# Patient Record
Sex: Male | Born: 1955 | Race: Black or African American | Hispanic: No | Marital: Married | Smoking: Former smoker
Health system: Southern US, Community
[De-identification: ages and names within clinical notes are randomized; demographics above are authoritative.]

## PROBLEM LIST (undated history)

## (undated) DIAGNOSIS — I10 Essential (primary) hypertension: Secondary | ICD-10-CM

## (undated) DIAGNOSIS — Z860100 Personal history of colon polyps, unspecified: Secondary | ICD-10-CM

## (undated) DIAGNOSIS — N529 Male erectile dysfunction, unspecified: Secondary | ICD-10-CM

## (undated) DIAGNOSIS — G4700 Insomnia, unspecified: Secondary | ICD-10-CM

## (undated) DIAGNOSIS — E785 Hyperlipidemia, unspecified: Secondary | ICD-10-CM

## (undated) HISTORY — DX: Hyperlipidemia, unspecified: E78.5

## (undated) HISTORY — DX: Male erectile dysfunction, unspecified: N52.9

## (undated) HISTORY — DX: Insomnia, unspecified: G47.00

## (undated) HISTORY — DX: Essential (primary) hypertension: I10

---

## 2010-10-26 ENCOUNTER — Emergency Department: Payer: Self-pay | Admitting: Emergency Medicine

## 2015-03-06 ENCOUNTER — Encounter: Payer: Self-pay | Admitting: Family Medicine

## 2015-03-06 ENCOUNTER — Ambulatory Visit (INDEPENDENT_AMBULATORY_CARE_PROVIDER_SITE_OTHER): Payer: 59 | Admitting: Family Medicine

## 2015-03-06 VITALS — BP 140/80 | HR 99 | Temp 98.3°F | Resp 18 | Ht 73.0 in | Wt 180.0 lb

## 2015-03-06 DIAGNOSIS — R05 Cough: Secondary | ICD-10-CM

## 2015-03-06 DIAGNOSIS — R058 Other specified cough: Secondary | ICD-10-CM | POA: Insufficient documentation

## 2015-03-06 MED ORDER — GUAIFENESIN-CODEINE 100-10 MG/5ML PO SYRP: 10.0000 mL | ORAL_SOLUTION | Freq: Every evening | ORAL | Status: DC | PRN

## 2015-03-06 MED ORDER — AZITHROMYCIN 250 MG PO TABS: ORAL_TABLET | ORAL | Status: DC

## 2015-03-06 NOTE — Progress Notes (Signed)
Name: Charles Valentine   MRN: [id number]    DOB: 02-04-1956   Date:03/06/2015       Progress Note  Subjective  Chief Complaint  Chief Complaint  Patient presents with  . Acute Visit    Upper Respiratory (Dr. Rutherford Nail Pt)    Cough This is a new problem. The current episode started in the past 7 days. The problem has been unchanged. The cough is productive of sputum. Associated symptoms include nasal congestion. Pertinent negatives include no chest pain, chills, ear congestion, fever or sore throat. Treatments tried: Robitussin COngestion and COugh, Alka-Seltzer Cold, and Mucinex, The treatment provided mild relief. There is no history of COPD.    History reviewed. No pertinent past medical history.  History reviewed. No pertinent past surgical history.  History reviewed. No pertinent family history.  Social History   Social History  . Marital Status: Married    Spouse Name: N/A  . Number of Children: N/A  . Years of Education: N/A   Occupational History  . Not on file.   Social History Main Topics  . Smoking status: Never Smoker   . Smokeless tobacco: Never Used  . Alcohol Use: 0.0 oz/week    0 Standard drinks or equivalent per week     Comment: occasional  . Drug Use: No  . Sexual Activity: No   Other Topics Concern  . Not on file   Social History Narrative  . No narrative on file     Current outpatient prescriptions:  .  CIALIS 20 MG tablet, , Disp: , Rfl: 1  No Known Allergies   Review of Systems  Constitutional: Negative for fever and chills.  HENT: Positive for congestion. Negative for sore throat.   Respiratory: Positive for cough and sputum production.   Cardiovascular: Negative for chest pain.    Objective  Filed Vitals:   03/06/15 1054  BP: 140/80  Pulse: 99  Temp: 98.3 F (36.8 C)  TempSrc: Oral  Resp: 18  Height: 6\' 1"  (1.854 m)  Weight: 180 lb (81.647 kg)  SpO2: 98%    Physical Exam  Constitutional: He is well-developed,  well-nourished, and in no distress.  HENT:  Head: Normocephalic and atraumatic.  Right Ear: Tympanic membrane and ear canal normal.  Left Ear: Tympanic membrane and ear canal normal.  Nose: Right sinus exhibits no maxillary sinus tenderness and no frontal sinus tenderness. Left sinus exhibits no maxillary sinus tenderness and no frontal sinus tenderness.  Mouth/Throat: Mucous membranes are normal. No posterior oropharyngeal erythema.  Cardiovascular: Normal rate, regular rhythm and normal heart sounds.   No murmur heard. Pulmonary/Chest: Effort normal and breath sounds normal. He has no wheezes.  Nursing note and vitals reviewed.   Assessment & Plan  1. Productive cough Likely resolving bronchitis. Given the duration of symptoms, we will start antibiotic therapy. Started on Cheratussin for relief from coughing at night. - azithromycin (ZITHROMAX Z-PAK) 250 MG tablet; 2 tabs po day 1, then 1 tab po q day x 4 days  Dispense: 6 each; Refill: 0 - guaiFENesin-codeine (CHERATUSSIN AC) 100-10 MG/5ML syrup; Take 10 mLs by mouth at bedtime as needed for cough or congestion.  Dispense: 50 mL; Refill: 0   Madalen Gavin Asad A[facility] 03/06/2015 11:09 AM

## 2015-03-13 ENCOUNTER — Encounter: Payer: Self-pay | Admitting: Family Medicine

## 2015-03-13 ENCOUNTER — Ambulatory Visit (INDEPENDENT_AMBULATORY_CARE_PROVIDER_SITE_OTHER): Payer: 59 | Admitting: Family Medicine

## 2015-03-13 VITALS — BP 122/70 | HR 68 | Temp 97.2°F | Resp 18 | Ht 73.0 in | Wt 178.1 lb

## 2015-03-13 DIAGNOSIS — Z0000 Encounter for general adult medical examination without abnormal findings: Secondary | ICD-10-CM

## 2015-03-13 DIAGNOSIS — Z1211 Encounter for screening for malignant neoplasm of colon: Secondary | ICD-10-CM | POA: Diagnosis not present

## 2015-03-13 MED ORDER — CIALIS 20 MG PO TABS: 20.0000 mg | ORAL_TABLET | Freq: Every day | ORAL | Status: DC | PRN

## 2015-03-13 MED ORDER — ZOLPIDEM TARTRATE 10 MG PO TABS: 10.0000 mg | ORAL_TABLET | Freq: Every evening | ORAL | Status: DC | PRN

## 2015-03-13 NOTE — Progress Notes (Signed)
Name: Charles Valentine   MRN: [id number]    DOB: 05-03-1955   Date:03/13/2015       Progress Note  Subjective  Chief Complaint  Chief Complaint  Patient presents with  . Annual Exam    HPI  59 year old presenting for H&P. Baseline problems are stable  History reviewed. No pertinent past medical history.  Social History  Substance Use Topics  . Smoking status: Never Smoker   . Smokeless tobacco: Never Used  . Alcohol Use: 0.0 oz/week    0 Standard drinks or equivalent per week     Comment: occasional     Current outpatient prescriptions:  .  CIALIS 20 MG tablet, , Disp: , Rfl: 1 .  azithromycin (ZITHROMAX Z-PAK) 250 MG tablet, 2 tabs po day 1, then 1 tab po q day x 4 days (Patient not taking: Reported on 03/13/2015), Disp: 6 each, Rfl: 0 .  guaiFENesin-codeine (CHERATUSSIN AC) 100-10 MG/5ML syrup, Take 10 mLs by mouth at bedtime as needed for cough or congestion. (Patient not taking: Reported on 03/13/2015), Disp: 50 mL, Rfl: 0  No Known Allergies  Review of Systems  Constitutional: Negative for fever, chills and weight loss.  HENT: Negative for congestion, hearing loss, sore throat and tinnitus.   Eyes: Negative for blurred vision, double vision and redness.  Respiratory: Negative for cough, hemoptysis and shortness of breath.   Cardiovascular: Negative for chest pain, palpitations, orthopnea, claudication and leg swelling.  Gastrointestinal: Negative for heartburn, nausea, vomiting, diarrhea, constipation and blood in stool.  Genitourinary: Negative for dysuria, urgency, frequency and hematuria.  Musculoskeletal: Negative for myalgias, back pain, joint pain, falls and neck pain.  Skin: Negative for itching.  Neurological: Negative for dizziness, tingling, tremors, focal weakness, seizures, loss of consciousness, weakness and headaches.  Endo/Heme/Allergies: Does not bruise/bleed easily.  Psychiatric/Behavioral: Negative for depression and substance abuse. The patient  is not nervous/anxious and does not have insomnia.      Objective  Filed Vitals:   03/13/15 0935  BP: 122/70  Pulse: 68  Temp: 97.2 F (36.2 C)  TempSrc: Oral  Resp: 18  Height: 6\' 1"  (1.854 m)  Weight: 178 lb 1.6 oz (80.786 kg)  SpO2: 97%     Physical Exam  Constitutional: He is oriented to person, place, and time and well-developed, well-nourished, and in no distress.  HENT:  Head: Normocephalic.  Eyes: EOM are normal. Pupils are equal, round, and reactive to light.  Neck: Normal range of motion. Neck supple. No thyromegaly present.  Cardiovascular: Normal rate, regular rhythm and normal heart sounds.   No murmur heard. Pulmonary/Chest: Effort normal and breath sounds normal. No respiratory distress. He has no wheezes.  Abdominal: Soft. Bowel sounds are normal.  Genitourinary: Rectum normal, prostate normal and penis normal. Guaiac negative stool. No discharge found.  Musculoskeletal: Normal range of motion. He exhibits no edema.  Lymphadenopathy:    He has no cervical adenopathy.  Neurological: He is alert and oriented to person, place, and time. No cranial nerve deficit. Gait normal. Coordination normal.  Skin: Skin is warm and dry. No rash noted.  Psychiatric: Affect and judgment normal.      Assessment & Plan    1. Annual physical exam  - CBC with Differential/Platelet; Future - Comprehensive metabolic panel - Lipid panel - PSA; Future - TSH - POC Hemoccult Bld/Stl (1-Cd Office Dx)  2. Colon cancer screening His colonoscopy  - Ambulatory referral to Colorectal Surgery

## 2015-03-14 LAB — COMPREHENSIVE METABOLIC PANEL
A/G RATIO: 1.2 (ref 1.1–2.5)
ALT: 25 IU/L (ref 0–44)
AST: 21 IU/L (ref 0–40)
Albumin: 4.6 g/dL (ref 3.5–5.5)
Alkaline Phosphatase: 81 IU/L (ref 39–117)
BILIRUBIN TOTAL: 0.3 mg/dL (ref 0.0–1.2)
BUN/Creatinine Ratio: 13 (ref 9–20)
BUN: 12 mg/dL (ref 6–24)
CALCIUM: 9.9 mg/dL (ref 8.7–10.2)
CHLORIDE: 99 mmol/L (ref 97–106)
CO2: 23 mmol/L (ref 18–29)
Creatinine, Ser: 0.9 mg/dL (ref 0.76–1.27)
GFR, EST AFRICAN AMERICAN: 109 mL/min/{1.73_m2} (ref >59)
GFR, EST NON AFRICAN AMERICAN: 94 mL/min/{1.73_m2} (ref >59)
GLOBULIN, TOTAL: 3.7 g/dL (ref 1.5–4.5)
Glucose: 89 mg/dL (ref 65–99)
POTASSIUM: 5.1 mmol/L (ref 3.5–5.2)
SODIUM: 138 mmol/L (ref 136–144)
TOTAL PROTEIN: 8.3 g/dL (ref 6.0–8.5)

## 2015-03-14 LAB — LIPID PANEL
CHOL/HDL RATIO: 4 ratio (ref 0.0–5.0)
Cholesterol, Total: 203 mg/dL — ABNORMAL HIGH (ref 100–199)
HDL: 51 mg/dL (ref >39)
LDL Calculated: 136 mg/dL — ABNORMAL HIGH (ref 0–99)
Triglycerides: 81 mg/dL (ref 0–149)
VLDL Cholesterol Cal: 16 mg/dL (ref 5–40)

## 2015-03-14 LAB — TSH: TSH: 1.17 u[IU]/mL (ref 0.450–4.500)

## 2015-03-20 ENCOUNTER — Telehealth: Payer: Self-pay | Admitting: Emergency Medicine

## 2015-03-20 MED ORDER — CIALIS 20 MG PO TABS: 20.0000 mg | ORAL_TABLET | Freq: Every day | ORAL | Status: DC | PRN

## 2015-03-20 NOTE — Telephone Encounter (Signed)
Patient notified

## 2016-03-18 ENCOUNTER — Encounter: Payer: 59 | Admitting: Family Medicine

## 2016-04-07 ENCOUNTER — Ambulatory Visit: Payer: 59 | Admitting: Family Medicine

## 2016-04-08 ENCOUNTER — Ambulatory Visit (INDEPENDENT_AMBULATORY_CARE_PROVIDER_SITE_OTHER): Payer: 59 | Admitting: Family Medicine

## 2016-04-08 ENCOUNTER — Encounter: Payer: Self-pay | Admitting: Family Medicine

## 2016-04-08 VITALS — BP 138/68 | HR 84 | Temp 98.8°F | Resp 16 | Ht 73.0 in | Wt 177.4 lb

## 2016-04-08 DIAGNOSIS — E785 Hyperlipidemia, unspecified: Secondary | ICD-10-CM

## 2016-04-08 DIAGNOSIS — N528 Other male erectile dysfunction: Secondary | ICD-10-CM | POA: Diagnosis not present

## 2016-04-08 DIAGNOSIS — E7800 Pure hypercholesterolemia, unspecified: Secondary | ICD-10-CM | POA: Insufficient documentation

## 2016-04-08 DIAGNOSIS — N529 Male erectile dysfunction, unspecified: Secondary | ICD-10-CM | POA: Insufficient documentation

## 2016-04-08 DIAGNOSIS — F5101 Primary insomnia: Secondary | ICD-10-CM

## 2016-04-08 MED ORDER — CIALIS 20 MG PO TABS: 20.0000 mg | ORAL_TABLET | Freq: Every day | ORAL | 11 refills | Status: DC | PRN

## 2016-04-08 MED ORDER — ASPIRIN EC 81 MG PO TBEC: 81.0000 mg | DELAYED_RELEASE_TABLET | Freq: Every day | ORAL | 0 refills | Status: DC

## 2016-04-08 MED ORDER — THERA VITAL M PO TABS: 1.0000 | ORAL_TABLET | Freq: Every day | ORAL | 0 refills | Status: DC

## 2016-04-08 MED ORDER — ZOLPIDEM TARTRATE 10 MG PO TABS: 10.0000 mg | ORAL_TABLET | Freq: Every evening | ORAL | 0 refills | Status: DC | PRN

## 2016-04-08 NOTE — Progress Notes (Signed)
Name: Charles Valentine   MRN: [id number]    DOB: 1955-05-10   Date:04/08/2016       Progress Note  Subjective  Chief Complaint  Chief Complaint  Patient presents with  . Medication Refill    needs cialis  . Hyperlipidemia    HPI  ED: he states he has been taking Cialis for years, not for every intercourse. He usually does better when exercising and eating healthy. It helps having a better erection and lasts longer during intercourse. No side effects of medication  Insomnia: He is only taking Ambien seldom to fall and stay asleep, wife snores, 30 pills lasts him one year. No side effects , including no amnesia.  Hyperlipidemia: he is only on diet, tries to eat healthy and exercise, no chest pain or decrease in exercise tolerance.   Patient Active Problem List   Diagnosis Date Noted  . Hyperlipidemia LDL goal <130 04/08/2016  . ED (erectile dysfunction)     History reviewed. No pertinent surgical history.  Family History  Problem Relation Age of Onset  . Hypertension Mother   . Diabetes Mother   . Heart disease Mother   . Obesity Sister     Social History   Social History  . Marital status: Married    Spouse name: Vanita Ingles  . Number of children: 2  . Years of education: N/A   Occupational History  . machine operator [employer]   Social History Main Topics  . Smoking status: Former Smoker    Packs/day: 0.50    Years: 10.00    Types: Cigarettes    Quit date: 04/20/1997  . Smokeless tobacco: Never Used  . Alcohol use 0.0 oz/week     Comment: occasional  . Drug use: No  . Sexual activity: Yes   Other Topics Concern  . Not on file   Social History Narrative   Married    He has two grown son's   Works as Radio producer      Current Outpatient Prescriptions:  .  CIALIS 20 MG tablet, Take 1 tablet (20 mg total) by mouth daily as needed for erectile dysfunction., Disp: 6 tablet, Rfl: 11 .  zolpidem (AMBIEN) 10 MG tablet, Take 1 tablet (10 mg total)  by mouth at bedtime as needed for sleep., Disp: 30 tablet, Rfl: 2  No Known Allergies   ROS  Constitutional: Negative for fever or weight change.  Respiratory: Negative for cough and shortness of breath.   Cardiovascular: Negative for chest pain or palpitations.  Gastrointestinal: Negative for abdominal pain, no bowel changes.  Musculoskeletal: Negative for gait problem or joint swelling.  Skin: Negative for rash.  Neurological: Negative for dizziness or headache.  No other specific complaints in a complete review of systems (except as listed in HPI above).  Objective  Vitals:   04/08/16 0944  BP: 138/68  Pulse: 84  Resp: 16  Temp: 98.8 F (37.1 C)  TempSrc: Oral  SpO2: 98%  Weight: 177 lb 7 oz (80.5 kg)  Height: 6\' 1"  (1.854 m)    Body mass index is 23.41 kg/m.  Physical Exam  Constitutional: Patient appears well-developed and well-nourished. No distress.  HEENT: head atraumatic, normocephalic, pupils equal and reactive to light, neck supple, throat within normal limits Cardiovascular: Normal rate, regular rhythm and normal heart sounds.  No murmur heard. No BLE edema. Pulmonary/Chest: Effort normal and breath sounds normal. No respiratory distress. Abdominal: Soft.  There is no tenderness. Psychiatric: Patient has a normal mood and  affect. behavior is normal. Judgment and thought content normal.  PHQ2/9: Depression screen [facility] 2/9 04/08/2016 03/13/2015 03/06/2015  Decreased Interest 0 0 0  Down, Depressed, Hopeless 0 0 0  PHQ - 2 Score 0 0 0     Fall Risk: Fall Risk  04/08/2016 03/13/2015 03/06/2015  Falls in the past year? No No No     Functional Status Survey: Is the patient deaf or have difficulty hearing?: No Does the patient have difficulty seeing, even when wearing glasses/contacts?: No Does the patient have difficulty concentrating, remembering, or making decisions?: No Does the patient have difficulty walking or climbing stairs?: No Does the  patient have difficulty dressing or bathing?: No Does the patient have difficulty doing errands alone such as visiting a doctor's office or shopping?: No   Assessment & Plan  1. Other male erectile dysfunction  - CIALIS 20 MG tablet; Take 1 tablet (20 mg total) by mouth daily as needed for erectile dysfunction.  Dispense: 6 tablet; Refill: 11 Discussed importance of telling EMS if he has chest pain and has taken Cialis  2. Hyperlipidemia LDL goal <130  Recheck on his CPE in Feb  3. Primary insomnia  - zolpidem (AMBIEN) 10 MG tablet; Take 1 tablet (10 mg total) by mouth at bedtime as needed for sleep.  Dispense: 30 tablet; Refill: 0

## 2016-06-08 ENCOUNTER — Ambulatory Visit (INDEPENDENT_AMBULATORY_CARE_PROVIDER_SITE_OTHER): Payer: 59 | Admitting: Family Medicine

## 2016-06-08 ENCOUNTER — Encounter: Payer: Self-pay | Admitting: Family Medicine

## 2016-06-08 VITALS — BP 134/78 | HR 79 | Temp 98.1°F | Resp 16 | Ht 73.0 in | Wt 172.1 lb

## 2016-06-08 DIAGNOSIS — Z125 Encounter for screening for malignant neoplasm of prostate: Secondary | ICD-10-CM | POA: Diagnosis not present

## 2016-06-08 DIAGNOSIS — Z1211 Encounter for screening for malignant neoplasm of colon: Secondary | ICD-10-CM | POA: Diagnosis not present

## 2016-06-08 DIAGNOSIS — R9431 Abnormal electrocardiogram [ECG] [EKG]: Secondary | ICD-10-CM | POA: Diagnosis not present

## 2016-06-08 DIAGNOSIS — E785 Hyperlipidemia, unspecified: Secondary | ICD-10-CM

## 2016-06-08 DIAGNOSIS — Z0000 Encounter for general adult medical examination without abnormal findings: Secondary | ICD-10-CM | POA: Diagnosis not present

## 2016-06-08 DIAGNOSIS — L309 Dermatitis, unspecified: Secondary | ICD-10-CM | POA: Insufficient documentation

## 2016-06-08 LAB — CBC WITH DIFFERENTIAL/PLATELET
BASOS ABS: 0 {cells}/uL (ref 0–200)
Basophils Relative: 0 %
Eosinophils Absolute: 69 cells/uL (ref 15–500)
Eosinophils Relative: 1 %
HEMATOCRIT: 44.4 % (ref 38.5–50.0)
HEMOGLOBIN: 14.7 g/dL (ref 13.2–17.1)
Lymphocytes Relative: 23 %
Lymphs Abs: 1587 cells/uL (ref 850–3900)
MCH: 29.3 pg (ref 27.0–33.0)
MCHC: 33.1 g/dL (ref 32.0–36.0)
MCV: 88.4 fL (ref 80.0–100.0)
MPV: 9.6 fL (ref 7.5–12.5)
Monocytes Absolute: 552 cells/uL (ref 200–950)
Monocytes Relative: 8 %
NEUTROS ABS: 4692 {cells}/uL (ref 1500–7800)
NEUTROS PCT: 68 %
Platelets: 263 10*3/uL (ref 140–400)
RBC: 5.02 MIL/uL (ref 4.20–5.80)
RDW: 13.6 % (ref 11.0–15.0)
WBC: 6.9 10*3/uL (ref 3.8–10.8)

## 2016-06-08 LAB — HEMOGLOBIN A1C
Hgb A1c MFr Bld: 5.5 % (ref <5.7)
MEAN PLASMA GLUCOSE: 111 mg/dL

## 2016-06-08 NOTE — Progress Notes (Signed)
Name: Charles Valentine   MRN: [id number]    DOB: 24-Aug-1955   Date:06/08/2016       Progress Note  Subjective  Chief Complaint  Chief Complaint  Patient presents with  . Annual Exam    HPI  Male Exam: he is eating healthy, he is very active at work and walks 10000 steps daily. He denies SOB, chest pain or change in exercise tolerance. He is married and sexually active. ED is controlled with Cialis. No side effects of medication  IPSS Questionnaire (AUA-7): Over the past month.   1)  How often have you had a sensation of not emptying your bladder completely after you finish urinating?  0 - Not at all  2)  How often have you had to urinate again less than two hours after you finished urinating? 0 - Not at all  3)  How often have you found you stopped and started again several times when you urinated?  0 - Not at all  4) How difficult have you found it to postpone urination?  1 - Less than 1 time in 5  5) How often have you had a weak urinary stream?  1 - Less than 1 time in 5  6) How often have you had to push or strain to begin urination?  0 - Not at all  7) How many times did you most typically get up to urinate from the time you went to bed until the time you got up in the morning?  1 - 1 time  Total score:  0-7 mildly symptomatic   8-19 moderately symptomatic   20-35 severely symptomatic    Patient Active Problem List   Diagnosis Date Noted  . Eczema 06/08/2016  . Hyperlipidemia LDL goal <130 04/08/2016  . ED (erectile dysfunction)     History reviewed. No pertinent surgical history.  Family History  Problem Relation Age of Onset  . Hypertension Mother   . Diabetes Mother   . Heart disease Mother   . Obesity Sister     Social History   Social History  . Marital status: Married    Spouse name: Vanita Ingles  . Number of children: 2  . Years of education: N/A   Occupational History  . machine operator [employer]   Social History Main Topics  . Smoking status:  Former Smoker    Packs/day: 0.50    Years: 10.00    Types: Cigarettes    Quit date: 04/20/1997  . Smokeless tobacco: Never Used  . Alcohol use 0.0 oz/week     Comment: occasional  . Drug use: No  . Sexual activity: Yes   Other Topics Concern  . Not on file   Social History Narrative   Married    He has two grown son's   Works as Radio producer      Current Outpatient Prescriptions:  .  aspirin EC 81 MG tablet, Take 1 tablet (81 mg total) by mouth daily., Disp: 30 tablet, Rfl: 0 .  CIALIS 20 MG tablet, Take 1 tablet (20 mg total) by mouth daily as needed for erectile dysfunction., Disp: 6 tablet, Rfl: 11 .  Multiple Vitamins-Minerals (MULTIVITAMIN) tablet, Take 1 tablet by mouth daily., Disp: 30 tablet, Rfl: 0  No Known Allergies   ROS  Constitutional: Negative for fever or weight change.  Respiratory: Negative for cough and shortness of breath.   Cardiovascular: Negative for chest pain or palpitations.  Gastrointestinal: Negative for abdominal pain, no bowel changes.  Musculoskeletal: Negative for gait problem or joint swelling.  Skin: Negative for rash.  Neurological: Negative for dizziness or headache.  No other specific complaints in a complete review of systems (except as listed in HPI above).  Objective  Vitals:   06/08/16 1056  BP: 134/78  Pulse: 79  Resp: 16  Temp: 98.1 F (36.7 C)  SpO2: 97%  Weight: 172 lb 1 oz (78 kg)  Height: 6\' 1"  (1.854 m)    Body mass index is 22.7 kg/m.  Physical Exam  Constitutional: Patient appears well-developed and well-nourished. No distress.  HENT: Head: Normocephalic and atraumatic. Ears: B TMs ok, no erythema or effusion; Nose: Nose normal. Mouth/Throat: Oropharynx is clear and moist. No oropharyngeal exudate.  Eyes: Conjunctivae and EOM are normal. Pupils are equal, round, and reactive to light. No scleral icterus.  Neck: Normal range of motion. Neck supple. No JVD present. No thyromegaly present.   Cardiovascular: Normal rate, regular rhythm and normal heart sounds.  No murmur heard. No BLE edema. Pulmonary/Chest: Effort normal and breath sounds normal. No respiratory distress. Abdominal: Soft. Bowel sounds are normal, no distension. There is no tenderness. no masses MALE GENITALIA: Normal descended testes bilaterally, no masses palpated, no hernias, no lesions, no discharge RECTAL: Prostate normal size and consistency, no rectal masses or hemorrhoids Musculoskeletal: Normal range of motion, no joint effusions. No gross deformities Neurological: he is alert and oriented to person, place, and time. No cranial nerve deficit. Coordination, balance, strength, speech and gait are normal.  Skin: Skin is warm and dry.he has a rash on ventral aspect of penis ( he states it got caught on his zipper ) also some erythema of scalp but he states she shaved this morning Psychiatric: Patient has a normal mood and affect. behavior is normal. Judgment and thought content normal.   PHQ2/9: Depression screen [facility] 2/9 06/08/2016 04/08/2016 03/13/2015 03/06/2015  Decreased Interest 0 0 0 0  Down, Depressed, Hopeless 0 0 0 0  PHQ - 2 Score 0 0 0 0     Fall Risk: Fall Risk  06/08/2016 04/08/2016 03/13/2015 03/06/2015  Falls in the past year? No No No No     Functional Status Survey: Is the patient deaf or have difficulty hearing?: No Does the patient have difficulty seeing, even when wearing glasses/contacts?: No Does the patient have difficulty concentrating, remembering, or making decisions?: No Does the patient have difficulty walking or climbing stairs?: No Does the patient have difficulty dressing or bathing?: No Does the patient have difficulty doing errands alone such as visiting a doctor's office or shopping?: No   Assessment & Plan  1. Encounter for routine history and physical exam for male  Discussed importance of 150 minutes of physical activity weekly, eat two servings of fish  weekly, eat one serving of tree nuts ( cashews, pistachios, pecans, almonds.Marland Kitchen) every other day, eat 6 servings of fruit/vegetables daily and drink plenty of water and avoid sweet beverages.  - Comprehensive metabolic panel - Vitamin 123456 - VITAMIN D 25 Hydroxy (Vit-D Deficiency, Fractures) - TSH - CBC with Differential/Platelet - Hemoglobin A1c - Insulin, fasting  2. Colon cancer screening  - Ambulatory referral to Gastroenterology  3. Hyperlipidemia LDL goal <130  - Lipid panel  4. Abnormal finding on EKG  - Ambulatory referral to Cardiology  5. Screening for prostate cancer  - PSA

## 2016-06-09 LAB — VITAMIN D 25 HYDROXY (VIT D DEFICIENCY, FRACTURES): Vit D, 25-Hydroxy: 32 ng/mL (ref 30–100)

## 2016-06-09 LAB — VITAMIN B12: Vitamin B-12: 330 pg/mL (ref 200–1100)

## 2016-06-09 LAB — COMPREHENSIVE METABOLIC PANEL
ALBUMIN: 4.5 g/dL (ref 3.6–5.1)
ALT: 21 U/L (ref 9–46)
AST: 20 U/L (ref 10–35)
Alkaline Phosphatase: 55 U/L (ref 40–115)
BUN: 13 mg/dL (ref 7–25)
CALCIUM: 9.8 mg/dL (ref 8.6–10.3)
CHLORIDE: 104 mmol/L (ref 98–110)
CO2: 25 mmol/L (ref 20–31)
Creat: 0.98 mg/dL (ref 0.70–1.33)
GLUCOSE: 86 mg/dL (ref 65–99)
Potassium: 5 mmol/L (ref 3.5–5.3)
SODIUM: 139 mmol/L (ref 135–146)
Total Bilirubin: 0.5 mg/dL (ref 0.2–1.2)
Total Protein: 8 g/dL (ref 6.1–8.1)

## 2016-06-09 LAB — PSA: PSA: 0.7 ng/mL (ref <=4.0)

## 2016-06-09 LAB — LIPID PANEL
CHOL/HDL RATIO: 3.9 ratio (ref <5.0)
Cholesterol: 213 mg/dL — ABNORMAL HIGH (ref <200)
HDL: 54 mg/dL (ref >40)
LDL CALC: 146 mg/dL — AB (ref <100)
Triglycerides: 65 mg/dL (ref <150)
VLDL: 13 mg/dL (ref <30)

## 2016-06-09 LAB — INSULIN, FASTING: INSULIN FASTING, SERUM: 3.1 u[IU]/mL (ref 2.0–19.6)

## 2016-06-09 LAB — TSH: TSH: 0.98 mIU/L (ref 0.40–4.50)

## 2016-06-15 ENCOUNTER — Other Ambulatory Visit: Payer: Self-pay

## 2016-06-17 ENCOUNTER — Telehealth: Payer: Self-pay

## 2016-06-17 ENCOUNTER — Other Ambulatory Visit: Payer: Self-pay

## 2016-06-17 DIAGNOSIS — Z860100 Personal history of colon polyps, unspecified: Secondary | ICD-10-CM

## 2016-06-17 NOTE — Telephone Encounter (Signed)
Gastroenterology Pre-Procedure Review  Request Date:  Requesting Physician: Dr.   PATIENT REVIEW QUESTIONS: The patient responded to the following health history questions as indicated:    1. Are you having any GI issues? no 2. Do you have a personal history of Polyps? yes (Hx of colon polyps) 3. Do you have a family history of Colon Cancer or Polyps? yes (Grandmother) 4. Diabetes Mellitus? no 5. Joint replacements in the past 12 months?no 6. Major health problems in the past 3 months?no 7. Any artificial heart valves, MVP, or defibrillator?no    MEDICATIONS & ALLERGIES:    Patient reports the following regarding taking any anticoagulation/antiplatelet therapy:   Plavix, Coumadin, Eliquis, Xarelto, Lovenox, Pradaxa, Brilinta, or Effient? no Aspirin? no  Patient confirms/reports the following medications:  Current Outpatient Prescriptions  Medication Sig Dispense Refill  . aspirin EC 81 MG tablet Take 1 tablet (81 mg total) by mouth daily. 30 tablet 0  . CIALIS 20 MG tablet Take 1 tablet (20 mg total) by mouth daily as needed for erectile dysfunction. 6 tablet 11  . Multiple Vitamins-Minerals (MULTIVITAMIN) tablet Take 1 tablet by mouth daily. 30 tablet 0   No current facility-administered medications for this visit.     Patient confirms/reports the following allergies:  No Known Allergies  No orders of the defined types were placed in this encounter.   AUTHORIZATION INFORMATION Primary Insurance: 1D#: Group #:  Secondary Insurance: 1D#: Group #:  SCHEDULE INFORMATION: Date: 07/03/16 Time: Location: [place]

## 2016-06-23 ENCOUNTER — Ambulatory Visit: Payer: Self-pay | Admitting: Internal Medicine

## 2016-07-02 ENCOUNTER — Encounter: Payer: Self-pay | Admitting: *Deleted

## 2016-07-03 ENCOUNTER — Encounter: Payer: Self-pay | Admitting: Certified Registered"

## 2016-07-03 ENCOUNTER — Ambulatory Visit
Admission: RE | Admit: 2016-07-03 | Discharge: 2016-07-03 | Disposition: A | Discharge status: Home / Self Care | Payer: 59 | Source: Ambulatory Visit | Attending: Gastroenterology | Admitting: Gastroenterology

## 2016-07-03 ENCOUNTER — Emergency Department: Payer: 59

## 2016-07-03 ENCOUNTER — Encounter
Admission: RE | Disposition: A | Discharge status: Still In Hospital | Payer: Self-pay | Source: Ambulatory Visit | Attending: Gastroenterology

## 2016-07-03 ENCOUNTER — Encounter: Payer: Self-pay | Admitting: Emergency Medicine

## 2016-07-03 ENCOUNTER — Emergency Department
Admission: EM | Admit: 2016-07-03 | Discharge: 2016-07-03 | Disposition: A | Discharge status: Home / Self Care | Payer: 59 | Source: Home / Self Care | Attending: Emergency Medicine | Admitting: Emergency Medicine

## 2016-07-03 DIAGNOSIS — F419 Anxiety disorder, unspecified: Secondary | ICD-10-CM

## 2016-07-03 DIAGNOSIS — E860 Dehydration: Secondary | ICD-10-CM

## 2016-07-03 DIAGNOSIS — Z87891 Personal history of nicotine dependence: Secondary | ICD-10-CM | POA: Insufficient documentation

## 2016-07-03 DIAGNOSIS — Z5309 Procedure and treatment not carried out because of other contraindication: Secondary | ICD-10-CM | POA: Insufficient documentation

## 2016-07-03 DIAGNOSIS — Z1211 Encounter for screening for malignant neoplasm of colon: Secondary | ICD-10-CM | POA: Insufficient documentation

## 2016-07-03 DIAGNOSIS — Z7982 Long term (current) use of aspirin: Secondary | ICD-10-CM

## 2016-07-03 DIAGNOSIS — Z86010 Personal history of colonic polyps: Secondary | ICD-10-CM

## 2016-07-03 DIAGNOSIS — Z79899 Other long term (current) drug therapy: Secondary | ICD-10-CM

## 2016-07-03 DIAGNOSIS — R000 Tachycardia, unspecified: Secondary | ICD-10-CM

## 2016-07-03 HISTORY — DX: Personal history of colon polyps, unspecified: Z86.0100

## 2016-07-03 HISTORY — DX: Personal history of colonic polyps: Z86.010

## 2016-07-03 LAB — BASIC METABOLIC PANEL
Anion gap: 14 (ref 5–15)
BUN: 14 mg/dL (ref 6–20)
CO2: 22 mmol/L (ref 22–32)
Calcium: 9.4 mg/dL (ref 8.9–10.3)
Chloride: 98 mmol/L — ABNORMAL LOW (ref 101–111)
Creatinine, Ser: 1.03 mg/dL (ref 0.61–1.24)
GFR calc Af Amer: >60 mL/min (ref >60)
GLUCOSE: 88 mg/dL (ref 65–99)
POTASSIUM: 3.8 mmol/L (ref 3.5–5.1)
SODIUM: 134 mmol/L — AB (ref 135–145)

## 2016-07-03 LAB — CBC
HEMATOCRIT: 47.8 % (ref 40.0–52.0)
Hemoglobin: 16.4 g/dL (ref 13.0–18.0)
MCH: 30.1 pg (ref 26.0–34.0)
MCHC: 34.3 g/dL (ref 32.0–36.0)
MCV: 87.9 fL (ref 80.0–100.0)
Platelets: 300 10*3/uL (ref 150–440)
RBC: 5.45 MIL/uL (ref 4.40–5.90)
RDW: 13.3 % (ref 11.5–14.5)
WBC: 14.9 10*3/uL — ABNORMAL HIGH (ref 3.8–10.6)

## 2016-07-03 LAB — TROPONIN I
Troponin I: <0.03 ng/mL (ref <0.03)
Troponin I: <0.03 ng/mL (ref <0.03)

## 2016-07-03 LAB — TSH: TSH: 1.593 u[IU]/mL (ref 0.350–4.500)

## 2016-07-03 SURGERY — COLONOSCOPY WITH PROPOFOL
Anesthesia: General

## 2016-07-03 MED ORDER — SODIUM CHLORIDE 0.9 % IV BOLUS (SEPSIS)
1000.0000 mL | Freq: Once | INTRAVENOUS | Status: AC
  Administered 2016-07-03: 1000 mL via INTRAVENOUS

## 2016-07-03 MED ORDER — SODIUM CHLORIDE 0.9 % IV SOLN: INTRAVENOUS | Status: DC

## 2016-07-03 NOTE — Discharge Instructions (Signed)
Return to the ER if you have palpitations, chest pain, shortness of breath, or any new symptoms concerning to you. Otherwise follow up with your doctor in 1-2 days.

## 2016-07-03 NOTE — Progress Notes (Signed)
In Pre op Endoscopy for Colonoscopy. Noted heart rate of 132 during Vital sign assessment. Pulse felt irregular. Dr. :Piiscitello notified and 3 lead rhythm strip obtained, which shows Sinus tach. Dr. Vicente Males notified and upon collaboration with patient decision made to cancel the colonoscopy and send patient to ED. He was scheduled to see a cardiologist but has not seen the cardiologist yet. Wife present and notified of plan. Patient taken to ED via wheelchair. ED charge nurse notified.

## 2016-07-03 NOTE — ED Notes (Signed)
PAtient reports when he got to colonoscopy appointment, staff informed him his HR was high in 120s. He reported to this RN this made him a little nervous and he believes it made his HR go up higher. Upon arrival to ER patients HR has remained in 90s. Pt denies SOB or Chest pain

## 2016-07-03 NOTE — ED Provider Notes (Signed)
[facility] Emergency Department Provider Note  ____________________________________________  Time seen: Approximately 12:45 PM  I have reviewed the triage vital signs and the nursing notes.   HISTORY  Chief Complaint Tachycardia   HPI Charles Valentine is a 61 y.o. male with a history of colon polyps, hyperlipidemia, and erectile dysfunction who presents for evaluation of tachycardia. Patient reports that he hasn't had anything to eat or drink since yesterday as he is being prepped for colonoscopy today. While waiting for his colonoscopy patient noted to be tachycardic. Patient reports that he was very anxious and have a history of anxiety. He denies palpitations, chest pain, shortness of breath, dizziness, abdominal pain, back pain, nausea, vomiting, diarrhea. When patient arrives to the emergency room and is also complaining of feeling very anxious for being sent down here. During my evaluation patient feels improved and is no longer tachycardic on telemetry. He denies prior history of tachycardia. He denies personal history of ischemic heart disease. He denies family history of blood clots, recent travel or immobilization, leg pain or swelling, hemoptysis, or exogenous hormones.  Past Medical History:  Diagnosis Date  . ED (erectile dysfunction)   . History of colon polyps   . Hyperlipidemia   . Insomnia     Patient Active Problem List   Diagnosis Date Noted  . Eczema 06/08/2016  . Hyperlipidemia LDL goal <130 04/08/2016  . ED (erectile dysfunction)     History reviewed. No pertinent surgical history.  Prior to Admission medications   Medication Sig Start Date End Date Taking? Authorizing Provider  aspirin EC 81 MG tablet Take 1 tablet (81 mg total) by mouth daily. 04/08/16  Yes Steele Sizer, MD  CIALIS 20 MG tablet Take 1 tablet (20 mg total) by mouth daily as needed for erectile dysfunction. 04/08/16  Yes Steele Sizer, MD  Multiple  Vitamins-Minerals (MULTIVITAMIN) tablet Take 1 tablet by mouth daily. 04/08/16  Yes Steele Sizer, MD    Allergies Patient has no known allergies.  Family History  Problem Relation Age of Onset  . Hypertension Mother   . Diabetes Mother   . Heart disease Mother   . Obesity Sister     Social History Social History  Substance Use Topics  . Smoking status: Former Smoker    Packs/day: 0.50    Years: 10.00    Types: Cigarettes    Quit date: 04/20/1997  . Smokeless tobacco: Never Used  . Alcohol use 0.0 oz/week     Comment: occasional    Review of Systems Constitutional: Negative for fever. Eyes: Negative for visual changes. ENT: Negative for sore throat. Neck: No neck pain  Cardiovascular: Negative for chest pain. + tachycardia Respiratory: Negative for shortness of breath. Gastrointestinal: Negative for abdominal pain, vomiting or diarrhea. Genitourinary: Negative for dysuria. Musculoskeletal: Negative for back pain. Skin: Negative for rash. Neurological: Negative for headaches, weakness or numbness. Psych: No SI or HI  ____________________________________________   PHYSICAL EXAM:  VITAL SIGNS: ED Triage Vitals  Enc Vitals Group     BP 07/03/16 1003 (!) 169/96     Pulse Rate 07/03/16 1003 (!) 115     Resp 07/03/16 1003 20     Temp 07/03/16 1003 97.6 F (36.4 C)     Temp Source 07/03/16 1003 Oral     SpO2 07/03/16 1003 98 %     Weight 07/03/16 1004 172 lb (78 kg)     Height 07/03/16 1004 6\' 1"  (1.854 m)     Head Circumference --  Peak Flow --      Pain Score 07/03/16 1011 0     Pain Loc --      Pain Edu? --      Excl. in [place]? --     Constitutional: Alert and oriented. Well appearing and in no apparent distress. HEENT:      Head: Normocephalic and atraumatic.         Eyes: Conjunctivae are normal. Sclera is non-icteric. EOMI. PERRL      Mouth/Throat: Mucous membranes are moist.       Neck: Supple with no signs of meningismus. Cardiovascular: Regular  rate and rhythm. No murmurs, gallops, or rubs. 2+ symmetrical distal pulses are present in all extremities. No JVD. Respiratory: Normal respiratory effort. Lungs are clear to auscultation bilaterally. No wheezes, crackles, or rhonchi.  Gastrointestinal: Soft, non tender, and non distended with positive bowel sounds. No rebound or guarding. Genitourinary: No CVA tenderness. Musculoskeletal: Nontender with normal range of motion in all extremities. No edema, cyanosis, or erythema of extremities. Neurologic: Normal speech and language. Face is symmetric. Moving all extremities. No gross focal neurologic deficits are appreciated. Skin: Skin is warm, dry and intact. No rash noted. Psychiatric: Mood and affect are normal. Speech and behavior are normal.  ____________________________________________   LABS (all labs ordered are listed, but only abnormal results are displayed)  Labs Reviewed  BASIC METABOLIC PANEL - Abnormal; Notable for the following:       Result Value   Sodium 134 (*)    Chloride 98 (*)    All other components within normal limits  CBC - Abnormal; Notable for the following:    WBC 14.9 (*)    All other components within normal limits  TROPONIN I  TSH  TROPONIN I   ____________________________________________  EKG  ED ECG REPORT I, Rudene Re, the attending physician, personally viewed and interpreted this ECG.  Sinus tachycardia, rate of 116, normal intervals, normal axis, no ST elevations or depressions. ____________________________________________  RADIOLOGY  CXR: Negative ____________________________________________   PROCEDURES  Procedure(s) performed: None Procedures Critical Care performed:  None ____________________________________________   INITIAL IMPRESSION / ASSESSMENT AND PLAN / ED COURSE  61 y.o. male with a history of colon polyps, hyperlipidemia, and erectile dysfunction who presents for evaluation of tachycardia in the setting of  12 hours of no eating or drinking, colonoscopy prep and anxiety. Since patient calmed down in the emergency department his heart rate went 80. Patient was completely asymptomatic while tachycardic with no palpitations, no dizziness, no chest pain or shortness of breath. His first troponin is negative. His chest x-ray is within normal limits. His blood work shows leukocytosis of 14,000 of unclear etiology. Patient has had no fever no chills, no cough, no dysuria, no diarrhea or vomiting. Suspect component of dehydration and anxiety. We'll get a second troponin and continue to monitor on telemetry.  Clinical Course as of Jul 04 1403  Fri Jul 03, 2016  1403 Blood work with no acute findings. Troponin times is negative. TSH is normal. Patient heart rate has been between 80 and 90 in the ED. We'll discharge home with close follow-up with primary care doctor.  [CV]    Clinical Course User Index [CV] Rudene Re, MD    Pertinent labs & imaging results that were available during my care of the patient were reviewed by me and considered in my medical decision making (see chart for details).    ____________________________________________   FINAL CLINICAL IMPRESSION(S) / ED DIAGNOSES  Final diagnoses:  Sinus tachycardia  Anxiety  Dehydration      NEW MEDICATIONS STARTED DURING THIS VISIT:  New Prescriptions   No medications on file     Note:  This document was prepared using Dragon voice recognition software and may include unintentional dictation errors.    Rudene Re, MD 07/03/16 [phone]

## 2016-07-03 NOTE — ED Triage Notes (Signed)
Pt sent down from ENDO today, pt was sch to have a colonoscopy today and staff reports heart rate was elevated at 120bpm.  Pt arrives to stat desk via wheelchair, denies any shortness of breath and NO chest pain.  Pt states he feels anxious now that they sent him down here to the ER.  NAD, skin warm and dry.

## 2016-07-16 ENCOUNTER — Ambulatory Visit (INDEPENDENT_AMBULATORY_CARE_PROVIDER_SITE_OTHER): Payer: 59 | Admitting: Internal Medicine

## 2016-07-16 ENCOUNTER — Encounter: Payer: Self-pay | Admitting: Internal Medicine

## 2016-07-16 VITALS — BP 180/100 | HR 106 | Ht 72.5 in | Wt 171.2 lb

## 2016-07-16 DIAGNOSIS — I10 Essential (primary) hypertension: Secondary | ICD-10-CM

## 2016-07-16 DIAGNOSIS — R000 Tachycardia, unspecified: Secondary | ICD-10-CM | POA: Diagnosis not present

## 2016-07-16 MED ORDER — AMLODIPINE BESYLATE 5 MG PO TABS: 5.0000 mg | ORAL_TABLET | Freq: Every day | ORAL | 3 refills | Status: DC

## 2016-07-16 NOTE — Patient Instructions (Signed)
Medication Instructions:  Your physician has recommended you make the following change in your medication:  1- START Amlodipine 5 mg (1 tablet) by mouth once a day.   Labwork: none  Testing/Procedures: Your physician has requested that you have an echocardiogram. Echocardiography is a painless test that uses sound waves to create images of your heart. It provides your doctor with information about the size and shape of your heart and how well your heart's chambers and valves are working. This procedure takes approximately one hour. There are no restrictions for this procedure.    Follow-Up: Your physician recommends that you schedule a follow-up appointment in: [place].   If you need a refill on your cardiac medications before your next appointment, please call your pharmacy.  Echocardiogram An echocardiogram, or echocardiography, uses sound waves (ultrasound) to produce an image of your heart. The echocardiogram is simple, painless, obtained within a short period of time, and offers valuable information to your health care provider. The images from an echocardiogram can provide information such as:  Evidence of coronary artery disease (CAD).  Heart size.  Heart muscle function.  Heart valve function.  Aneurysm detection.  Evidence of a past heart attack.  Fluid buildup around the heart.  Heart muscle thickening.  Assess heart valve function. Tell a health care provider about:  Any allergies you have.  All medicines you are taking, including vitamins, herbs, eye drops, creams, and over-the-counter medicines.  Any problems you or family members have had with anesthetic medicines.  Any blood disorders you have.  Any surgeries you have had.  Any medical conditions you have.  Whether you are pregnant or may be pregnant. What happens before the procedure? No special preparation is needed. Eat and drink normally. What happens during the procedure?  In  order to produce an image of your heart, gel will be applied to your chest and a wand-like tool (transducer) will be moved over your chest. The gel will help transmit the sound waves from the transducer. The sound waves will harmlessly bounce off your heart to allow the heart images to be captured in real-time motion. These images will then be recorded.  You may need an IV to receive a medicine that improves the quality of the pictures. What happens after the procedure? You may return to your normal schedule including diet, activities, and medicines, unless your health care provider tells you otherwise. This information is not intended to replace advice given to you by your health care provider. Make sure you discuss any questions you have with your health care provider. Document Released: 04/03/2000 Document Revised: 11/23/2015 Document Reviewed: 12/12/2012 Elsevier Interactive Patient Education  2017 [employer].

## 2016-07-16 NOTE — Progress Notes (Signed)
New Outpatient Visit Date: 07/16/2016  Referring Provider: Steele Sizer, MD [address] [place], [place] [address]  Chief Complaint: Fast heart beat  HPI:  [name] is seen today for evaluation of sinus tachycardia at the request of Dr. Ancil Boozer. He is a 61 y.o. year-old male with history of hyperlipidemia, elevated blood pressure, and erectile dysfunction. He was referred for colonoscopy, given history of colon polyps, and presented for this procedure to [facility] on 07/03/16. Upon arrival, he was noted to have an elevated heart rate, that ultimately led to cancellation of the colonoscopy and referral to the emergency department. Initial EKG showed sinus tachycardia with a rate of 116 bpm. Mr. [name] felt his heart beating more rapidly than usual, though he attributed this to being anxious about the colonoscopy. He denied palpitations, chest pain, shortness of breath, and lightheadedness. Workup in the ED was unremarkable. Patient was discharged home and subsequently referred to [place] for further evaluation by his PCP.  Mr. [name] denies a history of prior cardiac disease. 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 He has not had any symptoms or limitations with this including chest pain, shortness of breath, and lightheadedness. He typically consumes 20 ounces of caffeinated coffee per day. He does not use other stimulants. He monitors his heart rate at home intermittently using a Fick bit, and states that it is usually less than 100 bpm unless he is active or nervous. He reports some elevated blood pressure readings in the past, though he has never been diagnosed with hypertension.  --------------------------------------------------------------------------------------------------  Cardiovascular History & Procedures: Cardiovascular Problems:  Sinus tachycardia  Risk Factors:  Hyperlipidemia, elevated blood pressure, male gender, family history,  and age greater than 81  Cath/PCI:  None  CV Surgery:  None  EP Procedures and Devices:  None  Non-Invasive Evaluation(s):  None  Recent CV Pertinent Labs: Lab Results  Component Value Date   CHOL 213 (H) 06/08/2016   CHOL 203 (H) 03/13/2015   HDL 54 06/08/2016   HDL 51 03/13/2015   LDLCALC 146 (H) 06/08/2016   LDLCALC 136 (H) 03/13/2015   TRIG 65 06/08/2016   CHOLHDL 3.9 06/08/2016   K 3.8 07/03/2016   BUN 14 07/03/2016   BUN 12 03/13/2015   CREATININE 1.03 07/03/2016   CREATININE 0.98 06/08/2016    --------------------------------------------------------------------------------------------------  Past Medical History:  Diagnosis Date  . ED (erectile dysfunction)   . History of colon polyps   . Hyperlipidemia   . Insomnia     No past surgical history on file.  Outpatient Encounter Prescriptions as of 07/16/2016  Medication Sig  . aspirin EC 81 MG tablet Take 1 tablet (81 mg total) by mouth daily.  Marland Kitchen CIALIS 20 MG tablet Take 1 tablet (20 mg total) by mouth daily as needed for erectile dysfunction.  . Multiple Vitamins-Minerals (MULTIVITAMIN) tablet Take 1 tablet by mouth daily.   No facility-administered encounter medications on file as of 07/16/2016.     Allergies: Patient has no known allergies.  Social History   Social History  . Marital status: Married    Spouse name: Vanita Ingles  . Number of children: 2  . Years of education: N/A   Occupational History  . machine operator [employer]   Social History Main Topics  . Smoking status: Former Smoker    Packs/day: 0.50    Years: 10.00    Types: Cigarettes    Quit date: 1989  . Smokeless tobacco: Never  Used  . Alcohol use 4.8 oz/week    6 Cans of beer, 2 Shots of liquor per week  . Drug use: No  . Sexual activity: Yes   Other Topics Concern  . Not on file   Social History Narrative   Married    He has two grown son's   Works as Radio producer     Family History  Problem  Relation Age of Onset  . Hypertension Mother   . Diabetes Mother   . Heart disease Mother 41    CABG  . Heart attack Mother 58  . Obesity Sister   . Drug abuse Brother     Review of Systems: A 12-system review of systems was performed and was negative except as noted in the HPI.  --------------------------------------------------------------------------------------------------  Physical Exam: BP (!) 180/100 (BP Location: Right Arm, Patient Position: Sitting, Cuff Size: Normal)   Pulse (!) 106   Ht 6' 0.5" (1.842 m)   Wt 171 lb 4 oz (77.7 kg)   BMI 22.91 kg/m   Repeat heart rate: 78 bpm Repeat blood pressure: 180/90  General:  Well-developed, well-nourished man seated comfortably in the exam room. HEENT: No conjunctival pallor or scleral icterus.  Moist mucous membranes.  OP clear. Neck: Supple without lymphadenopathy, thyromegaly, JVD, or HJR.  No carotid bruit. Lungs: Normal work of breathing.  Clear to auscultation bilaterally without wheezes or crackles. Heart: Regular rate and rhythm without murmurs, rubs, or gallops.  Non-displaced PMI. Abd: Bowel sounds present.  Soft, NT/ND without hepatosplenomegaly Ext: No lower extremity edema.  Radial, PT, and DP pulses are 2+ bilaterally Skin: warm and dry without rash Neuro: CNIII-XII intact.  Strength and fine-touch sensation intact in upper and lower extremities bilaterally. Psych: Normal mood. Patient initially appeared somewhat anxious but seemed more relaxed later in the visit.  EKG:  Sinus tachycardia (heart rate 10 6 bpm). No other significant abnormalities. Heart rate has decreased from the previous tracing on 07/03/16. Otherwise, there has been no significant interval change (I have personally reviewed both tracings).  Lab Results  Component Value Date   WBC 14.9 (H) 07/03/2016   HGB 16.4 07/03/2016   HCT 47.8 07/03/2016   MCV 87.9 07/03/2016   PLT 300 07/03/2016    Lab Results  Component Value Date   NA 134 (L)  07/03/2016   K 3.8 07/03/2016   CL 98 (L) 07/03/2016   CO2 22 07/03/2016   BUN 14 07/03/2016   CREATININE 1.03 07/03/2016   GLUCOSE 88 07/03/2016   ALT 21 06/08/2016    Lab Results  Component Value Date   CHOL 213 (H) 06/08/2016   HDL 54 06/08/2016   LDLCALC 146 (H) 06/08/2016   TRIG 65 06/08/2016   CHOLHDL 3.9 06/08/2016   Lab Results  Component Value Date   TSH 1.593 07/03/2016    --------------------------------------------------------------------------------------------------  ASSESSMENT AND PLAN: Sinus tachycardia This does not represent a primary arrhythmia but instead reflects physiologic response to a physical or emotional stressor. Though initial vital signs and EKG today demonstrates mild sinus tachycardia, subsequent heart rate check is within the normal range. Workup in the ED earlier in the month was unremarkable, including normal potassium and TSH. I suspect that his tachycardia at the time of colonoscopy may have been due to anxiety related to the procedure as well as fluid shifts associated with bowel prep. The patient does not have signs or symptoms of decompensated heart failure. However, given his elevated blood pressure, he is at risk  for structural heart disease. We have therefore agreed to obtain a transthoracic echocardiogram. Unless he has significant abnormalities on echocardiogram or develops symptoms, such as chest pain or shortness of breath, I would defer further cardiac workup. I encouraged him to reduce/stop his caffeine consumption.  Hypertension Blood pressure is significantly elevated today. Review of prior vital signs in our system is notable for upper normal to elevated blood pressure as far back as 02/2015. We have discussed lifestyle modifications, including low-sodium diet. We have also agreed to initiate amlodipine 5 mg daily for blood pressure control.  Follow-up: Return to clinic in 1 month.  Nelva Bush, MD 07/18/2016 4:24 PM

## 2016-07-18 ENCOUNTER — Encounter: Payer: Self-pay | Admitting: Internal Medicine

## 2016-08-27 ENCOUNTER — Ambulatory Visit (INDEPENDENT_AMBULATORY_CARE_PROVIDER_SITE_OTHER): Payer: 59

## 2016-08-27 ENCOUNTER — Other Ambulatory Visit: Payer: Self-pay

## 2016-08-27 DIAGNOSIS — R000 Tachycardia, unspecified: Secondary | ICD-10-CM | POA: Diagnosis not present

## 2016-08-27 DIAGNOSIS — I10 Essential (primary) hypertension: Secondary | ICD-10-CM | POA: Diagnosis not present

## 2016-09-02 ENCOUNTER — Encounter: Payer: Self-pay | Admitting: Internal Medicine

## 2016-09-02 ENCOUNTER — Ambulatory Visit (INDEPENDENT_AMBULATORY_CARE_PROVIDER_SITE_OTHER): Payer: 59 | Admitting: Internal Medicine

## 2016-09-02 VITALS — BP 158/86 | HR 95 | Ht 77.0 in | Wt 171.2 lb

## 2016-09-02 DIAGNOSIS — R000 Tachycardia, unspecified: Secondary | ICD-10-CM

## 2016-09-02 DIAGNOSIS — I10 Essential (primary) hypertension: Secondary | ICD-10-CM | POA: Diagnosis not present

## 2016-09-02 MED ORDER — AMLODIPINE BESYLATE 10 MG PO TABS: 10.0000 mg | ORAL_TABLET | Freq: Every day | ORAL | 3 refills | Status: DC

## 2016-09-02 NOTE — Progress Notes (Signed)
Follow-up Outpatient Visit Date: 09/02/2016  Primary Care Provider: Steele Sizer, [address] [place] [address]  Chief Complaint: Follow-up tachycardia  HPI:  [name] is a 61 y.o. year-old male with history of hyperlipidemia, elevated blood pressure, and erectile dysfunction, who presents for follow-up of sinus tachycardia noted prior to screening colonoscopy. I met him on 07/16/16 after recent episode of sinus tachycardia when he presented for colonoscopy. The procedure was canceled and Mr. Hissong was referred to the ED for further evaluation. Workup was unremarkable. At the time of our first visit, he was feeling well without any symptoms. EKG showed sinus tachycardia with a rate of 106 bpm. We proceed with transthoracic echocardiogram, which did not show any significant abnormalities. Today, he feels well. He notes increased energy since we started amlodipine. He denies leg swelling as well as chest pain, shortness of breath, palpitations, lightheadedness, orthopnea, PND, and claudication. He is tolerating amlodipine.  --------------------------------------------------------------------------------------------------  Cardiovascular History & Procedures: Cardiovascular Problems:  Sinus tachycardia  Risk Factors:  Hyperlipidemia, elevated blood pressure, male gender, family history, and age greater than 1  Cath/PCI:  None  CV Surgery:  None  EP Procedures and Devices:  None  Non-Invasive Evaluation(s):  TTE (08/27/16): Normal LV size and function. LVEF 60-65% with normal wall motion and diastolic function. Normal RV size and function. Mild aortic valve thickening without stenosis..  Recent CV Pertinent Labs: Lab Results  Component Value Date   CHOL 213 (H) 06/08/2016   CHOL 203 (H) 03/13/2015   HDL 54 06/08/2016   HDL 51 03/13/2015   LDLCALC 146 (H) 06/08/2016   LDLCALC 136 (H) 03/13/2015   TRIG 65 06/08/2016   CHOLHDL 3.9  06/08/2016   K 3.8 07/03/2016   BUN 14 07/03/2016   BUN 12 03/13/2015   CREATININE 1.03 07/03/2016   CREATININE 0.98 06/08/2016    Past medical and surgical history were reviewed and updated in EPIC.  Outpatient Encounter Prescriptions as of 09/02/2016  Medication Sig  . amLODipine (NORVASC) 5 MG tablet Take 1 tablet (5 mg total) by mouth daily.  Marland Kitchen aspirin EC 81 MG tablet Take 1 tablet (81 mg total) by mouth daily.  Marland Kitchen CIALIS 20 MG tablet Take 1 tablet (20 mg total) by mouth daily as needed for erectile dysfunction.  . Multiple Vitamins-Minerals (MULTIVITAMIN) tablet Take 1 tablet by mouth daily.   No facility-administered encounter medications on file as of 09/02/2016.     Allergies: Patient has no known allergies.  Social History   Social History  . Marital status: Married    Spouse name: Charles Valentine  . Number of children: 2  . Years of education: N/A   Occupational History  . machine operator [employer]   Social History Main Topics  . Smoking status: Former Smoker    Packs/day: 0.50    Years: 10.00    Types: Cigarettes    Quit date: 1989  . Smokeless tobacco: Never Used  . Alcohol use 4.8 oz/week    6 Cans of beer, 2 Shots of liquor per week  . Drug use: No  . Sexual activity: Yes   Other Topics Concern  . Not on file   Social History Narrative   Married    He has two grown son's   Works as Radio producer     Family History  Problem Relation Age of Onset  . Hypertension Mother   . Diabetes Mother   . Heart disease Mother 67  CABG  . Heart attack Mother 82  . Obesity Sister   . Drug abuse Brother     Review of Systems: A 12-system review of systems was performed and was negative except as noted in the HPI.  --------------------------------------------------------------------------------------------------  Physical Exam: BP (!) 164/80 (BP Location: Left Arm, Patient Position: Sitting, Cuff Size: Normal)   Pulse 95   Ht _0  (1.956 m)    Wt 171 lb 4 oz (77.7 kg)   BMI 20.31 kg/m   Repeat BP 158/86  General:  Slender man, seated comfortably in the exam room. HEENT: No conjunctival pallor or scleral icterus.  Moist mucous membranes.  OP clear. Neck: Supple without lymphadenopathy, thyromegaly, JVD, or HJR. Lungs: Normal work of breathing.  Clear to auscultation bilaterally without wheezes or crackles. Heart: Regular rate and rhythm without murmurs, rubs, or gallops.  Non-displaced PMI. Abd: Bowel sounds present.  Soft, NT/ND without hepatosplenomegaly Ext: No lower extremity edema.  Radial, PT, and DP pulses are 2+ bilaterally. Skin: warm and dry without rash  EKG:  Normal sinus rhythm (heart rate 95 bpm) without significant abnormalities.  Lab Results  Component Value Date   WBC 14.9 (H) 07/03/2016   HGB 16.4 07/03/2016   HCT 47.8 07/03/2016   MCV 87.9 07/03/2016   PLT 300 07/03/2016    Lab Results  Component Value Date   NA 134 (L) 07/03/2016   K 3.8 07/03/2016   CL 98 (L) 07/03/2016   CO2 22 07/03/2016   BUN 14 07/03/2016   CREATININE 1.03 07/03/2016   GLUCOSE 88 07/03/2016   ALT 21 06/08/2016    Lab Results  Component Value Date   CHOL 213 (H) 06/08/2016   HDL 54 06/08/2016   LDLCALC 146 (H) 06/08/2016   TRIG 65 06/08/2016   CHOLHDL 3.9 06/08/2016   Lab Results  Component Value Date   TSH 1.593 07/03/2016   --------------------------------------------------------------------------------------------------  ASSESSMENT AND PLAN: Sinus tachycardia Heart rate is upper normal today but decreased from her prior visit. He is asymptomatic. Echocardiogram did not show any structural abnormalities. TSH in 06/2016 was also normal. We will not proceed with any further workup at this time.  Hypertension Blood pressure remains mildly elevated today, albeit improved from her prior visit. We have discussed further treatment options and have agreed to increase amlodipine to 10 mg daily. I have asked Mr.  Kantz to monitor his blood pressure at home and to let us know if it is consistently above 140/90. He should also let us know if he develops medication side effects, particularly leg swelling.  Follow-up: Return to clinic in 3 months.  Nelva Bush, MD 09/02/2016 11:50 AM

## 2016-09-02 NOTE — Patient Instructions (Signed)
Medication Instructions:  Your physician has recommended you make the following change in your medication:  1- INCREASE Amlodipine to 10 mg by mouth once a day.  Your physician has requested that you regularly monitor and record your blood pressure readings at home. Please use the same machine at the same time of day to check your readings and record them to bring to your follow-up visit. - If you BP is greater than 140/90, PLEASE CALL us FOR ADVICE.   Labwork: none  Testing/Procedures: none  Follow-Up: Your physician recommends that you schedule a follow-up appointment in: 3 MONTHS WITH DR END.   If you need a refill on your cardiac medications before your next appointment, please call your pharmacy.

## 2016-12-09 ENCOUNTER — Ambulatory Visit (INDEPENDENT_AMBULATORY_CARE_PROVIDER_SITE_OTHER): Payer: 59 | Admitting: Internal Medicine

## 2016-12-09 ENCOUNTER — Other Ambulatory Visit
Admission: RE | Admit: 2016-12-09 | Discharge: 2016-12-09 | Disposition: A | Discharge status: Home / Self Care | Payer: 59 | Source: Ambulatory Visit | Attending: Internal Medicine | Admitting: Internal Medicine

## 2016-12-09 ENCOUNTER — Encounter: Payer: Self-pay | Admitting: Internal Medicine

## 2016-12-09 VITALS — BP 156/80 | HR 86 | Ht 72.5 in | Wt 170.5 lb

## 2016-12-09 DIAGNOSIS — Z79899 Other long term (current) drug therapy: Secondary | ICD-10-CM

## 2016-12-09 DIAGNOSIS — I10 Essential (primary) hypertension: Secondary | ICD-10-CM

## 2016-12-09 LAB — BASIC METABOLIC PANEL
Anion gap: 9 (ref 5–15)
BUN: 13 mg/dL (ref 6–20)
CO2: 26 mmol/L (ref 22–32)
Calcium: 9.5 mg/dL (ref 8.9–10.3)
Chloride: 103 mmol/L (ref 101–111)
Creatinine, Ser: 0.87 mg/dL (ref 0.61–1.24)
GFR calc Af Amer: >60 mL/min (ref >60)
GLUCOSE: 104 mg/dL — AB (ref 65–99)
Potassium: 4.1 mmol/L (ref 3.5–5.1)
Sodium: 138 mmol/L (ref 135–145)

## 2016-12-09 MED ORDER — AMLODIPINE BESYLATE 10 MG PO TABS: 10.0000 mg | ORAL_TABLET | Freq: Every day | ORAL | 3 refills | Status: DC

## 2016-12-09 MED ORDER — HYDROCHLOROTHIAZIDE 25 MG PO TABS: 25.0000 mg | ORAL_TABLET | Freq: Every day | ORAL | 3 refills | Status: DC

## 2016-12-09 NOTE — Progress Notes (Signed)
Follow-up Outpatient Visit Date: 12/09/2016  Primary Care Provider: Steele Sizer, [address] [place] [address]  Chief Complaint: Elevated blood pressure  HPI:  Charles Valentine is a 61 y.o. year-old male with history of hyperlipidemia, hypertension, and erectile dysfunction, who presents for follow-up of elevated blood pressure. I last saw him in mid May, at which time his previously noted sinus tachycardia had resolved. Blood pressure was still elevated, prompting [place] to increase amlodipine to 10 mg daily. Charles Valentine initially noted some leg edema with this, though that has subsequently resolved. He is otherwise tolerating the medication well, though he notes that his blood pressure remains mildly elevated at home. He has not had any chest pain, shortness of breath, palpitations, lightheadedness, orthopnea, or PND. He is trying to watch his sodium intake, though he eats out on a regular basis. He has not had any alcohol in the last month.  --------------------------------------------------------------------------------------------------  Cardiovascular History & Procedures: Cardiovascular Problems:  Sinus tachycardia  Risk Factors:  Hyperlipidemia, elevated blood pressure, male gender, family history, and age greater than 54  Cath/PCI:  None  CV Surgery:  None  EP Procedures and Devices:  None  Non-Invasive Evaluation(s):  TTE (08/27/16): Normal LV size and function. LVEF 60-65% with normal wall motion and diastolic function. Normal RV size and function. Mild aortic valve thickening without stenosis..  Recent CV Pertinent Labs: Lab Results  Component Value Date   CHOL 213 (H) 06/08/2016   CHOL 203 (H) 03/13/2015   HDL 54 06/08/2016   HDL 51 03/13/2015   LDLCALC 146 (H) 06/08/2016   LDLCALC 136 (H) 03/13/2015   TRIG 65 06/08/2016   CHOLHDL 3.9 06/08/2016   K 3.8 07/03/2016   BUN 14 07/03/2016   BUN 12 03/13/2015   CREATININE 1.03  07/03/2016   CREATININE 0.98 06/08/2016    Past medical and surgical history were reviewed and updated in EPIC.  Current Meds  Medication Sig  . amLODipine (NORVASC) 10 MG tablet Take 1 tablet (10 mg total) by mouth daily.  Marland Kitchen aspirin EC 81 MG tablet Take 1 tablet (81 mg total) by mouth daily.  Marland Kitchen CIALIS 20 MG tablet Take 1 tablet (20 mg total) by mouth daily as needed for erectile dysfunction.  . Multiple Vitamins-Minerals (MULTIVITAMIN) tablet Take 1 tablet by mouth daily.    Allergies: Patient has no known allergies.  Social History   Social History  . Marital status: Married    Spouse name: Vanita Ingles  . Number of children: 2  . Years of education: N/A   Occupational History  . machine operator [employer]   Social History Main Topics  . Smoking status: Former Smoker    Packs/day: 0.50    Years: 10.00    Types: Cigarettes    Quit date: 1989  . Smokeless tobacco: Never Used  . Alcohol use 4.8 oz/week    6 Cans of beer, 2 Shots of liquor per week  . Drug use: No  . Sexual activity: Yes   Other Topics Concern  . Not on file   Social History Narrative   Married    He has two grown son's   Works as Radio producer     Family History  Problem Relation Age of Onset  . Hypertension Mother   . Diabetes Mother   . Heart disease Mother 64       CABG  . Heart attack Mother 1  . Obesity Sister   . Drug abuse Brother  Review of Systems: A 12-system review of systems was performed and was negative except as noted in the HPI.  --------------------------------------------------------------------------------------------------  Physical Exam: BP (!) 156/80 (BP Location: Left Arm, Patient Position: Sitting)   Pulse 86   Ht 6' 0.5" (1.842 m)   Wt 170 lb 8 oz (77.3 kg)   BMI 22.81 kg/m    General:  Well-developed, well-nourished man, seated comfortably in the exam room. HEENT: No conjunctival pallor or scleral icterus.  Moist mucous membranes.  OP  clear. Neck: Supple without lymphadenopathy, thyromegaly, JVD, or HJR. Lungs: Normal work of breathing.  Clear to auscultation bilaterally without wheezes or crackles. Heart: Regular rate and rhythm without murmurs, rubs, or gallops.  Non-displaced PMI. Abd: Bowel sounds present.  Soft, NT/ND without hepatosplenomegaly Ext: No lower extremity edema.  Radial, PT, and DP pulses are 2+ bilaterally. Skin: Warm and dry without rash.  EKG: Normal sinus rhythm without significant abnormalities. Heart rate 86 bpm.  Lab Results  Component Value Date   WBC 14.9 (H) 07/03/2016   HGB 16.4 07/03/2016   HCT 47.8 07/03/2016   MCV 87.9 07/03/2016   PLT 300 07/03/2016    Lab Results  Component Value Date   NA 134 (L) 07/03/2016   K 3.8 07/03/2016   CL 98 (L) 07/03/2016   CO2 22 07/03/2016   BUN 14 07/03/2016   CREATININE 1.03 07/03/2016   GLUCOSE 88 07/03/2016   ALT 21 06/08/2016    Lab Results  Component Value Date   CHOL 213 (H) 06/08/2016   HDL 54 06/08/2016   LDLCALC 146 (H) 06/08/2016   TRIG 65 06/08/2016   CHOLHDL 3.9 06/08/2016   Lab Results  Component Value Date   TSH 1.593 07/03/2016   --------------------------------------------------------------------------------------------------  ASSESSMENT AND PLAN: Hypertension Blood pressure remains elevated today, though improved from our initial visit in March. Charles Valentine is tolerating amlodipine 10 mg daily well. We have agreed to initiate hydrochlorothiazide 25 mg daily. I encouraged him to limit his sodium as much as possible. We will check a basic metabolic panel today and again in about a month. We will also recheck his blood pressure at that time. If his blood pressure remains difficult to control despite multiple agents, we will consider workup for secondary hypertension.  Follow-up: Labs and blood pressure check in one month; return to see me in 3 months.  Nelva Bush, MD 12/09/2016 10:00 AM

## 2016-12-09 NOTE — Patient Instructions (Addendum)
Medication Instructions:  Your physician has recommended you make the following change in your medication:  1- START Hydrochlorothiazide 25 mg (1 tablet) by mouth once a day.   Labwork: 1- Your physician recommends that you return for lab work in: TODAY (BMP). - Please go to the [facility]. You will check in at the front desk to the right as you walk into the atrium. Valet Parking is offered if needed.   2- Your physician recommends that you return for lab work in: 1 MONTH (BMP) [place] AS BP CHECK.   Testing/Procedures: NONE  Follow-Up: Your physician recommends that you schedule a follow-up appointment in: 1 MONTH FOR BP CHECK AND LAB WORK.  Your physician recommends that you schedule a follow-up appointment in: 3 MONTHS WITH DR END.   If you need a refill on your cardiac medications before your next appointment, please call your pharmacy.

## 2017-01-11 ENCOUNTER — Other Ambulatory Visit: Payer: 59

## 2017-01-11 ENCOUNTER — Ambulatory Visit (INDEPENDENT_AMBULATORY_CARE_PROVIDER_SITE_OTHER): Payer: 59

## 2017-01-11 VITALS — BP 144/80 | HR 80 | Resp 16

## 2017-01-11 DIAGNOSIS — I10 Essential (primary) hypertension: Secondary | ICD-10-CM

## 2017-01-11 NOTE — Patient Instructions (Addendum)
1.) Reason for visit: BP check  2.) Name of MD requesting visit: Dr. Saunders Revel  3.) H&P: Pt has hx of HTN and sinus tachycardia  4.) ROS related to problem: Pt in office for a scheduled BP check and BMET since prescribed HCTZ 25mg  on August 22. Pt reports he never started taking this medication as his LE swelling resolved on its own and BP is better controlled.  He did not have BMET today. BP 140/80 in the office. Legs do not show any edema today.  States he ate "bad" this weekend while watching football games, consuming chicken wings and other salty foods.   5.) Assessment and plan per MD: He will limit sodium intake and continue to monitor BP and HR and await further instructions regarding pressure from Dr. Saunders Revel. He has an 11/27 f/u visit scheduled.

## 2017-01-13 NOTE — Progress Notes (Signed)
Continue current medications, lifestyle modifications, and home BP monitoring. If BP is consistent above 140/90, Mr. Doolan should contact us for further instructions. Thanks.  Nelva Bush, MD [facility] Pager: [phone]

## 2017-01-14 ENCOUNTER — Telehealth: Payer: Self-pay

## 2017-01-14 NOTE — Telephone Encounter (Addendum)
-----   Message from Nelva Bush, MD sent at 01/13/2017  7:58 AM EDT ----- Continue current medications, lifestyle modifications, and home BP monitoring. If BP is consistent above 140/90, [name] should contact us for further instructions. Thanks.  Nelva Bush, MD [facility] Pager: [phone]  Reviewed recommendations w/pt who verbalized understanding and is agreeable.

## 2017-03-16 ENCOUNTER — Ambulatory Visit: Payer: 59 | Admitting: Internal Medicine

## 2017-03-16 ENCOUNTER — Encounter: Payer: Self-pay | Admitting: Internal Medicine

## 2017-03-16 VITALS — BP 162/80 | HR 81 | Ht 72.0 in | Wt 178.8 lb

## 2017-03-16 DIAGNOSIS — I10 Essential (primary) hypertension: Secondary | ICD-10-CM

## 2017-03-16 DIAGNOSIS — R000 Tachycardia, unspecified: Secondary | ICD-10-CM

## 2017-03-16 MED ORDER — AMLODIPINE BESYLATE 10 MG PO TABS: 10.0000 mg | ORAL_TABLET | Freq: Every day | ORAL | 3 refills | Status: DC

## 2017-03-16 NOTE — Patient Instructions (Addendum)
Medication Instructions:  Your physician recommends that you continue on your current medications as directed. Please refer to the Current Medication list given to you today.   Labwork: none  Testing/Procedures: none  Follow-Up: Your physician wants you to follow-up in: 6 MONTHS WITH DR END. You will receive a reminder letter in the mail two months in advance. If you don't receive a letter, please call our office to schedule the follow-up appointment.    Any Other Special Instructions Will Be Listed Below (If Applicable).   DASH Eating Plan DASH stands for "Dietary Approaches to Stop Hypertension." The DASH eating plan is a healthy eating plan that has been shown to reduce high blood pressure (hypertension). It may also reduce your risk for type 2 diabetes, heart disease, and stroke. The DASH eating plan may also help with weight loss. What are tips for following this plan? General guidelines  Avoid eating more than 2,300 mg (milligrams) of salt (sodium) a day. If you have hypertension, you may need to reduce your sodium intake to 1,500 mg a day.  Limit alcohol intake to no more than 1 drink a day for nonpregnant women and 2 drinks a day for men. One drink equals 12 oz of beer, 5 oz of wine, or 1 oz of hard liquor.  Work with your health care provider to maintain a healthy body weight or to lose weight. Ask what an ideal weight is for you.  Get at least 30 minutes of exercise that causes your heart to beat faster (aerobic exercise) most days of the week. Activities may include walking, swimming, or biking.  Work with your health care provider or diet and nutrition specialist (dietitian) to adjust your eating plan to your individual calorie needs. Reading food labels  Check food labels for the amount of sodium per serving. Choose foods with less than 5 percent of the Daily Value of sodium. Generally, foods with less than 300 mg of sodium per serving fit into this eating plan.  To  find whole grains, look for the word "whole" as the first word in the ingredient list. Shopping  Buy products labeled as "low-sodium" or "no salt added."  Buy fresh foods. Avoid canned foods and premade or frozen meals. Cooking  Avoid adding salt when cooking. Use salt-free seasonings or herbs instead of table salt or sea salt. Check with your health care provider or pharmacist before using salt substitutes.  Do not fry foods. Cook foods using healthy methods such as baking, boiling, grilling, and broiling instead.  Cook with heart-healthy oils, such as olive, canola, soybean, or sunflower oil. Meal planning   Eat a balanced diet that includes: ? 5 or more servings of fruits and vegetables each day. At each meal, try to fill half of your plate with fruits and vegetables. ? Up to 6-8 servings of whole grains each day. ? Less than 6 oz of lean meat, poultry, or fish each day. A 3-oz serving of meat is about the same size as a deck of cards. One egg equals 1 oz. ? 2 servings of low-fat dairy each day. ? A serving of nuts, seeds, or beans 5 times each week. ? Heart-healthy fats. Healthy fats called Omega-3 fatty acids are found in foods such as flaxseeds and coldwater fish, like sardines, salmon, and mackerel.  Limit how much you eat of the following: ? Canned or prepackaged foods. ? Food that is high in trans fat, such as fried foods. ? Food that is high in  saturated fat, such as fatty meat. ? Sweets, desserts, sugary drinks, and other foods with added sugar. ? Full-fat dairy products.  Do not salt foods before eating.  Try to eat at least 2 vegetarian meals each week.  Eat more home-cooked food and less restaurant, buffet, and fast food.  When eating at a restaurant, ask that your food be prepared with less salt or no salt, if possible. What foods are recommended? The items listed may not be a complete list. Talk with your dietitian about what dietary choices are best for  you. Grains Whole-grain or whole-wheat bread. Whole-grain or whole-wheat pasta. Brown rice. [place]. Bulgur. Whole-grain and low-sodium cereals. Pita bread. Low-fat, low-sodium crackers. Whole-wheat flour tortillas. Vegetables Fresh or frozen vegetables (raw, steamed, roasted, or grilled). Low-sodium or reduced-sodium tomato and vegetable juice. Low-sodium or reduced-sodium tomato sauce and tomato paste. Low-sodium or reduced-sodium canned vegetables. Fruits All fresh, dried, or frozen fruit. Canned fruit in natural juice (without added sugar). Meat and other protein foods Skinless chicken or [place]. Ground chicken or [place]. Pork with fat trimmed off. Fish and seafood. Egg whites. Dried beans, peas, or lentils. Unsalted nuts, nut butters, and seeds. Unsalted canned beans. Lean cuts of beef with fat trimmed off. Low-sodium, lean deli meat. Dairy Low-fat (1%) or fat-free (skim) milk. Fat-free, low-fat, or reduced-fat cheeses. Nonfat, low-sodium ricotta or cottage cheese. Low-fat or nonfat yogurt. Low-fat, low-sodium cheese. Fats and oils Soft margarine without trans fats. Vegetable oil. Low-fat, reduced-fat, or light mayonnaise and salad dressings (reduced-sodium). Canola, safflower, olive, soybean, and sunflower oils. Avocado. Seasoning and other foods Herbs. Spices. Seasoning mixes without salt. Unsalted popcorn and pretzels. Fat-free sweets. What foods are not recommended? The items listed may not be a complete list. Talk with your dietitian about what dietary choices are best for you. Grains Baked goods made with fat, such as croissants, muffins, or some breads. Dry pasta or rice meal packs. Vegetables Creamed or fried vegetables. Vegetables in a cheese sauce. Regular canned vegetables (not low-sodium or reduced-sodium). Regular canned tomato sauce and paste (not low-sodium or reduced-sodium). Regular tomato and vegetable juice (not low-sodium or reduced-sodium). Angie Fava.  Olives. Fruits Canned fruit in a light or heavy syrup. Fried fruit. Fruit in cream or butter sauce. Meat and other protein foods Fatty cuts of meat. Ribs. Fried meat. Berniece Salines. Sausage. Bologna and other processed lunch meats. Salami. Fatback. Hotdogs. Bratwurst. Salted nuts and seeds. Canned beans with added salt. Canned or smoked fish. Whole eggs or egg yolks. Chicken or [place] with skin. Dairy Whole or 2% milk, cream, and half-and-half. Whole or full-fat cream cheese. Whole-fat or sweetened yogurt. Full-fat cheese. Nondairy creamers. Whipped toppings. Processed cheese and cheese spreads. Fats and oils Butter. Stick margarine. Lard. Shortening. Ghee. Bacon fat. Tropical oils, such as coconut, palm kernel, or palm oil. Seasoning and other foods Salted popcorn and pretzels. Onion salt, garlic salt, seasoned salt, table salt, and sea salt. Worcestershire sauce. Tartar sauce. Barbecue sauce. Teriyaki sauce. Soy sauce, including reduced-sodium. Steak sauce. Canned and packaged gravies. Fish sauce. Oyster sauce. Cocktail sauce. Horseradish that you find on the shelf. Ketchup. Mustard. Meat flavorings and tenderizers. Bouillon cubes. Hot sauce and Tabasco sauce. Premade or packaged marinades. Premade or packaged taco seasonings. Relishes. Regular salad dressings. Where to find more information:  National Heart, Lung, and [place]: https://wilson-eaton.com/  American Heart Association: www.heart.org Summary  The DASH eating plan is a healthy eating plan that has been shown to reduce high blood pressure (hypertension). It may also  reduce your risk for type 2 diabetes, heart disease, and stroke.  With the DASH eating plan, you should limit salt (sodium) intake to 2,300 mg a day. If you have hypertension, you may need to reduce your sodium intake to 1,500 mg a day.  When on the DASH eating plan, aim to eat more fresh fruits and vegetables, whole grains, lean proteins, low-fat dairy, and heart-healthy  fats.  Work with your health care provider or diet and nutrition specialist (dietitian) to adjust your eating plan to your individual calorie needs. This information is not intended to replace advice given to you by your health care provider. Make sure you discuss any questions you have with your health care provider. Document Released: 03/26/2011 Document Revised: 03/30/2016 Document Reviewed: 03/30/2016 Elsevier Interactive Patient Education  2017 [employer].    If you need a refill on your cardiac medications before your next appointment, please call your pharmacy.

## 2017-03-16 NOTE — Progress Notes (Signed)
Follow-up Outpatient Visit Date: 03/16/2017  Primary Care Provider: Steele Sizer, [address] [place] [address]  Chief Complaint: Follow-up hypertension  HPI:  [name] is a 61 y.o. year-old male with history of hypertension, hyperlipidemia, and erectile dysfunction, who presents for follow-up of hypertension. I last saw [name] in late August, which time he still had elevated blood pressure. We discussed adding HCTZ to his amlodipine. He will return for blood pressure follow-up in September, which time it was improved but still not quite at goal. Today, [name] reports that he feels well. Home blood pressure has still been elevated. He attributes some of this to eating more salt over Thanksgiving as well as stress related to losing his job next month. On further questioning, turns out that he never started HCTZ, as he just did not feel like taking the medicine. He has been asymptomatic without chest pain, shortness of breath, palpitations, lightheadedness, and edema. He does not exercise regularly but is fairly active at work..  --------------------------------------------------------------------------------------------------  Cardiovascular History & Procedures: Cardiovascular Problems:  Sinus tachycardia  Risk Factors:  Hyperlipidemia, elevated blood pressure, male gender, family history, and age greater than 50  Cath/PCI:  None  CV Surgery:  None  EP Procedures and Devices:  None  Non-Invasive Evaluation(s):  TTE (08/27/16): Normal LV size and function. LVEF 60-65% with normal wall motion and diastolic function. Normal RV size and function. Mild aortic valve thickening without stenosis.  Recent CV Pertinent Labs: Lab Results  Component Value Date   CHOL 213 (H) 06/08/2016   CHOL 203 (H) 03/13/2015   HDL 54 06/08/2016   HDL 51 03/13/2015   LDLCALC 146 (H) 06/08/2016   LDLCALC 136 (H) 03/13/2015   TRIG 65 06/08/2016    CHOLHDL 3.9 06/08/2016   K 4.1 12/09/2016   BUN 13 12/09/2016   BUN 12 03/13/2015   CREATININE 0.87 12/09/2016   CREATININE 0.98 06/08/2016    Past medical and surgical history were reviewed and updated in EPIC.  Current Meds  Medication Sig  . aspirin EC 81 MG tablet Take 1 tablet (81 mg total) by mouth daily.  Marland Kitchen CIALIS 20 MG tablet Take 1 tablet (20 mg total) by mouth daily as needed for erectile dysfunction.  . Multiple Vitamins-Minerals (MULTIVITAMIN) tablet Take 1 tablet by mouth daily.    Allergies: Patient has no known allergies.  Social History   Socioeconomic History  . Marital status: Married    Spouse name: Charles Valentine  . Number of children: 2  . Years of education: Not on file  . Highest education level: Not on file  Social Needs  . Financial resource strain: Not on file  . Food insecurity - worry: Not on file  . Food insecurity - inability: Not on file  . Transportation needs - medical: Not on file  . Transportation needs - non-medical: Not on file  Occupational History  . Occupation: Glass blower/designer    Employer: [employer]  Tobacco Use  . Smoking status: Former Smoker    Packs/day: 0.50    Years: 10.00    Pack years: 5.00    Types: Cigarettes    Last attempt to quit: 1989    Years since quitting: 29.9  . Smokeless tobacco: Never Used  Substance and Sexual Activity  . Alcohol use: Yes    Alcohol/week: 4.8 oz    Types: 6 Cans of beer, 2 Shots of liquor per week    Comment: No beer in last  month  . Drug use: No  . Sexual activity: Yes  Other Topics Concern  . Not on file  Social History Narrative   Married    He has two grown son's   Works as Radio producer     Family History  Problem Relation Age of Onset  . Hypertension Mother   . Diabetes Mother   . Heart disease Mother 68       CABG  . Heart attack Mother 41  . Obesity Sister   . Drug abuse Brother     Review of Systems: A 12-system review of systems was performed and was  negative except as noted in the HPI.  --------------------------------------------------------------------------------------------------  Physical Exam: BP (!) 162/80 (BP Location: Left Arm, Patient Position: Sitting, Cuff Size: Normal)   Pulse 81   Ht 6' (1.829 m)   Wt 178 lb 12 oz (81.1 kg)   BMI 24.24 kg/m   General:  Well-developed, well-nourished man, seated comfortably in the exam room. HEENT: No conjunctival pallor or scleral icterus. Moist mucous membranes.  OP clear. Neck: Supple without lymphadenopathy, thyromegaly, JVD, or HJR. Lungs: Normal work of breathing. Clear to auscultation bilaterally without wheezes or crackles. Heart: Regular rate and rhythm without murmurs, rubs, or gallops. Non-displaced PMI. Abd: Bowel sounds present. Soft, NT/ND without hepatosplenomegaly Ext: No lower extremity edema. Radial, PT, and DP pulses are 2+ bilaterally. Skin: Warm and dry without rash.  EKG:  Normal sinus rhythm with left atrial enlargement. Otherwise, no significant abnormalities.  Lab Results  Component Value Date   WBC 14.9 (H) 07/03/2016   HGB 16.4 07/03/2016   HCT 47.8 07/03/2016   MCV 87.9 07/03/2016   PLT 300 07/03/2016    Lab Results  Component Value Date   NA 138 12/09/2016   K 4.1 12/09/2016   CL 103 12/09/2016   CO2 26 12/09/2016   BUN 13 12/09/2016   CREATININE 0.87 12/09/2016   GLUCOSE 104 (H) 12/09/2016   ALT 21 06/08/2016    Lab Results  Component Value Date   CHOL 213 (H) 06/08/2016   HDL 54 06/08/2016   LDLCALC 146 (H) 06/08/2016   TRIG 65 06/08/2016   CHOLHDL 3.9 06/08/2016    --------------------------------------------------------------------------------------------------  ASSESSMENT AND PLAN: Essential hypertension Blood pressure suboptimally controlled on amlodipine 10 mg daily. We again discussed adding HCTZ or an alternative agent. Mr. Hardage is convinced that he will be able to get his blood pressure down with lifestyle  modifications. I have advised him that he should avoid eating salt as much as possible. He has been provided with information about the DASH diet. I still suspect that he will need additional medications to achieve a target blood pressure of less than 140/90 (ideally closer to 130/80). He will continue to monitor his blood pressure at home.  Sinus tachycardia No palpitations noted. EKG today demonstrates sinus rhythm with a heart rate in the 80s. No further workup.  Follow-up: Return to clinic in 6 months.   Nelva Bush, MD 03/16/2017 1:27 PM

## 2017-06-11 ENCOUNTER — Ambulatory Visit (INDEPENDENT_AMBULATORY_CARE_PROVIDER_SITE_OTHER): Payer: Managed Care, Other (non HMO) | Admitting: Family Medicine

## 2017-06-11 ENCOUNTER — Encounter: Payer: Self-pay | Admitting: Family Medicine

## 2017-06-11 VITALS — BP 140/70 | HR 81 | Temp 98.3°F | Resp 14 | Ht 72.5 in | Wt 171.4 lb

## 2017-06-11 DIAGNOSIS — Z23 Encounter for immunization: Secondary | ICD-10-CM

## 2017-06-11 DIAGNOSIS — Z0001 Encounter for general adult medical examination with abnormal findings: Secondary | ICD-10-CM | POA: Diagnosis not present

## 2017-06-11 DIAGNOSIS — Z1211 Encounter for screening for malignant neoplasm of colon: Secondary | ICD-10-CM

## 2017-06-11 DIAGNOSIS — R000 Tachycardia, unspecified: Secondary | ICD-10-CM | POA: Diagnosis not present

## 2017-06-11 DIAGNOSIS — Z131 Encounter for screening for diabetes mellitus: Secondary | ICD-10-CM | POA: Diagnosis not present

## 2017-06-11 DIAGNOSIS — F5101 Primary insomnia: Secondary | ICD-10-CM | POA: Diagnosis not present

## 2017-06-11 DIAGNOSIS — E785 Hyperlipidemia, unspecified: Secondary | ICD-10-CM

## 2017-06-11 DIAGNOSIS — N528 Other male erectile dysfunction: Secondary | ICD-10-CM

## 2017-06-11 DIAGNOSIS — I10 Essential (primary) hypertension: Secondary | ICD-10-CM

## 2017-06-11 DIAGNOSIS — Z125 Encounter for screening for malignant neoplasm of prostate: Secondary | ICD-10-CM

## 2017-06-11 DIAGNOSIS — Z0000 Encounter for general adult medical examination without abnormal findings: Secondary | ICD-10-CM

## 2017-06-11 LAB — POCT UA - MICROALBUMIN: MICROALBUMIN (UR) POC: NEGATIVE mg/L

## 2017-06-11 MED ORDER — NEBIVOLOL HCL 10 MG PO TABS: 10.0000 mg | ORAL_TABLET | Freq: Every day | ORAL | 1 refills | Status: DC

## 2017-06-11 MED ORDER — CIALIS 20 MG PO TABS: 20.0000 mg | ORAL_TABLET | Freq: Every day | ORAL | 11 refills | Status: DC | PRN

## 2017-06-11 MED ORDER — AMLODIPINE BESYLATE-VALSARTAN 10-160 MG PO TABS: 1.0000 | ORAL_TABLET | Freq: Every day | ORAL | 1 refills | Status: DC

## 2017-06-11 NOTE — Progress Notes (Addendum)
Name: Zaedyn Covin   MRN: [id number]    DOB: [birth year]   Date:06/11/2017       Progress Note  Subjective  Chief Complaint  Chief Complaint  Patient presents with  . Annual Exam    HPI  Patient presents for annual CPE and follow up  USPSTF grade A and B recommendations:  Diet: eats fast food three times a week, packs lunch for work  HTN: diagnosed last year, on Norvasc 10 mg and bp today is okay, but has been elevated at cardiologist office, he said white coat hypertension, he never filled HCTZ, but willing to try ARB - discussed possible side effects, including cough and angioedema. No chest pain or palpitatoin  Hyperlipidemia: we will recheck labs, we may need to start statin therapy  ED: he is taking Cialis occasionally, able to have an erection without medication but not as hard as it used to. No side effects of medication   Depression:  Depression screen [facility] 2/9 06/11/2017 06/08/2016 04/08/2016 03/13/2015 03/06/2015  Decreased Interest 0 0 0 0 0  Down, Depressed, Hopeless 0 0 0 0 0  PHQ - 2 Score 0 0 0 0 0    Hypertension:  BP Readings from Last 3 Encounters:  06/11/17 140/70  03/16/17 (!) 162/80  01/11/17 (!) 144/80    Obesity: Wt Readings from Last 3 Encounters:  06/11/17 171 lb 6.4 oz (77.7 kg)  03/16/17 178 lb 12 oz (81.1 kg)  12/09/16 170 lb 8 oz (77.3 kg)   BMI Readings from Last 3 Encounters:  06/11/17 22.93 kg/m  03/16/17 24.24 kg/m  12/09/16 22.81 kg/m     Lipids:  Lab Results  Component Value Date   CHOL 213 (H) 06/08/2016   CHOL 203 (H) 03/13/2015   Lab Results  Component Value Date   HDL 54 06/08/2016   HDL 51 03/13/2015   Lab Results  Component Value Date   LDLCALC 146 (H) 06/08/2016   LDLCALC 136 (H) 03/13/2015   Lab Results  Component Value Date   TRIG 65 06/08/2016   TRIG 81 03/13/2015   Lab Results  Component Value Date   CHOLHDL 3.9 06/08/2016   CHOLHDL 4.0 03/13/2015   No results found for: LDLDIRECT Glucose:   Glucose, Bld  Date Value Ref Range Status  12/09/2016 104 (H) 65 - 99 mg/dL Final  07/03/2016 88 65 - 99 mg/dL Final  06/08/2016 86 65 - 99 mg/dL Final    Alcohol: max of 6 pack on weekends Tobacco use: quit many years ago   Married STD testing and prevention (chl/gon/syphilis):  Not interested   Skin cancer: no changes  Colorectal cancer: cologuard, unable to get colonoscopy last year because of elevated bp and tachycardia Prostate cancer: not a candidate  Lab Results  Component Value Date   PSA 0.7 06/08/2016    IPSS Questionnaire (AUA-7): Over the past month.   1)  How often have you had a sensation of not emptying your bladder completely after you finish urinating?  1 - Less than 1 time in 5  2)  How often have you had to urinate again less than two hours after you finished urinating? 1 - Less than 1 time in 5  3)  How often have you found you stopped and started again several times when you urinated?  0 - Not at all  4) How difficult have you found it to postpone urination?  0 - Not at all  5) How often have you had a  weak urinary stream?  1 - Less than 1 time in 5  6) How often have you had to push or strain to begin urination?  0 - Not at all  7) How many times did you most typically get up to urinate from the time you went to bed until the time you got up in the morning?  2 - 2 times  Total score:  0-7 mildly symptomatic   8-19 moderately symptomatic   20-35 severely symptomatic   Lung cancer:  Low Dose CT Chest recommended if Age 60-80 years, 30 pack-year currently smoking OR have quit w/in 15years. Patient does not qualify.    Aspirin: yes ECG:  2018  Advanced Care Planning: A voluntary discussion about advance care planning including the explanation and discussion of advance directives.  Discussed health care proxy and Living will, and the patient was able to identify a health care proxy as wife  Patient does have a living will at present time. If patient does  have living will, I have requested they bring this to the clinic to be scanned in to their chart.  Patient Active Problem List   Diagnosis Date Noted  . Sinus tachycardia 09/02/2016  . Essential hypertension 09/02/2016  . Eczema 06/08/2016  . Hyperlipidemia LDL goal <130 04/08/2016  . ED (erectile dysfunction)     History reviewed. No pertinent surgical history.  Family History  Problem Relation Age of Onset  . Hypertension Mother   . Diabetes Mother   . Heart disease Mother 95       CABG  . Heart attack Mother 36  . Obesity Sister   . Drug abuse Brother     Social History   Socioeconomic History  . Marital status: Married    Spouse name: Vanita Ingles  . Number of children: 2  . Years of education: Not on file  . Highest education level: 12th grade  Social Needs  . Financial resource strain: Not hard at all  . Food insecurity - worry: Never true  . Food insecurity - inability: Never true  . Transportation needs - medical: No  . Transportation needs - non-medical: No  Occupational History  . Occupation: Glass blower/designer    Comment: [employer]  Tobacco Use  . Smoking status: Former Smoker    Packs/day: 0.50    Years: 10.00    Pack years: 5.00    Types: Cigarettes    Last attempt to quit: 1989    Years since quitting: 30.1  . Smokeless tobacco: Never Used  Substance and Sexual Activity  . Alcohol use: Yes    Alcohol/week: 4.8 oz    Types: 6 Cans of beer, 2 Shots of liquor per week  . Drug use: No  . Sexual activity: Yes    Partners: Female  Other Topics Concern  . 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  aspirin EC 81 MG tablet, Take 1 tablet (81 mg total) by mouth daily., Disp: 30 tablet, Rfl: 0 .  CIALIS 20 MG tablet, Take 1  tablet (20 mg total) by mouth daily as needed for erectile dysfunction., Disp: 6 tablet, Rfl: 11 .  amLODipine-valsartan (EXFORGE) 10-160 MG tablet, Take 1 tablet by  mouth daily., Disp: 90 tablet, Rfl: 1 .  Multiple Vitamins-Minerals (MULTIVITAMIN) tablet, Take 1 tablet by mouth daily. (Patient not taking: Reported on 06/11/2017), Disp: 30 tablet, Rfl: 0  No Known Allergies   ROS   Constitutional: Negative for fever or weight change.  Respiratory: Negative for cough and shortness of breath.   Cardiovascular: Negative for chest pain or palpitations.  Gastrointestinal: Negative for abdominal pain, no bowel changes.  Musculoskeletal: Negative for gait problem or joint swelling.  Skin: Negative for rash.  Neurological: Negative for dizziness or headache.  No other specific complaints in a complete review of systems (except as listed in HPI above).   Objective  Vitals:   06/11/17 0846  BP: 140/70  Pulse: 81  Resp: 14  Temp: 98.3 F (36.8 C)  TempSrc: Oral  SpO2: 98%  Weight: 171 lb 6.4 oz (77.7 kg)  Height: 6' 0.5" (1.842 m)    Body mass index is 22.93 kg/m.  Physical Exam  Constitutional: Patient appears well-developed and well-nourished. No distress.  HENT: Head: Normocephalic and atraumatic. Ears: B TMs ok, no erythema or effusion; Nose: Nose normal. Mouth/Throat: Oropharynx is clear and moist. No oropharyngeal exudate.  Eyes: Conjunctivae and EOM are normal. Pupils are equal, round, and reactive to light. No scleral icterus.  Neck: Normal range of motion. Neck supple. No JVD present. No thyromegaly present.  Cardiovascular: Normal rate, regular rhythm and normal heart sounds.  No murmur heard. No BLE edema. Pulmonary/Chest: Effort normal and breath sounds normal. No respiratory distress. Abdominal: Soft. Bowel sounds are normal, no distension. There is no tenderness. no masses MALE GENITALIA: Normal descended testes bilaterally, no masses palpated, no hernias, no lesions,  no discharge RECTAL: Prostate normal size and consistency, no rectal masses or hemorrhoids Musculoskeletal: Normal range of motion, no joint effusions. No gross deformities Neurological: he is alert and oriented to person, place, and time. No cranial nerve deficit. Coordination, balance, strength, speech and gait are normal.  Skin: Skin is warm and dry. No rash noted. No erythema.  Psychiatric: Patient has a normal mood and affect. behavior is normal. Judgment and thought content normal.   PHQ2/9: Depression screen [facility] 2/9 06/11/2017 06/08/2016 04/08/2016 03/13/2015 03/06/2015  Decreased Interest 0 0 0 0 0  Down, Depressed, Hopeless 0 0 0 0 0  PHQ - 2 Score 0 0 0 0 0     Fall Risk: Fall Risk  06/11/2017 06/08/2016 04/08/2016 03/13/2015 03/06/2015  Falls in the past year? No No No No No    Functional Status Survey: Is the patient deaf or have difficulty hearing?: No Does the patient have difficulty seeing, even when wearing glasses/contacts?: No Does the patient have difficulty concentrating, remembering, or making decisions?: No Does the patient have difficulty walking or climbing stairs?: No Does the patient have difficulty dressing or bathing?: No Does the patient have difficulty doing errands alone such as visiting a doctor's office or shopping?: No    Assessment & Plan  1. Encounter for routine history and physical exam for male  Discussed importance of 150 minutes of physical activity weekly, eat two servings of fish weekly, eat one serving of tree nuts ( cashews, pistachios, pecans, almonds.Marland Kitchen) every other day, eat 6 servings of fruit/vegetables daily and drink plenty of water and avoid sweet beverages.   2. Hyperlipidemia LDL goal <130  - Lipid panel  3. Other male erectile dysfunction  - CIALIS 20 MG tablet; Take 1 tablet (20 mg total) by mouth daily as needed for erectile  dysfunction.  Dispense: 6 tablet; Refill: 11  4. Screening for prostate cancer  Discussed USPTF    5. Primary insomnia  Mind is busy at night,   6. Sinus tachycardia  During colonoscopy last year, seen by Dr. Saunders Revel  7. Hypertension, uncontrolled  - CBC with Differential/Platelet - Comprehensive metabolic panel - TSH - Urine Microalbumin w/creat. ratio - amLODipine-valsartan (EXFORGE) 10-160 MG tablet; Take 1 tablet by mouth daily.  Dispense: 90 tablet; Refill: 1 ( not available at pharmacy) he also has anxiety, therefore we will change to Bystolic   Bystolic 10 mg daily for bp #90 and one refill   8. Diabetes mellitus screening  - Hemoglobin A1c  9. Needs flu shot  - Flu Vaccine QUAD 36+ mos IM   10. Colon cancer screening  - Cologuard

## 2017-06-11 NOTE — Addendum Note (Signed)
Addended by: Chilton Greathouse on: 06/11/2017 04:55 PM   Modules accepted: Orders

## 2017-06-11 NOTE — Addendum Note (Signed)
Addended by: Steele Sizer F on: 06/11/2017 04:09 PM   Modules accepted: Orders

## 2017-06-12 LAB — COMPREHENSIVE METABOLIC PANEL
AG Ratio: 1.3 (calc) (ref 1.0–2.5)
ALBUMIN MSPROF: 4.5 g/dL (ref 3.6–5.1)
ALKALINE PHOSPHATASE (APISO): 69 U/L (ref 40–115)
ALT: 20 U/L (ref 9–46)
AST: 21 U/L (ref 10–35)
BUN: 12 mg/dL (ref 7–25)
CO2: 24 mmol/L (ref 20–32)
Calcium: 9.6 mg/dL (ref 8.6–10.3)
Chloride: 103 mmol/L (ref 98–110)
Creat: 0.95 mg/dL (ref 0.70–1.33)
Globulin: 3.4 g/dL (calc) (ref 1.9–3.7)
Glucose, Bld: 94 mg/dL (ref 65–99)
POTASSIUM: 4.5 mmol/L (ref 3.5–5.3)
Sodium: 137 mmol/L (ref 135–146)
Total Bilirubin: 0.5 mg/dL (ref 0.2–1.2)
Total Protein: 7.9 g/dL (ref 6.1–8.1)

## 2017-06-12 LAB — CBC WITH DIFFERENTIAL/PLATELET
BASOS ABS: 28 {cells}/uL (ref 0–200)
Basophils Relative: 0.4 %
EOS ABS: 78 {cells}/uL (ref 15–500)
Eosinophils Relative: 1.1 %
HEMATOCRIT: 41 % (ref 38.5–50.0)
Hemoglobin: 14.1 g/dL (ref 13.2–17.1)
LYMPHS ABS: 1129 {cells}/uL (ref 850–3900)
MCH: 29.8 pg (ref 27.0–33.0)
MCHC: 34.4 g/dL (ref 32.0–36.0)
MCV: 86.7 fL (ref 80.0–100.0)
MPV: 10.1 fL (ref 7.5–12.5)
Monocytes Relative: 8.3 %
NEUTROS PCT: 74.3 %
Neutro Abs: 5275 cells/uL (ref 1500–7800)
Platelets: 279 10*3/uL (ref 140–400)
RBC: 4.73 10*6/uL (ref 4.20–5.80)
RDW: 12.8 % (ref 11.0–15.0)
Total Lymphocyte: 15.9 %
WBC: 7.1 10*3/uL (ref 3.8–10.8)
WBCMIX: 589 {cells}/uL (ref 200–950)

## 2017-06-12 LAB — TSH: TSH: 1.45 mIU/L (ref 0.40–4.50)

## 2017-06-12 LAB — LIPID PANEL
Cholesterol: 195 mg/dL (ref <200)
HDL: 57 mg/dL (ref >40)
LDL CHOLESTEROL (CALC): 121 mg/dL — AB
Non-HDL Cholesterol (Calc): 138 mg/dL (calc) — ABNORMAL HIGH (ref <130)
Total CHOL/HDL Ratio: 3.4 (calc) (ref <5.0)
Triglycerides: 71 mg/dL (ref <150)

## 2017-06-12 LAB — HEMOGLOBIN A1C
EAG (MMOL/L): 6.3 (calc)
Hgb A1c MFr Bld: 5.6 % of total Hgb (ref <5.7)
MEAN PLASMA GLUCOSE: 114 (calc)

## 2017-06-17 ENCOUNTER — Other Ambulatory Visit: Payer: Self-pay | Admitting: Family Medicine

## 2017-06-17 MED ORDER — ATORVASTATIN CALCIUM 40 MG PO TABS: 40.0000 mg | ORAL_TABLET | Freq: Every day | ORAL | 1 refills | Status: DC

## 2017-06-17 NOTE — Progress Notes (Signed)
atorvast

## 2017-06-18 ENCOUNTER — Telehealth: Payer: Self-pay | Admitting: Family Medicine

## 2017-06-18 NOTE — Telephone Encounter (Signed)
Spoke with patient this morning regarding labs and his Cholesterol being elevated and Dr. Ancil Boozer started him on new medication.

## 2017-06-18 NOTE — Telephone Encounter (Signed)
Copied from [place] [phone]. Topic: Inquiry >> Jun 18, 2017  9:51 AM Arletha Grippe wrote: Reason for CRM: please call pt [phone] He has not heard about labs, but got a call from [facility], and doesn't know what its about.

## 2017-12-10 ENCOUNTER — Encounter: Payer: Self-pay | Admitting: Family Medicine

## 2017-12-10 ENCOUNTER — Ambulatory Visit: Payer: Managed Care, Other (non HMO) | Admitting: Family Medicine

## 2017-12-10 VITALS — BP 128/88 | HR 65 | Temp 97.7°F | Resp 14 | Ht 73.0 in | Wt 168.4 lb

## 2017-12-10 DIAGNOSIS — F5101 Primary insomnia: Secondary | ICD-10-CM

## 2017-12-10 DIAGNOSIS — R000 Tachycardia, unspecified: Secondary | ICD-10-CM

## 2017-12-10 DIAGNOSIS — Z23 Encounter for immunization: Secondary | ICD-10-CM

## 2017-12-10 DIAGNOSIS — I10 Essential (primary) hypertension: Secondary | ICD-10-CM

## 2017-12-10 DIAGNOSIS — E785 Hyperlipidemia, unspecified: Secondary | ICD-10-CM | POA: Diagnosis not present

## 2017-12-10 DIAGNOSIS — Z1211 Encounter for screening for malignant neoplasm of colon: Secondary | ICD-10-CM

## 2017-12-10 MED ORDER — TRAZODONE HCL 50 MG PO TABS: 25.0000 mg | ORAL_TABLET | Freq: Every evening | ORAL | 0 refills | Status: DC | PRN

## 2017-12-10 MED ORDER — ZOSTER VAC RECOMB ADJUVANTED 50 MCG/0.5ML IM SUSR: 0.5000 mL | Freq: Once | INTRAMUSCULAR | 1 refills | Status: DC

## 2017-12-10 MED ORDER — NEBIVOLOL HCL 10 MG PO TABS: 10.0000 mg | ORAL_TABLET | Freq: Every day | ORAL | 1 refills | Status: DC

## 2017-12-10 MED ORDER — ATORVASTATIN CALCIUM 40 MG PO TABS: 40.0000 mg | ORAL_TABLET | Freq: Every day | ORAL | 1 refills | Status: DC

## 2017-12-10 NOTE — Progress Notes (Signed)
Name: Charles Valentine   MRN: [id number]    DOB: [birth year]   Date:12/10/2017       Progress Note  Subjective  Chief Complaint  Chief Complaint  Patient presents with  . Follow-up    6 mth f/u  . Hypertension  . Hyperlipidemia  . Sleeping Problem    patient has sleep shift disorder    HPI  HTN: diagnosed in 2018, he was  Norvasc 10 mg also tried Exforge but switching to Bystolic because of tachycardia. Seen by cardiologist . He does not like diuretics. Tolerating medication well, but DBP slightly elevated today, we will recheck it before he leaves our office.   Hyperlipidemia: ASCVD over 14 % , he is on aspirin 81 mg, taking atorvastatin since Feb 2019, he states had labs done for [facility] ( insurance) yesterday. No side effects of medication. Wants to hold off on rechecking labs here during his CPE in Feb.   ED: he is taking Cialis occasionally, able to have an erection without medication but not as hard as it used to. Unchanged   Insomnia: he worked at the same company for 40 years but it shut down last year and is doing another job as a Engineer, technical sales and gets frustrated, mind wonders at night, and has difficulty sleeping, also currently working 2nd shift and used to work 1st. He states feels tired because of lack of sleep, but still able to get busy and find joy with grandchildren and his wife. Loves family time. We will try Trazodone  Colon cancer screening: explained again the importance of screening, we will try hemoccult cards until he can take time off and have colonoscopy, he already has a cologuard at home but never collected specimen    Patient Active Problem List   Diagnosis Date Noted  . Sinus tachycardia 09/02/2016  . Essential hypertension 09/02/2016  . Eczema 06/08/2016  . Hyperlipidemia LDL goal <130 04/08/2016  . ED (erectile dysfunction)     History reviewed. No pertinent surgical history.  Family History  Problem Relation Age of Onset  . Hypertension  Mother   . Diabetes Mother   . Heart disease Mother 64       CABG  . Heart attack Mother 55  . Obesity Sister   . Drug abuse Brother     Social History   Socioeconomic History  . Marital status: Married    Spouse name: [name]  . Number of children: 2  . Years of education: Not on file  . Highest education level: 12th grade  Occupational History  . Occupation: Glass blower/designer    Comment: Banker  Social Needs  . Financial resource strain: Not hard at all  . Food insecurity:    Worry: Never true    Inability: Never true  . Transportation needs:    Medical: No    Non-medical: No  Tobacco Use  . Smoking status: Former Smoker    Packs/day: 0.50    Years: 10.00    Pack years: 5.00    Types: Cigarettes    Last attempt to quit: 1989    Years since quitting: 30.6  . Smokeless tobacco: Never Used  Substance and Sexual Activity  . Alcohol use: Yes    Alcohol/week: 8.0 standard drinks    Types: 6 Cans of beer, 2 Shots of liquor per week  . Drug use: No  . Sexual activity: Yes    Partners: Female    Birth control/protection: None  Lifestyle  .  Physical activity:    Days per week: 3 days    Minutes per session: 30 min  . Stress: To some extent  Relationships  . Social connections:    Talks on phone: Never    Gets together: More than three times a week    Attends religious service: 1 to 4 times per year    Active member of club or organization: No    Attends meetings of clubs or organizations: Never    Relationship status: Married  . Intimate partner violence:    Fear of current or ex partner: No    Emotionally abused: No    Physically abused: No    Forced sexual activity: No  Other Topics Concern  . 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  aspirin EC 81 MG tablet, Take 1 tablet (81 mg total) by mouth daily., Disp: 30 tablet, Rfl: 0 .  atorvastatin (LIPITOR) 40 MG tablet, Take 1 tablet (40 mg total) by mouth daily., Disp: 90 tablet, Rfl: 1 .  CIALIS 20 MG tablet, Take 1 tablet (20 mg total) by mouth daily as needed for erectile dysfunction., Disp: 6 tablet, Rfl: 11 .  nebivolol (BYSTOLIC) 10 MG tablet, Take 1 tablet (10 mg total) by mouth daily. In place of Exforge, Disp: 90 tablet, Rfl: 1 .  Multiple Vitamins-Minerals (MULTIVITAMIN) tablet, Take 1 tablet by mouth daily. (Patient not taking: Reported on 12/10/2017), Disp: 30 tablet, Rfl: 0 .  traZODone (DESYREL) 50 MG tablet, Take 0.5-1 tablets (25-50 mg total) by mouth at bedtime as needed for sleep., Disp: 30 tablet, Rfl: 0 .  Zoster Vaccine Adjuvanted (SHINGRIX) injection, Inject 0.5 mLs into the muscle once for 1 dose., Disp: 0.5 mL, Rfl: 1  No Known Allergies   ROS  Constitutional: Negative for fever or weight change.  Respiratory: Negative for cough and shortness of breath.   Cardiovascular: Negative for chest pain or palpitations.  Gastrointestinal: Negative for abdominal pain, no bowel changes.  Musculoskeletal: Negative for gait problem or joint swelling.  Skin: Negative for rash.  Neurological: Negative for dizziness or headache.  No other specific complaints in a complete review of systems (except as listed in HPI above).  Objective  Vitals:   12/10/17 0803  Pulse: 65  Resp: 14  Temp: 97.7 F (36.5 C)  TempSrc: Oral  SpO2: 99%  Weight: 168 lb 6.4 oz (76.4 kg)  Height: 6\' 1"  (1.854 m)    Body mass index is 22.22 kg/m.  Physical Exam  Constitutional: Patient appears well-developed and well-nourished.  No distress.  HEENT: head atraumatic, normocephalic, pupils equal and reactive to light, neck supple, throat within normal limits Cardiovascular: Normal rate, regular rhythm and normal heart sounds.  No murmur heard. No BLE  edema. Pulmonary/Chest: Effort normal and breath sounds normal. No respiratory distress. Abdominal: Soft.  There is no tenderness. Psychiatric: Patient has a normal mood and affect. behavior is normal. Judgment and thought content normal.  PHQ2/9: Depression screen [facility] 2/9 12/10/2017 06/11/2017 06/08/2016 04/08/2016 03/13/2015  Decreased Interest 0 0 0 0 0  Down, Depressed, Hopeless 0 0 0 0 0  PHQ - 2 Score 0 0 0 0 0  Altered sleeping 3 - - - -  Tired, decreased energy 1 - - - -  Change in appetite 0 - - - -  Feeling bad or failure about yourself  0 - - - -  Trouble concentrating 0 - - - -  Moving slowly or fidgety/restless 0 - - - -  Suicidal thoughts 0 - - - -  PHQ-9 Score 4 - - - -  Difficult doing work/chores Somewhat difficult - - - -   Secondary to insomnia   Fall Risk: Fall Risk  12/10/2017 06/11/2017 06/08/2016 04/08/2016 03/13/2015  Falls in the past year? No No No No No     [facility] Visit from 12/10/2017 in [facility]  AUDIT-C Score  2      Functional Status Survey: Is the patient deaf or have difficulty hearing?: No Does the patient have difficulty seeing, even when wearing glasses/contacts?: No Does the patient have difficulty concentrating, remembering, or making decisions?: No Does the patient have difficulty walking or climbing stairs?: No Does the patient have difficulty dressing or bathing?: No Does the patient have difficulty doing errands alone such as visiting a doctor's office or shopping?: No   Assessment & Plan  1. Hypertension, benign  - nebivolol (BYSTOLIC) 10 MG tablet; Take 1 tablet (10 mg total) by mouth daily. In place of Exforge  Dispense: 90 tablet; Refill: 1  2. Sinus tachycardia  - nebivolol (BYSTOLIC) 10 MG tablet; Take 1 tablet (10 mg total) by mouth daily. In place of Exforge  Dispense: 90 tablet; Refill: 1  3. Primary insomnia  - traZODone (DESYREL) 50 MG tablet; Take 0.5-1 tablets (25-50 mg total) by mouth at  bedtime as needed for sleep.  Dispense: 30 tablet; Refill: 0  4. Hyperlipidemia LDL goal <130  - atorvastatin (LIPITOR) 40 MG tablet; Take 1 tablet (40 mg total) by mouth daily.  Dispense: 90 tablet; Refill: 1  5. Needs flu shot  - Flu Vaccine QUAD 36+ mos IM  6. Need for shingles vaccine  - shingrix in our office   7. Colon cancer screening  She has cologuard at home, we will try hemoccult cards

## 2018-06-06 ENCOUNTER — Other Ambulatory Visit: Payer: Self-pay | Admitting: Family Medicine

## 2018-06-06 DIAGNOSIS — I10 Essential (primary) hypertension: Secondary | ICD-10-CM

## 2018-06-06 DIAGNOSIS — R000 Tachycardia, unspecified: Secondary | ICD-10-CM

## 2018-06-06 NOTE — Telephone Encounter (Signed)
Copied from [place] [id number]. Topic: Quick Communication - Rx Refill/Question >> Jun 06, 2018  4:29 PM Bea Graff, NT wrote: Medication: nebivolol (BYSTOLIC) 10 MG tablet   Requesting 3 pills to last until his appt on 06/13/2018   Has the patient contacted their pharmacy? Yes.   (Agent: If no, request that the patient contact the pharmacy for the refill.) (Agent: If yes, when and what did the pharmacy advise?)  Preferred Pharmacy (with phone number or street name): [place], [place] - [address] [facility] [phone] (Phone) [phone] (Fax)    Agent: Please be advised that RX refills may take up to 3 business days. We ask that you follow-up with your pharmacy.

## 2018-06-07 MED ORDER — NEBIVOLOL HCL 10 MG PO TABS: 10.0000 mg | ORAL_TABLET | Freq: Every day | ORAL | 0 refills | Status: DC

## 2018-06-13 ENCOUNTER — Encounter: Payer: Self-pay | Admitting: Family Medicine

## 2018-06-13 ENCOUNTER — Ambulatory Visit (INDEPENDENT_AMBULATORY_CARE_PROVIDER_SITE_OTHER): Payer: Managed Care, Other (non HMO) | Admitting: Family Medicine

## 2018-06-13 VITALS — BP 118/70 | HR 65 | Temp 97.9°F | Resp 16 | Ht 73.0 in | Wt 180.4 lb

## 2018-06-13 DIAGNOSIS — Z1211 Encounter for screening for malignant neoplasm of colon: Secondary | ICD-10-CM

## 2018-06-13 DIAGNOSIS — F5101 Primary insomnia: Secondary | ICD-10-CM | POA: Diagnosis not present

## 2018-06-13 DIAGNOSIS — I10 Essential (primary) hypertension: Secondary | ICD-10-CM

## 2018-06-13 DIAGNOSIS — R000 Tachycardia, unspecified: Secondary | ICD-10-CM

## 2018-06-13 DIAGNOSIS — E785 Hyperlipidemia, unspecified: Secondary | ICD-10-CM

## 2018-06-13 DIAGNOSIS — N528 Other male erectile dysfunction: Secondary | ICD-10-CM

## 2018-06-13 DIAGNOSIS — Z131 Encounter for screening for diabetes mellitus: Secondary | ICD-10-CM

## 2018-06-13 DIAGNOSIS — Z0000 Encounter for general adult medical examination without abnormal findings: Secondary | ICD-10-CM

## 2018-06-13 MED ORDER — TRAZODONE HCL 50 MG PO TABS: 25.0000 mg | ORAL_TABLET | Freq: Every evening | ORAL | 0 refills | Status: DC | PRN

## 2018-06-13 MED ORDER — ATORVASTATIN CALCIUM 40 MG PO TABS: 40.0000 mg | ORAL_TABLET | Freq: Every day | ORAL | 1 refills | Status: DC

## 2018-06-13 MED ORDER — NEBIVOLOL HCL 10 MG PO TABS: 10.0000 mg | ORAL_TABLET | Freq: Every day | ORAL | 1 refills | Status: DC

## 2018-06-13 MED ORDER — TADALAFIL 20 MG PO TABS: 20.0000 mg | ORAL_TABLET | Freq: Every day | ORAL | 1 refills | Status: DC | PRN

## 2018-06-13 NOTE — Progress Notes (Signed)
Name: Charles Valentine   MRN: [id number]    DOB: 09-Nov-1955   Date:06/13/2018       Progress Note  Subjective  Chief Complaint  Chief Complaint  Patient presents with  . Annual Exam    HPI  Patient presents for annual CPE and follow up  HTN: diagnosed in 2018, he was  Norvasc 10 mg also tried Exforge but switching to Bystolic because of tachycardia. Seen by cardiologist, Dr. Saunders Revel . He does not like diuretics. Tolerating medication well, BP is at goal, no side effects of medication   Hyperlipidemia: ASCVD over 14 % , he is on aspirin 81 mg, taking atorvastatin since Feb 2019, we will recheck labs today. No myalgia or chest pain   ED: he is taking Cialis occasionally, able to have an erection without medication but not as hard as it used to. He needs a refill   Weight gain: he states he retired from full time job Fall of 2019 and has not been as active since. He will try to decrease portion size   Insomnia: he is doing better since he retired, not taking medication at this time  Colon cancer screening: previously he said did not have time to have colonoscopy, we gave him cologuard but he did not do it, now he states has other expenses. Explained again importance of screening.   USPSTF grade A and B recommendations:  Diet: decreasing coffee intake, avoiding sodas, drinking more water  Exercise: he walks up the stairs for 10-15 minutes   Depression:  Depression screen [facility] 2/9 06/13/2018 12/10/2017 06/11/2017 06/08/2016 04/08/2016  Decreased Interest 0 0 0 0 0  Down, Depressed, Hopeless 0 0 0 0 0  PHQ - 2 Score 0 0 0 0 0  Altered sleeping 0 3 - - -  Tired, decreased energy 0 1 - - -  Change in appetite 0 0 - - -  Feeling bad or failure about yourself  0 0 - - -  Trouble concentrating 0 0 - - -  Moving slowly or fidgety/restless 0 0 - - -  Suicidal thoughts 0 0 - - -  PHQ-9 Score 0 4 - - -  Difficult doing work/chores Not difficult at all Somewhat difficult - - -     Hypertension:  BP Readings from Last 3 Encounters:  06/13/18 118/70  12/10/17 128/88  06/11/17 140/70    Obesity: Wt Readings from Last 3 Encounters:  06/13/18 180 lb 6.4 oz (81.8 kg)  12/10/17 168 lb 6.4 oz (76.4 kg)  06/11/17 171 lb 6.4 oz (77.7 kg)   BMI Readings from Last 3 Encounters:  06/13/18 23.80 kg/m  12/10/17 22.22 kg/m  06/11/17 22.93 kg/m     Lipids:  Lab Results  Component Value Date   CHOL 195 06/11/2017   CHOL 213 (H) 06/08/2016   CHOL 203 (H) 03/13/2015   Lab Results  Component Value Date   HDL 57 06/11/2017   HDL 54 06/08/2016   HDL 51 03/13/2015   Lab Results  Component Value Date   LDLCALC 121 (H) 06/11/2017   LDLCALC 146 (H) 06/08/2016   LDLCALC 136 (H) 03/13/2015   Lab Results  Component Value Date   TRIG 71 06/11/2017   TRIG 65 06/08/2016   TRIG 81 03/13/2015   Lab Results  Component Value Date   CHOLHDL 3.4 06/11/2017   CHOLHDL 3.9 06/08/2016   CHOLHDL 4.0 03/13/2015   No results found for: LDLDIRECT Glucose:  Glucose, Bld  Date Value Ref  Range Status  06/11/2017 94 65 - 99 mg/dL Final    Comment:    .            Fasting reference interval .   12/09/2016 104 (H) 65 - 99 mg/dL Final  07/03/2016 88 65 - 99 mg/dL Final      Office Visit from 06/13/2018 in [facility]  AUDIT-C Score  0       Married STD testing and prevention (HIV/chl/gon/syphilis): N/A Hep C: up to date   Skin cancer: discussed atypical lesions  Colorectal cancer: he is due for test,, but not ready, discussed risk of colon cancer death  Prostate cancer: discussed USPTF   Lab Results  Component Value Date   PSA 0.7 06/08/2016    IPSS Questionnaire (AUA-7): Over the past month.   1)  How often have you had a sensation of not emptying your bladder completely after you finish urinating?  1 - Less than 1 time in 5  2)  How often have you had to urinate again less than two hours after you finished urinating? 0 - Not at all   3)  How often have you found you stopped and started again several times when you urinated?  0 - Not at all  4) How difficult have you found it to postpone urination?  0 - Not at all  5) How often have you had a weak urinary stream?  0 - Not at all  6) How often have you had to push or strain to begin urination?  0 - Not at all  7) How many times did you most typically get up to urinate from the time you went to bed until the time you got up in the morning?  1 - 1 time  Total score:  0-7 mildly symptomatic   8-19 moderately symptomatic   20-35 severely symptomatic    Lung cancer:  Low Dose CT Chest recommended if Age 93-80 years, 30 pack-year currently smoking OR have quit w/in 15years. Patient does not qualify.   AAA:  The USPSTF recommends one-time screening with ultrasonography in men ages 109 to 88 years who have ever smoked, start at age 35  ECG:  2018   Advanced Care Planning: A voluntary discussion about advance care planning including the explanation and discussion of advance directives.  Discussed health care proxy and Living will, and the patient was able to identify a health care proxy as wife.  Patient does not have a living will at present time.  Patient Active Problem List   Diagnosis Date Noted  . Sinus tachycardia 09/02/2016  . Essential hypertension 09/02/2016  . Eczema 06/08/2016  . Hyperlipidemia LDL goal <130 04/08/2016  . ED (erectile dysfunction)     No past surgical history on file.  Family History  Problem Relation Age of Onset  . Hypertension Mother   . Diabetes Mother   . Heart disease Mother 69       CABG  . Heart attack Mother 42  . Obesity Sister   . Drug abuse Brother     Social History   Socioeconomic History  . Marital status: Married    Spouse name: [name]  . Number of children: 2  . Years of education: Not on file  . Highest education level: 12th grade  Occupational History  . Occupation: Glass blower/designer    Comment: Engineer, manufacturing systems  Social Needs  . Financial resource strain: Not hard at all  .  Food insecurity:    Worry: Never true    Inability: Never true  . Transportation needs:    Medical: No    Non-medical: No  Tobacco Use  . Smoking status: Former Smoker    Packs/day: 0.50    Years: 10.00    Pack years: 5.00    Types: Cigarettes    Last attempt to quit: 1989    Years since quitting: 31.1  . Smokeless tobacco: Never Used  Substance and Sexual Activity  . Alcohol use: Yes    Alcohol/week: 8.0 standard drinks    Types: 6 Cans of beer, 2 Shots of liquor per week  . Drug use: No  . Sexual activity: Yes    Partners: Female    Birth control/protection: None  Lifestyle  . Physical activity:    Days per week: 3 days    Minutes per session: 30 min  . Stress: To some extent  Relationships  . Social connections:    Talks on phone: Never    Gets together: More than three times a week    Attends religious service: 1 to 4 times per year    Active member of club or organization: No    Attends meetings of clubs or organizations: Never    Relationship status: Married  . Intimate partner violence:    Fear of current or ex partner: No    Emotionally abused: No    Physically abused: No    Forced sexual activity: No  Other Topics Concern  . 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  aspirin EC 81 MG tablet, Take 1 tablet (81 mg total) by mouth daily., Disp: 30 tablet, Rfl: 0 .  atorvastatin (LIPITOR) 40 MG tablet, Take 1 tablet (40 mg total) by mouth daily., Disp: 90 tablet, Rfl: 1 .  nebivolol (BYSTOLIC) 10 MG tablet, Take 1 tablet (10 mg total) by mouth daily. In place of Exforge, Disp: 90 tablet, Rfl: 1 .  traZODone (DESYREL) 50 MG tablet, Take 0.5-1  tablets (25-50 mg total) by mouth at bedtime as needed for sleep., Disp: 30 tablet, Rfl: 0 .  Multiple Vitamins-Minerals (MULTIVITAMIN) tablet, Take 1 tablet by mouth daily. (Patient not taking: Reported on 12/10/2017), Disp: 30 tablet, Rfl: 0 .  tadalafil (ADCIRCA/CIALIS) 20 MG tablet, Take 1 tablet (20 mg total) by mouth daily as needed for erectile dysfunction., Disp: 30 tablet, Rfl: 1  No Known Allergies   ROS  Constitutional: Negative for fever , positive for weight change.  Respiratory: Negative for cough and shortness of breath.   Cardiovascular: Negative for chest pain or palpitations.  Gastrointestinal: Negative for abdominal pain, no bowel changes.  Musculoskeletal: Negative for gait problem or joint swelling.  Skin: Negative for rash.  Neurological: Negative for dizziness or headache.  No other specific complaints in a complete review of systems (except as listed in HPI above).   Objective  Vitals:   06/13/18 0936  BP: 118/70  Pulse: 65  Resp: 16  Temp: 97.9 F (36.6 C)  TempSrc: Oral  SpO2: 98%  Weight: 180 lb 6.4 oz (81.8 kg)  Height: 6\' 1"  (1.854 m)    Body mass index is 23.8 kg/m.  Physical Exam  Constitutional: Patient appears well-developed and well-nourished. No distress.  HENT: Head: Normocephalic and atraumatic. Ears: B TMs ok, no erythema or effusion; Nose: Nose normal. Mouth/Throat: Oropharynx is clear and moist. No oropharyngeal exudate.  Eyes: Conjunctivae and EOM are normal. Pupils are equal, round, and reactive to light. No scleral icterus.  Neck: Normal range of motion. Neck supple. No JVD present. No thyromegaly present.  Cardiovascular: Normal rate, regular rhythm and normal heart sounds.  No murmur heard. No BLE edema. Pulmonary/Chest: Effort normal and breath sounds normal. No respiratory distress. Abdominal: Soft. Bowel sounds are normal, no distension. There is no tenderness. no masses MALE GENITALIA: Normal descended testes  bilaterally RECTAL: Prostate normal size and consistency, no rectal masses or hemorrhoids  Musculoskeletal: Normal range of motion, no joint effusions. No gross deformities Neurological: he is alert and oriented to person, place, and time. No cranial nerve deficit. Coordination, balance, strength, speech and gait are normal.  Skin: Skin is warm and dry. No rash noted. No erythema.  Psychiatric: Patient has a normal mood and affect. behavior is normal. Judgment and thought content normal.   PHQ2/9: Depression screen [facility] 2/9 06/13/2018 12/10/2017 06/11/2017 06/08/2016 04/08/2016  Decreased Interest 0 0 0 0 0  Down, Depressed, Hopeless 0 0 0 0 0  PHQ - 2 Score 0 0 0 0 0  Altered sleeping 0 3 - - -  Tired, decreased energy 0 1 - - -  Change in appetite 0 0 - - -  Feeling bad or failure about yourself  0 0 - - -  Trouble concentrating 0 0 - - -  Moving slowly or fidgety/restless 0 0 - - -  Suicidal thoughts 0 0 - - -  PHQ-9 Score 0 4 - - -  Difficult doing work/chores Not difficult at all Somewhat difficult - - -     Fall Risk: Fall Risk  06/13/2018 12/10/2017 06/11/2017 06/08/2016 04/08/2016  Falls in the past year? 0 No No No No  Number falls in past yr: 0 - - - -  Injury with Fall? 0 - - - -  Follow up Falls evaluation completed - - - -     Assessment & Plan  1. Hyperlipidemia LDL goal <130  - atorvastatin (LIPITOR) 40 MG tablet; Take 1 tablet (40 mg total) by mouth daily.  Dispense: 90 tablet; Refill: 1 - Lipid panel  2. Sinus tachycardia  - nebivolol (BYSTOLIC) 10 MG tablet; Take 1 tablet (10 mg total) by mouth daily. In place of Exforge  Dispense: 90 tablet; Refill: 1  3. Hypertension, benign  - nebivolol (BYSTOLIC) 10 MG tablet; Take 1 tablet (10 mg total) by mouth daily. In place of Exforge  Dispense: 90 tablet; Refill: 1 - COMPLETE METABOLIC PANEL WITH GFR - CBC with Differential/Platelet  4. Primary insomnia  - traZODone (DESYREL) 50 MG tablet; Take 0.5-1 tablets  (25-50 mg total) by mouth at bedtime as needed for sleep.  Dispense: 30 tablet; Refill: 0  5. Diabetes mellitus screening  - Hemoglobin A1c  6. Other male erectile dysfunction  Continue Cialis   7. Encounter for routine history and physical exam for male   8. Colon cancer screening  He states he is not ready to have it done, he will call us back when ready to be scheduled    -Prostate cancer screening and PSA options (with potential risks and benefits of testing vs not testing) were discussed along with recent recs/guidelines. -USPSTF grade A and B recommendations reviewed with patient;  age-appropriate recommendations, preventive care, screening tests, etc discussed and encouraged; healthy living encouraged; see AVS for patient education given to patient -Discussed importance of 150 minutes of physical activity weekly, eat two servings of fish weekly, eat one serving of tree nuts ( cashews, pistachios, pecans, almonds.Marland Kitchen) every other day, eat 6 servings of fruit/vegetables daily and drink plenty of water and avoid sweet beverages.

## 2018-06-13 NOTE — Patient Instructions (Signed)
Preventive Care 40-64 Years, Male Preventive care refers to lifestyle choices and visits with your health care provider that can promote health and wellness. What does preventive care include?   A yearly physical exam. This is also called an annual well check.  Dental exams once or twice a year.  Routine eye exams. Ask your health care provider how often you should have your eyes checked.  Personal lifestyle choices, including: ? Daily care of your teeth and gums. ? Regular physical activity. ? Eating a healthy diet. ? Avoiding tobacco and drug use. ? Limiting alcohol use. ? Practicing safe sex. ? Taking low-dose aspirin every day starting at age 50. What happens during an annual well check? The services and screenings done by your health care provider during your annual well check will depend on your age, overall health, lifestyle risk factors, and family history of disease. Counseling Your health care provider may ask you questions about your:  Alcohol use.  Tobacco use.  Drug use.  Emotional well-being.  Home and relationship well-being.  Sexual activity.  Eating habits.  Work and work environment. Screening You may have the following tests or measurements:  Height, weight, and BMI.  Blood pressure.  Lipid and cholesterol levels. These may be checked every 5 years, or more frequently if you are over 50 years old.  Skin check.  Lung cancer screening. You may have this screening every year starting at age 55 if you have a 30-pack-year history of smoking and currently smoke or have quit within the past 15 years.  Colorectal cancer screening. All adults should have this screening starting at age 50 and continuing until age 75. Your health care provider may recommend screening at age 45. You will have tests every 1-10 years, depending on your results and the type of screening test. People at increased risk should start screening at an earlier age. Screening tests may  include: ? Guaiac-based fecal occult blood testing. ? Fecal immunochemical test (FIT). ? Stool DNA test. ? Virtual colonoscopy. ? Sigmoidoscopy. During this test, a flexible tube with a tiny camera (sigmoidoscope) is used to examine your rectum and lower colon. The sigmoidoscope is inserted through your anus into your rectum and lower colon. ? Colonoscopy. During this test, a long, thin, flexible tube with a tiny camera (colonoscope) is used to examine your entire colon and rectum.  Prostate cancer screening. Recommendations will vary depending on your family history and other risks.  Hepatitis C blood test.  Hepatitis B blood test.  Sexually transmitted disease (STD) testing.  Diabetes screening. This is done by checking your blood sugar (glucose) after you have not eaten for a while (fasting). You may have this done every 1-3 years. Discuss your test results, treatment options, and if necessary, the need for more tests with your health care provider. Vaccines Your health care provider may recommend certain vaccines, such as:  Influenza vaccine. This is recommended every year.  Tetanus, diphtheria, and acellular pertussis (Tdap, Td) vaccine. You may need a Td booster every 10 years.  Varicella vaccine. You may need this if you have not been vaccinated.  Zoster vaccine. You may need this after age 60.  Measles, mumps, and rubella (MMR) vaccine. You may need at least one dose of MMR if you were born in 1957 or later. You may also need a second dose.  Pneumococcal 13-valent conjugate (PCV13) vaccine. You may need this if you have certain conditions and have not been vaccinated.  Pneumococcal polysaccharide (PPSV23) vaccine.   You may need one or two doses if you smoke cigarettes or if you have certain conditions.  Meningococcal vaccine. You may need this if you have certain conditions.  Hepatitis A vaccine. You may need this if you have certain conditions or if you travel or work in  places where you may be exposed to hepatitis A.  Hepatitis B vaccine. You may need this if you have certain conditions or if you travel or work in places where you may be exposed to hepatitis B.  Haemophilus influenzae type b (Hib) vaccine. You may need this if you have certain risk factors. Talk to your health care provider about which screenings and vaccines you need and how often you need them. This information is not intended to replace advice given to you by your health care provider. Make sure you discuss any questions you have with your health care provider. Document Released: 05/03/2015 Document Revised: 05/27/2017 Document Reviewed: 02/05/2015 Elsevier Interactive Patient Education  2019 Elsevier Inc.  

## 2018-06-14 LAB — COMPLETE METABOLIC PANEL WITH GFR
AG Ratio: 1.3 (calc) (ref 1.0–2.5)
ALBUMIN MSPROF: 4.3 g/dL (ref 3.6–5.1)
ALT: 23 U/L (ref 9–46)
AST: 21 U/L (ref 10–35)
Alkaline phosphatase (APISO): 62 U/L (ref 35–144)
BUN: 14 mg/dL (ref 7–25)
CALCIUM: 9.7 mg/dL (ref 8.6–10.3)
CO2: 25 mmol/L (ref 20–32)
CREATININE: 0.95 mg/dL (ref 0.70–1.25)
Chloride: 103 mmol/L (ref 98–110)
GFR, EST NON AFRICAN AMERICAN: 87 mL/min/{1.73_m2} (ref >=60)
GFR, Est African American: 100 mL/min/{1.73_m2} (ref >=60)
GLOBULIN: 3.4 g/dL (ref 1.9–3.7)
Glucose, Bld: 88 mg/dL (ref 65–99)
Potassium: 4.6 mmol/L (ref 3.5–5.3)
Sodium: 137 mmol/L (ref 135–146)
Total Bilirubin: 0.5 mg/dL (ref 0.2–1.2)
Total Protein: 7.7 g/dL (ref 6.1–8.1)

## 2018-06-14 LAB — CBC WITH DIFFERENTIAL/PLATELET
ABSOLUTE MONOCYTES: 609 {cells}/uL (ref 200–950)
BASOS PCT: 0.2 %
Basophils Absolute: 17 cells/uL (ref 0–200)
EOS ABS: 70 {cells}/uL (ref 15–500)
Eosinophils Relative: 0.8 %
HCT: 44.2 % (ref 38.5–50.0)
Hemoglobin: 15.1 g/dL (ref 13.2–17.1)
Lymphs Abs: 1253 cells/uL (ref 850–3900)
MCH: 30.1 pg (ref 27.0–33.0)
MCHC: 34.2 g/dL (ref 32.0–36.0)
MCV: 88.2 fL (ref 80.0–100.0)
MPV: 10.4 fL (ref 7.5–12.5)
Monocytes Relative: 7 %
NEUTROS PCT: 77.6 %
Neutro Abs: 6751 cells/uL (ref 1500–7800)
PLATELETS: 263 10*3/uL (ref 140–400)
RBC: 5.01 10*6/uL (ref 4.20–5.80)
RDW: 12.9 % (ref 11.0–15.0)
Total Lymphocyte: 14.4 %
WBC: 8.7 10*3/uL (ref 3.8–10.8)

## 2018-06-14 LAB — HEMOGLOBIN A1C
EAG (MMOL/L): 6.2 (calc)
HEMOGLOBIN A1C: 5.5 %{Hb} (ref <5.7)
Mean Plasma Glucose: 111 (calc)

## 2018-06-14 LAB — LIPID PANEL
Cholesterol: 223 mg/dL — ABNORMAL HIGH (ref <200)
HDL: 58 mg/dL (ref >=40)
LDL CHOLESTEROL (CALC): 147 mg/dL — AB
NON-HDL CHOLESTEROL (CALC): 165 mg/dL — AB (ref <130)
Total CHOL/HDL Ratio: 3.8 (calc) (ref <5.0)
Triglycerides: 75 mg/dL (ref <150)

## 2018-10-14 LAB — HEMOGLOBIN A1C: Hemoglobin A1C: 5.7

## 2018-10-14 LAB — LIPID PANEL
Cholesterol: 151 (ref 0–200)
HDL: 55 (ref 35–70)
LDL Cholesterol: 84
Triglycerides: 60 (ref 40–160)

## 2018-10-14 LAB — BASIC METABOLIC PANEL
Creatinine: 1.2 (ref 0.6–1.3)
Glucose: 109

## 2018-10-19 ENCOUNTER — Encounter: Payer: Self-pay | Admitting: Family Medicine

## 2018-10-26 ENCOUNTER — Encounter: Payer: Self-pay | Admitting: Family Medicine

## 2018-10-26 ENCOUNTER — Other Ambulatory Visit: Payer: Self-pay

## 2018-10-26 ENCOUNTER — Ambulatory Visit: Payer: Managed Care, Other (non HMO) | Admitting: Family Medicine

## 2018-10-26 ENCOUNTER — Other Ambulatory Visit: Payer: Self-pay | Admitting: Family Medicine

## 2018-10-26 VITALS — BP 180/100 | HR 63 | Temp 96.8°F | Resp 16 | Ht 73.0 in | Wt 182.9 lb

## 2018-10-26 DIAGNOSIS — R000 Tachycardia, unspecified: Secondary | ICD-10-CM

## 2018-10-26 DIAGNOSIS — F5101 Primary insomnia: Secondary | ICD-10-CM | POA: Diagnosis not present

## 2018-10-26 DIAGNOSIS — I10 Essential (primary) hypertension: Secondary | ICD-10-CM

## 2018-10-26 DIAGNOSIS — Z1211 Encounter for screening for malignant neoplasm of colon: Secondary | ICD-10-CM

## 2018-10-26 MED ORDER — NEBIVOLOL HCL 20 MG PO TABS: 20.0000 mg | ORAL_TABLET | Freq: Every day | ORAL | 0 refills | Status: DC

## 2018-10-26 MED ORDER — HYDROCHLOROTHIAZIDE 12.5 MG PO TABS: 12.5000 mg | ORAL_TABLET | Freq: Every day | ORAL | 0 refills | Status: DC

## 2018-10-26 MED ORDER — TRAZODONE HCL 50 MG PO TABS: 25.0000 mg | ORAL_TABLET | Freq: Every evening | ORAL | 0 refills | Status: DC | PRN

## 2018-10-26 NOTE — Progress Notes (Signed)
Name: Charles Valentine   MRN: [id number]    DOB: 1956-01-17   Date:10/26/2018       Progress Note  Subjective  Chief Complaint  Chief Complaint  Patient presents with  . Hypertension    Denies any symptoms-states BP runs around 140/80 at home  . Follow-up    Discuss blood work    HPI  HTN: he is here today because he went to his wife's work screen and bp was high at 171/96. He states his bp at home has also been elevated in the 160's lately. He does not smoke, drinks beers on weekends only, he only drinks 6-8 oz daily of coffee daily , exercises, previous urine micro negative, reviewed recent labs, we will add TSH and urine micro today, discussed DASH diet, we will adjust medication and close follow up. He states he is only sleeping about 4 hours per night, his mind is busy but has not been taking Trazodone as prescribed, explained lack of sleep can raise bp. He denies headaches, palpitation, chest pain, double vision, weakness   Patient Active Problem List   Diagnosis Date Noted  . Sinus tachycardia 09/02/2016  . Essential hypertension 09/02/2016  . Eczema 06/08/2016  . Hyperlipidemia LDL goal <130 04/08/2016  . ED (erectile dysfunction)     History reviewed. No pertinent surgical history.  Family History  Problem Relation Age of Onset  . Hypertension Mother   . Diabetes Mother   . Heart disease Mother 87       CABG  . Heart attack Mother 84  . Obesity Sister   . Drug abuse Brother     Social History   Socioeconomic History  . Marital status: Married    Spouse name: [name]  . Number of children: 2  . Years of education: Not on file  . Highest education level: 12th grade  Occupational History  . Occupation: Glass blower/designer    Comment: Banker  Social Needs  . Financial resource strain: Not hard at all  . Food insecurity    Worry: Never true    Inability: Never true  . Transportation needs    Medical: No    Non-medical: No  Tobacco Use  .  Smoking status: Former Smoker    Packs/day: 0.50    Years: 10.00    Pack years: 5.00    Types: Cigarettes    Quit date: 1989    Years since quitting: 31.5  . Smokeless tobacco: Never Used  Substance and Sexual Activity  . Alcohol use: Yes    Alcohol/week: 8.0 standard drinks    Types: 6 Cans of beer, 2 Shots of liquor per week  . Drug use: No  . Sexual activity: Yes    Partners: Female    Birth control/protection: None  Lifestyle  . Physical activity    Days per week: 3 days    Minutes per session: 30 min  . Stress: Very much  Relationships  . Social Herbalist on phone: Never    Gets together: More than three times a week    Attends religious service: 1 to 4 times per year    Active member of club or organization: No    Attends meetings of clubs or organizations: Never    Relationship status: Married  . Intimate partner violence    Fear of current or ex partner: No    Emotionally abused: No    Physically abused: No    Forced  sexual activity: No  Other Topics Concern  . 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  aspirin EC 81 MG tablet, Take 1 tablet (81 mg total) by mouth daily., Disp: 30 tablet, Rfl: 0 .  atorvastatin (LIPITOR) 40 MG tablet, Take 1 tablet (40 mg total) by mouth daily., Disp: 90 tablet, Rfl: 1 .  Multiple Vitamins-Minerals (MULTIVITAMIN) tablet, Take 1 tablet by mouth daily., Disp: 30 tablet, Rfl: 0 .  Nebivolol HCl 20 MG TABS, Take 1 tablet (20 mg total) by mouth daily. In place of Exforge, Disp: 90 tablet, Rfl: 0 .  tadalafil (ADCIRCA/CIALIS) 20 MG tablet, Take 1 tablet (20 mg total) by mouth daily as needed for erectile dysfunction., Disp: 30 tablet, Rfl: 1 .  traZODone (DESYREL) 50 MG tablet, Take  0.5-1 tablets (25-50 mg total) by mouth at bedtime as needed for sleep., Disp: 90 tablet, Rfl: 0 .  hydrochlorothiazide (HYDRODIURIL) 12.5 MG tablet, Take 1 tablet (12.5 mg total) by mouth daily., Disp: 30 tablet, Rfl: 0  No Known Allergies  I personally reviewed active problem list, medication list, allergies, family history, social history with the patient/caregiver today.   ROS  Ten systems reviewed and is negative except as mentioned in HPI   Objective  Vitals:   10/26/18 1057 10/26/18 1113  BP: (!) 164/86 (!) 180/100  Pulse: 63   Resp: 16   Temp: (!) 96.8 F (36 C)   TempSrc: Temporal   SpO2: 99%   Weight: 182 lb 14.4 oz (83 kg)   Height: 6\' 1"  (1.854 m)     Body mass index is 24.13 kg/m.  Physical Exam  Constitutional: Patient appears well-developed and well-nourished.  No distress.  HEENT: head atraumatic, normocephalic, pupils equal and reactive to light,neck supple Cardiovascular: Normal rate, regular rhythm and normal heart sounds.  No murmur heard. No BLE edema. Pulmonary/Chest: Effort normal and breath sounds normal. No respiratory distress. Abdominal: Soft.  There is no tenderness. Psychiatric: Patient has a normal mood and affect. behavior is normal. Judgment and thought content normal.  Recent Results (from the past 2160 hour(s))  Basic metabolic panel     Status: None   Collection Time: 10/14/18 12:00 AM  Result Value Ref Range   Glucose 109    Creatinine 1.2 0.6 - 1.3  Lipid panel     Status: None   Collection Time: 10/14/18 12:00 AM  Result Value Ref Range   Triglycerides 60 40 - 160   Cholesterol 151 0 - 200   HDL 55 35 - 70   LDL Cholesterol 84   Hemoglobin A1c     Status: None   Collection Time: 10/14/18 12:00 AM  Result Value Ref Range   Hemoglobin A1C 5.7      PHQ2/9: Depression screen [facility] 2/9 10/26/2018 06/13/2018 12/10/2017 06/11/2017 06/08/2016  Decreased Interest 0 0 0 0 0  Down, Depressed, Hopeless 0 0 0 0 0  PHQ - 2 Score 0 0 0 0 0   Altered sleeping 2 0 3 - -  Tired, decreased energy 0 0 1 - -  Change in appetite 0 0 0 - -  Feeling bad or failure about yourself  0 0 0 - -  Trouble concentrating 0 0 0 - -  Moving slowly or fidgety/restless 0 0 0 - -  Suicidal thoughts 0 0 0 - -  PHQ-9 Score 2 0 4 - -  Difficult doing work/chores Not difficult at all Not difficult at all Somewhat difficult - -    phq 9 is negative   Fall Risk: Fall Risk  10/26/2018 06/13/2018 12/10/2017 06/11/2017 06/08/2016  Falls in the past year? 0 0 No No No  Number falls in past yr: 0 0 - - -  Injury with Fall? 0 0 - - -  Follow up - Falls evaluation completed - - -      Functional Status Survey: Is the patient deaf or have difficulty hearing?: No Does the patient have difficulty seeing, even when wearing glasses/contacts?: No Does the patient have difficulty concentrating, remembering, or making decisions?: No Does the patient have difficulty walking or climbing stairs?: No Does the patient have difficulty dressing or bathing?: No Does the patient have difficulty doing errands alone such as visiting a doctor's office or shopping?: No    Assessment & Plan   1. Sinus tachycardia  - Nebivolol HCl 20 MG TABS; Take 1 tablet (20 mg total) by mouth daily. In place of Exforge  Dispense: 90 tablet; Refill: 0  2. Hypertension, benign  - TSH - Microalbumin / creatinine urine ratio - Nebivolol HCl 20 MG TABS; Take 1 tablet (20 mg total) by mouth daily. In place of Exforge  Dispense: 90 tablet; Refill: 0 - hydrochlorothiazide (HYDRODIURIL) 12.5 MG tablet; Take 1 tablet (12.5 mg total) by mouth daily.  Dispense: 30 tablet; Refill: 0  3. Primary insomnia  - traZODone (DESYREL) 50 MG tablet; Take 0.5-1 tablets (25-50 mg total) by mouth at bedtime as needed for sleep.  Dispense: 90 tablet; Refill: 0  4. Colon cancer screening  - Cologuard

## 2018-10-26 NOTE — Patient Instructions (Addendum)
Take two of the Bystolic 10 mg and one of the HCTZ 12.5 mg ( rx at [facility] )   DASH Eating Plan DASH stands for "Dietary Approaches to Stop Hypertension." The DASH eating plan is a healthy eating plan that has been shown to reduce high blood pressure (hypertension). It may also reduce your risk for type 2 diabetes, heart disease, and stroke. The DASH eating plan may also help with weight loss. What are tips for following this plan?  General guidelines  Avoid eating more than 2,300 mg (milligrams) of salt (sodium) a day. If you have hypertension, you may need to reduce your sodium intake to 1,500 mg a day.  Limit alcohol intake to no more than 1 drink a day for nonpregnant women and 2 drinks a day for men. One drink equals 12 oz of beer, 5 oz of wine, or 1 oz of hard liquor.  Work with your health care provider to maintain a healthy body weight or to lose weight. Ask what an ideal weight is for you.  Get at least 30 minutes of exercise that causes your heart to beat faster (aerobic exercise) most days of the week. Activities may include walking, swimming, or biking.  Work with your health care provider or diet and nutrition specialist (dietitian) to adjust your eating plan to your individual calorie needs. Reading food labels   Check food labels for the amount of sodium per serving. Choose foods with less than 5 percent of the Daily Value of sodium. Generally, foods with less than 300 mg of sodium per serving fit into this eating plan.  To find whole grains, look for the word "whole" as the first word in the ingredient list. Shopping  Buy products labeled as "low-sodium" or "no salt added."  Buy fresh foods. Avoid canned foods and premade or frozen meals. Cooking  Avoid adding salt when cooking. Use salt-free seasonings or herbs instead of table salt or sea salt. Check with your health care provider or pharmacist before using salt substitutes.  Do not fry foods. Cook foods using  healthy methods such as baking, boiling, grilling, and broiling instead.  Cook with heart-healthy oils, such as olive, canola, soybean, or sunflower oil. Meal planning  Eat a balanced diet that includes: ? 5 or more servings of fruits and vegetables each day. At each meal, try to fill half of your plate with fruits and vegetables. ? Up to 6-8 servings of whole grains each day. ? Less than 6 oz of lean meat, poultry, or fish each day. A 3-oz serving of meat is about the same size as a deck of cards. One egg equals 1 oz. ? 2 servings of low-fat dairy each day. ? A serving of nuts, seeds, or beans 5 times each week. ? Heart-healthy fats. Healthy fats called Omega-3 fatty acids are found in foods such as flaxseeds and coldwater fish, like sardines, salmon, and mackerel.  Limit how much you eat of the following: ? Canned or prepackaged foods. ? Food that is high in trans fat, such as fried foods. ? Food that is high in saturated fat, such as fatty meat. ? Sweets, desserts, sugary drinks, and other foods with added sugar. ? Full-fat dairy products.  Do not salt foods before eating.  Try to eat at least 2 vegetarian meals each week.  Eat more home-cooked food and less restaurant, buffet, and fast food.  When eating at a restaurant, ask that your food be prepared with less salt or no  salt, if possible. What foods are recommended? The items listed may not be a complete list. Talk with your dietitian about what dietary choices are best for you. Grains Whole-grain or whole-wheat bread. Whole-grain or whole-wheat pasta. Brown rice. [place]. Bulgur. Whole-grain and low-sodium cereals. Pita bread. Low-fat, low-sodium crackers. Whole-wheat flour tortillas. Vegetables Fresh or frozen vegetables (raw, steamed, roasted, or grilled). Low-sodium or reduced-sodium tomato and vegetable juice. Low-sodium or reduced-sodium tomato sauce and tomato paste. Low-sodium or reduced-sodium canned  vegetables. Fruits All fresh, dried, or frozen fruit. Canned fruit in natural juice (without added sugar). Meat and other protein foods Skinless chicken or [place]. Ground chicken or [place]. Pork with fat trimmed off. Fish and seafood. Egg whites. Dried beans, peas, or lentils. Unsalted nuts, nut butters, and seeds. Unsalted canned beans. Lean cuts of beef with fat trimmed off. Low-sodium, lean deli meat. Dairy Low-fat (1%) or fat-free (skim) milk. Fat-free, low-fat, or reduced-fat cheeses. Nonfat, low-sodium ricotta or cottage cheese. Low-fat or nonfat yogurt. Low-fat, low-sodium cheese. Fats and oils Soft margarine without trans fats. Vegetable oil. Low-fat, reduced-fat, or light mayonnaise and salad dressings (reduced-sodium). Canola, safflower, olive, soybean, and sunflower oils. Avocado. Seasoning and other foods Herbs. Spices. Seasoning mixes without salt. Unsalted popcorn and pretzels. Fat-free sweets. What foods are not recommended? The items listed may not be a complete list. Talk with your dietitian about what dietary choices are best for you. Grains Baked goods made with fat, such as croissants, muffins, or some breads. Dry pasta or rice meal packs. Vegetables Creamed or fried vegetables. Vegetables in a cheese sauce. Regular canned vegetables (not low-sodium or reduced-sodium). Regular canned tomato sauce and paste (not low-sodium or reduced-sodium). Regular tomato and vegetable juice (not low-sodium or reduced-sodium). Angie Fava. Olives. Fruits Canned fruit in a light or heavy syrup. Fried fruit. Fruit in cream or butter sauce. Meat and other protein foods Fatty cuts of meat. Ribs. Fried meat. Berniece Salines. Sausage. Bologna and other processed lunch meats. Salami. Fatback. Hotdogs. Bratwurst. Salted nuts and seeds. Canned beans with added salt. Canned or smoked fish. Whole eggs or egg yolks. Chicken or [place] with skin. Dairy Whole or 2% milk, cream, and half-and-half. Whole or full-fat  cream cheese. Whole-fat or sweetened yogurt. Full-fat cheese. Nondairy creamers. Whipped toppings. Processed cheese and cheese spreads. Fats and oils Butter. Stick margarine. Lard. Shortening. Ghee. Bacon fat. Tropical oils, such as coconut, palm kernel, or palm oil. Seasoning and other foods Salted popcorn and pretzels. Onion salt, garlic salt, seasoned salt, table salt, and sea salt. Worcestershire sauce. Tartar sauce. Barbecue sauce. Teriyaki sauce. Soy sauce, including reduced-sodium. Steak sauce. Canned and packaged gravies. Fish sauce. Oyster sauce. Cocktail sauce. Horseradish that you find on the shelf. Ketchup. Mustard. Meat flavorings and tenderizers. Bouillon cubes. Hot sauce and Tabasco sauce. Premade or packaged marinades. Premade or packaged taco seasonings. Relishes. Regular salad dressings. Where to find more information:  National Heart, Lung, and [place]: https://wilson-eaton.com/  American Heart Association: www.heart.org Summary  The DASH eating plan is a healthy eating plan that has been shown to reduce high blood pressure (hypertension). It may also reduce your risk for type 2 diabetes, heart disease, and stroke.  With the DASH eating plan, you should limit salt (sodium) intake to 2,300 mg a day. If you have hypertension, you may need to reduce your sodium intake to 1,500 mg a day.  When on the DASH eating plan, aim to eat more fresh fruits and vegetables, whole grains, lean proteins, low-fat dairy, and heart-healthy  fats.  Work with your health care provider or diet and nutrition specialist (dietitian) to adjust your eating plan to your individual calorie needs. This information is not intended to replace advice given to you by your health care provider. Make sure you discuss any questions you have with your health care provider. Document Released: 03/26/2011 Document Revised: 03/19/2017 Document Reviewed: 03/30/2016 Elsevier Patient Education  2020 [employer].

## 2018-10-27 LAB — TSH: TSH: 1.97 mIU/L (ref 0.40–4.50)

## 2018-12-01 ENCOUNTER — Encounter: Payer: Self-pay | Admitting: Family Medicine

## 2018-12-01 ENCOUNTER — Other Ambulatory Visit: Payer: Self-pay

## 2018-12-01 ENCOUNTER — Ambulatory Visit (INDEPENDENT_AMBULATORY_CARE_PROVIDER_SITE_OTHER): Payer: Managed Care, Other (non HMO) | Admitting: Family Medicine

## 2018-12-01 VITALS — BP 140/80

## 2018-12-01 DIAGNOSIS — I10 Essential (primary) hypertension: Secondary | ICD-10-CM

## 2018-12-01 DIAGNOSIS — F5101 Primary insomnia: Secondary | ICD-10-CM | POA: Diagnosis not present

## 2018-12-01 DIAGNOSIS — R000 Tachycardia, unspecified: Secondary | ICD-10-CM | POA: Diagnosis not present

## 2018-12-01 MED ORDER — OLMESARTAN MEDOXOMIL-HCTZ 20-12.5 MG PO TABS: 1.0000 | ORAL_TABLET | Freq: Every day | ORAL | 0 refills | Status: DC

## 2018-12-01 MED ORDER — NEBIVOLOL HCL 20 MG PO TABS: 20.0000 mg | ORAL_TABLET | Freq: Every day | ORAL | 0 refills | Status: DC

## 2018-12-01 NOTE — Progress Notes (Signed)
Name: Charles Valentine   MRN: [id number]    DOB: April 06, 1956   Date:12/01/2018       Progress Note  Subjective  Chief Complaint  Chief Complaint  Patient presents with  . Follow-up    I connected with  Charles Valentine  on 12/01/18 at 10:40 AM EDT by a video enabled telemedicine application and verified that I am speaking with the correct person using two identifiers.  I discussed the limitations of evaluation and management by telemedicine and the availability of in person appointments. The patient expressed understanding and agreed to proceed. Staff also discussed with the patient that there may be a patient responsible charge related to this service. Patient Location: at home  Provider Location: [facility]   HPI  HTN: diagnosedin 2018, he wasNorvasc 10 mg also tried Exforge but switched  to Bystolic because of tachycardia, Seen by cardiologist, Dr. Saunders Revel . He had a work screen and bp was very high in July and we added HCTZ, he is also on higher dose of Bystolic, he states bp is still in the 150's we will change from hctz to valsartan hctz and keep monitoring, goal is to keep his bp below 135/80. He denies headaches, chest pain or palpitation   Hyperlipidemia he is on statin therapy now and tolerating it well, also on aspirin, last LDL was down to 84   ED: he is taking Cialis occasionally, able to have an erection without medication but not as hard as it used to.  Insomnia: he is doing better since he retired, only taking it prn.    Patient Active Problem List   Diagnosis Date Noted  . Sinus tachycardia 09/02/2016  . Essential hypertension 09/02/2016  . Eczema 06/08/2016  . Hyperlipidemia LDL goal <130 04/08/2016  . ED (erectile dysfunction)     No past surgical history on file.  Family History  Problem Relation Age of Onset  . Hypertension Mother   . Diabetes Mother   . Heart disease Mother 3       CABG  . Heart attack Mother 44  . Obesity  Sister   . Drug abuse Brother     Social History   Socioeconomic History  . Marital status: Married    Spouse name: Charles Valentine  . Number of children: 2  . Years of education: Not on file  . Highest education level: 12th grade  Occupational History  . Occupation: Glass blower/designer    Comment: Banker  Social Needs  . Financial resource strain: Not hard at all  . Food insecurity    Worry: Never true    Inability: Never true  . Transportation needs    Medical: No    Non-medical: No  Tobacco Use  . Smoking status: Former Smoker    Packs/day: 0.50    Years: 10.00    Pack years: 5.00    Types: Cigarettes    Quit date: 1989    Years since quitting: 31.6  . Smokeless tobacco: Never Used  Substance and Sexual Activity  . Alcohol use: Yes    Alcohol/week: 8.0 standard drinks    Types: 6 Cans of beer, 2 Shots of liquor per week  . Drug use: No  . Sexual activity: Yes    Partners: Female    Birth control/protection: None  Lifestyle  . Physical activity    Days per week: 3 days    Minutes per session: 30 min  . Stress: Very much  Relationships  .  Social Herbalist on phone: Never    Gets together: More than three times a week    Attends religious service: 1 to 4 times per year    Active member of club or organization: No    Attends meetings of clubs or organizations: Never    Relationship status: Married  . Intimate partner violence    Fear of current or ex partner: No    Emotionally abused: No    Physically abused: No    Forced sexual activity: No  Other Topics Concern  . 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  aspirin EC 81 MG tablet, Take 1 tablet (81 mg total) by mouth daily., Disp: 30 tablet,  Rfl: 0 .  atorvastatin (LIPITOR) 40 MG tablet, Take 1 tablet (40 mg total) by mouth daily., Disp: 90 tablet, Rfl: 1 .  hydrochlorothiazide (HYDRODIURIL) 12.5 MG tablet, Take 1 tablet (12.5 mg total) by mouth daily., Disp: 30 tablet, Rfl: 0 .  tadalafil (ADCIRCA/CIALIS) 20 MG tablet, Take 1 tablet (20 mg total) by mouth daily as needed for erectile dysfunction., Disp: 30 tablet, Rfl: 1 .  traZODone (DESYREL) 50 MG tablet, Take 0.5-1 tablets (25-50 mg total) by mouth at bedtime as needed for sleep., Disp: 90 tablet, Rfl: 0 .  Multiple Vitamins-Minerals (MULTIVITAMIN) tablet, Take 1 tablet by mouth daily. (Patient not taking: Reported on 12/01/2018), Disp: 30 tablet, Rfl: 0 .  Nebivolol HCl 20 MG TABS, Take 1 tablet (20 mg total) by mouth daily. In place of Exforge, Disp: 90 tablet, Rfl: 0  No Known Allergies  I personally reviewed active problem list, medication list, allergies, family history, social history with the patient/caregiver today.   ROS  Constitutional: Negative for fever or weight change.  Respiratory: Negative for cough and shortness of breath.   Cardiovascular: Negative for chest pain or palpitations.  Gastrointestinal: Negative for abdominal pain, no bowel changes.  Musculoskeletal: Negative for gait problem or joint swelling.  Skin: Negative for rash.  Neurological: Negative for dizziness or headache.  No other specific complaints in a complete review of systems (except as listed in HPI above).  Objective  Virtual encounter,   Today's Vitals   12/01/18 1150  BP: 140/80   There is no height or weight on file to calculate BMI.  Physical Exam  Awake, alert and oriented   PHQ2/9: Depression screen [facility] 2/9 12/01/2018 10/26/2018 06/13/2018 12/10/2017 06/11/2017  Decreased Interest 0 0 0 0 0  Down, Depressed, Hopeless 0 0 0 0 0  PHQ - 2 Score 0 0 0 0 0  Altered sleeping 2 2 0 3 -  Tired, decreased energy 0 0 0 1 -  Change in appetite 0 0 0 0 -  Feeling bad or failure  about yourself  0 0 0 0 -  Trouble concentrating 0 0 0 0 -  Moving slowly or fidgety/restless 0 0 0 0 -  Suicidal thoughts 0 0 0 0 -  PHQ-9 Score 2 2 0 4 -  Difficult doing work/chores Not difficult at all Not difficult at all Not difficult at all Somewhat difficult -   PHQ-2/9 Result is negative.    Fall Risk: Fall Risk  12/01/2018 10/26/2018 06/13/2018 12/10/2017 06/11/2017  Falls in the past year? 0 0 0 No No  Number falls in past yr: 0 0 0 - -  Injury with Fall? 0 0 0 - -  Follow up - - Falls evaluation completed - -    Assessment & Plan  1. Sinus tachycardia  - Nebivolol HCl 20 MG TABS; Take 1 tablet (20 mg total) by mouth daily. In place of Exforge  Dispense: 90 tablet; Refill: 0  2. Hypertension, benign  - olmesartan-hydrochlorothiazide (BENICAR HCT) 20-12.5 MG tablet; Take 1 tablet by mouth daily.  Dispense: 90 tablet; Refill: 0 - Nebivolol HCl 20 MG TABS; Take 1 tablet (20 mg total) by mouth daily. In place of Exforge  Dispense: 90 tablet; Refill: 0  3. Primary insomnia  Taking medication prn   I discussed the assessment and treatment plan with the patient. The patient was provided an opportunity to ask questions and all were answered. The patient agreed with the plan and demonstrated an understanding of the instructions.  The patient was advised to call back or seek an in-person evaluation if the symptoms worsen or if the condition fails to improve as anticipated.  I provided 25  minutes of non-face-to-face time during this encounter.

## 2018-12-02 ENCOUNTER — Telehealth: Payer: Self-pay

## 2018-12-02 NOTE — Telephone Encounter (Signed)
Copied from [place] [id number]. Topic: General - Other >> Dec 02, 2018  1:40 PM Rainey Pines A wrote: Patient would like a callback from Dr .Ancil Boozer nurse in regards to his olmesartan-hydrochlorothiazide (BENICAR HCT) 20-12.5 MG tablet   I called patient about his concerns about his bp medication. He seems to think that he should not be taking 2 bp medication. He thinks that you jumped to fast by putting him on 2 different medication. He states that he will try taking both for 30 days, to see if it works. He is not the type of person that likes to take pills. He said he is only going to take one of those pills witch will probably be the Bystolic over the weekend.

## 2019-01-03 ENCOUNTER — Telehealth: Payer: Self-pay | Admitting: Internal Medicine

## 2019-01-03 NOTE — Telephone Encounter (Signed)
Patient declined recall appt. Will fu with pcp   Per request deleting recall.

## 2019-01-31 ENCOUNTER — Other Ambulatory Visit: Payer: Self-pay

## 2019-01-31 ENCOUNTER — Ambulatory Visit (INDEPENDENT_AMBULATORY_CARE_PROVIDER_SITE_OTHER): Payer: Managed Care, Other (non HMO)

## 2019-01-31 DIAGNOSIS — Z23 Encounter for immunization: Secondary | ICD-10-CM

## 2019-03-07 ENCOUNTER — Ambulatory Visit (INDEPENDENT_AMBULATORY_CARE_PROVIDER_SITE_OTHER): Payer: Managed Care, Other (non HMO) | Admitting: Family Medicine

## 2019-03-07 ENCOUNTER — Encounter: Payer: Self-pay | Admitting: Family Medicine

## 2019-03-07 ENCOUNTER — Other Ambulatory Visit: Payer: Self-pay

## 2019-03-07 ENCOUNTER — Ambulatory Visit: Payer: Managed Care, Other (non HMO) | Admitting: Family Medicine

## 2019-03-07 DIAGNOSIS — R000 Tachycardia, unspecified: Secondary | ICD-10-CM

## 2019-03-07 DIAGNOSIS — I10 Essential (primary) hypertension: Secondary | ICD-10-CM

## 2019-03-07 DIAGNOSIS — N528 Other male erectile dysfunction: Secondary | ICD-10-CM

## 2019-03-07 DIAGNOSIS — E785 Hyperlipidemia, unspecified: Secondary | ICD-10-CM | POA: Diagnosis not present

## 2019-03-07 DIAGNOSIS — F5101 Primary insomnia: Secondary | ICD-10-CM

## 2019-03-07 MED ORDER — TRAZODONE HCL 50 MG PO TABS
25.0000 mg | ORAL_TABLET | Freq: Every evening | ORAL | 1 refills | Status: DC | PRN
Start: 2019-03-07 — End: 2019-07-25

## 2019-03-07 MED ORDER — TADALAFIL 20 MG PO TABS: 20.0000 mg | ORAL_TABLET | Freq: Every day | ORAL | 1 refills | Status: DC | PRN

## 2019-03-07 MED ORDER — ATORVASTATIN CALCIUM 40 MG PO TABS: 40.0000 mg | ORAL_TABLET | Freq: Every day | ORAL | 1 refills | Status: DC

## 2019-03-07 MED ORDER — OLMESARTAN MEDOXOMIL-HCTZ 20-12.5 MG PO TABS
1.0000 | ORAL_TABLET | Freq: Every day | ORAL | 1 refills | Status: DC
Start: 2019-03-07 — End: 2019-07-25

## 2019-03-07 MED ORDER — NEBIVOLOL HCL 20 MG PO TABS: 20.0000 mg | ORAL_TABLET | Freq: Every day | ORAL | 1 refills | Status: DC

## 2019-03-07 NOTE — Progress Notes (Signed)
Name: Charles Valentine   MRN: [id number]    DOB: [birth year]   Date:03/07/2019       Progress Note  Subjective  Chief Complaint  Chief Complaint  Patient presents with  . Hypertension  . Insomnia    HPI  HTN: diagnosedin 2018, he wasNorvasc 10 mg also tried Exforge but switched  to Bystolic because of tachycardia, Seen by cardiologist, Dr. Saunders Revel. He had a work screen and bp was very high in July and we added HCTZ, he is also on higher dose of Bystolic, he states bp is still in the 150's he is now on Benicar hctz and bystolic, today at home bp at goal even though he ran out of benicar hctz a few days ago, but still taking Bystolic He denies palpitation, SOB, dizziness or chest pain   Echo 08/2016 :  - Left ventricle: The cavity size was normal. Wall thickness was   normal. Systolic function was normal. The estimated ejection   fraction was in the range of 60% to 65%. Wall motion was normal;   there were no regional wall motion abnormalities. Left   ventricular diastolic function parameters were normal. Hyperlipidemia he is on statin therapy now and tolerating it well, also on aspirin, last LDL was down to 84   ED: he is taking Cialis occasionally, able to have an erection without medication but not as hard as it used to. He states expensive but we sent to [facility] and he will try to use [facility] voucher  Insomnia: he is doing better since he retired, only taking it prn. Wife snores and keeps him awake even with white noise machine Advised to take Trazodone every night.  Patient Active Problem List   Diagnosis Date Noted  . Sinus tachycardia 09/02/2016  . Essential hypertension 09/02/2016  . Eczema 06/08/2016  . Hyperlipidemia LDL goal <130 04/08/2016  . ED (erectile dysfunction)     History reviewed. No pertinent surgical history.  Family History  Problem Relation Age of Onset  . Hypertension Mother   . Diabetes Mother   . Heart disease Mother 23       CABG  .  Heart attack Mother 104  . Obesity Sister   . Drug abuse Brother     Social History   Socioeconomic History  . Marital status: Married    Spouse name: [name]  . Number of children: 2  . Years of education: Not on file  . Highest education level: 12th grade  Occupational History  . Occupation: Glass blower/designer    Comment: Banker  Social Needs  . Financial resource strain: Not hard at all  . Food insecurity    Worry: Never true    Inability: Never true  . Transportation needs    Medical: No    Non-medical: No  Tobacco Use  . Smoking status: Former Smoker    Packs/day: 0.50    Years: 10.00    Pack years: 5.00    Types: Cigarettes    Quit date: 1989    Years since quitting: 31.8  . Smokeless tobacco: Never Used  Substance and Sexual Activity  . Alcohol use: Yes    Alcohol/week: 8.0 standard drinks    Types: 6 Cans of beer, 2 Shots of liquor per week  . Drug use: No  . Sexual activity: Yes    Partners: Female    Birth control/protection: None  Lifestyle  . Physical activity    Days per week: 3 days  Minutes per session: 30 min  . Stress: Very much  Relationships  . Social Herbalist on phone: Never    Gets together: More than three times a week    Attends religious service: 1 to 4 times per year    Active member of club or organization: No    Attends meetings of clubs or organizations: Never    Relationship status: Married  . Intimate partner violence    Fear of current or ex partner: No    Emotionally abused: No    Physically abused: No    Forced sexual activity: No  Other Topics Concern  . Not on file  Social History Narrative   Married    He has two grown son's and 3 grandchildren   Retired October 2020      Current Outpatient Medications:  .  aspirin EC 81 MG tablet, Take 1 tablet (81 mg total) by mouth daily., Disp: 30 tablet, Rfl: 0 .  atorvastatin (LIPITOR) 40 MG tablet, Take 1 tablet (40 mg total) by mouth daily.,  Disp: 90 tablet, Rfl: 1 .  Multiple Vitamins-Minerals (MULTIVITAMIN) tablet, Take 1 tablet by mouth daily., Disp: 30 tablet, Rfl: 0 .  Nebivolol HCl 20 MG TABS, Take 1 tablet (20 mg total) by mouth daily. In place of Exforge, Disp: 90 tablet, Rfl: 1 .  olmesartan-hydrochlorothiazide (BENICAR HCT) 20-12.5 MG tablet, Take 1 tablet by mouth daily., Disp: 90 tablet, Rfl: 1 .  tadalafil (CIALIS) 20 MG tablet, Take 1 tablet (20 mg total) by mouth daily as needed for erectile dysfunction., Disp: 30 tablet, Rfl: 1 .  traZODone (DESYREL) 50 MG tablet, Take 0.5-1 tablets (25-50 mg total) by mouth at bedtime as needed for sleep., Disp: 90 tablet, Rfl: 1  No Known Allergies  I personally reviewed active problem list, medication list, allergies, family history, social history, health maintenance with the patient/caregiver today.   ROS  Ten systems reviewed and is negative except as mentioned in HPI   Objective  Today's Vitals   03/07/19 0913  BP: 134/82  Weight: 188 lb (85.3 kg)   Body mass index is 24.8 kg/m.  Body mass index is 24.8 kg/m.  Physical Exam  Awake, alert and oriented    PHQ2/9: Depression screen [facility] 2/9 03/07/2019 12/01/2018 10/26/2018 06/13/2018 12/10/2017  Decreased Interest 0 0 0 0 0  Down, Depressed, Hopeless 0 0 0 0 0  PHQ - 2 Score 0 0 0 0 0  Altered sleeping 0 2 2 0 3  Tired, decreased energy 0 0 0 0 1  Change in appetite 0 0 0 0 0  Feeling bad or failure about yourself  0 0 0 0 0  Trouble concentrating 0 0 0 0 0  Moving slowly or fidgety/restless 0 0 0 0 0  Suicidal thoughts 0 0 0 0 0  PHQ-9 Score 0 2 2 0 4  Difficult doing work/chores - Not difficult at all Not difficult at all Not difficult at all Somewhat difficult    phq 9 is negative   Fall Risk: Fall Risk  03/07/2019 12/01/2018 10/26/2018 06/13/2018 12/10/2017  Falls in the past year? 0 0 0 0 No  Number falls in past yr: 0 0 0 0 -  Injury with Fall? 0 0 0 0 -  Follow up - - - Falls evaluation completed -     Functional Status Survey: Is the patient deaf or have difficulty hearing?: No Does the patient have difficulty seeing, even when  wearing glasses/contacts?: No Does the patient have difficulty concentrating, remembering, or making decisions?: No Does the patient have difficulty walking or climbing stairs?: No Does the patient have difficulty dressing or bathing?: No Does the patient have difficulty doing errands alone such as visiting a doctor's office or shopping?: No    Assessment & Plan   1. Hyperlipidemia LDL goal <130  - atorvastatin (LIPITOR) 40 MG tablet; Take 1 tablet (40 mg total) by mouth daily.  Dispense: 90 tablet; Refill: 1  2. Sinus tachycardia  - Nebivolol HCl 20 MG TABS; Take 1 tablet (20 mg total) by mouth daily. In place of Exforge  Dispense: 90 tablet; Refill: 1  3. Hypertension, benign  - Nebivolol HCl 20 MG TABS; Take 1 tablet (20 mg total) by mouth daily. In place of Exforge  Dispense: 90 tablet; Refill: 1 - olmesartan-hydrochlorothiazide (BENICAR HCT) 20-12.5 MG tablet; Take 1 tablet by mouth daily.  Dispense: 90 tablet; Refill: 1  4. Other male erectile dysfunction  - tadalafil (CIALIS) 20 MG tablet; Take 1 tablet (20 mg total) by mouth daily as needed for erectile dysfunction.  Dispense: 30 tablet; Refill: 1  5. Primary insomnia  - traZODone (DESYREL) 50 MG tablet; Take 0.5-1 tablets (25-50 mg total) by mouth at bedtime as needed for sleep.  Dispense: 90 tablet; Refill: 1  Reminded him to have cologuard done

## 2019-05-16 ENCOUNTER — Telehealth: Payer: Self-pay | Admitting: Internal Medicine

## 2019-05-16 NOTE — Telephone Encounter (Signed)
Patient declined deleting recall.

## 2019-06-16 ENCOUNTER — Encounter: Payer: Managed Care, Other (non HMO) | Admitting: Family Medicine

## 2019-07-24 ENCOUNTER — Ambulatory Visit: Payer: Self-pay | Attending: Internal Medicine

## 2019-07-24 ENCOUNTER — Other Ambulatory Visit: Payer: Self-pay

## 2019-07-24 DIAGNOSIS — Z23 Encounter for immunization: Secondary | ICD-10-CM

## 2019-07-24 NOTE — Progress Notes (Signed)
   Covid-19 Vaccination Clinic  Name:  Charles Valentine    MRN: [id number] DOB: 1955-07-20  07/24/2019  [name] was observed post Covid-19 immunization for 15 minutes without incident. He was provided with Vaccine Information Sheet and instruction to access the V-Safe system.   [name] was instructed to call 911 with any severe reactions post vaccine: Marland Kitchen Difficulty breathing  . Swelling of face and throat  . A fast heartbeat  . A bad rash all over body  . Dizziness and weakness   Immunizations Administered    Name Date Dose VIS Date Route   Pfizer COVID-19 Vaccine 07/24/2019  9:23 AM 0.3 mL 03/31/2019 Intramuscular   Manufacturer: [place]   Lot: [id number]   [place]: [id number]

## 2019-07-25 ENCOUNTER — Ambulatory Visit (INDEPENDENT_AMBULATORY_CARE_PROVIDER_SITE_OTHER): Payer: Managed Care, Other (non HMO) | Admitting: Family Medicine

## 2019-07-25 ENCOUNTER — Other Ambulatory Visit: Payer: Self-pay

## 2019-07-25 ENCOUNTER — Encounter: Payer: Self-pay | Admitting: Family Medicine

## 2019-07-25 VITALS — BP 150/80 | HR 94 | Temp 96.9°F | Resp 16 | Ht 72.0 in | Wt 186.7 lb

## 2019-07-25 DIAGNOSIS — R000 Tachycardia, unspecified: Secondary | ICD-10-CM

## 2019-07-25 DIAGNOSIS — Z0000 Encounter for general adult medical examination without abnormal findings: Secondary | ICD-10-CM | POA: Diagnosis not present

## 2019-07-25 DIAGNOSIS — I10 Essential (primary) hypertension: Secondary | ICD-10-CM

## 2019-07-25 DIAGNOSIS — F5101 Primary insomnia: Secondary | ICD-10-CM | POA: Diagnosis not present

## 2019-07-25 DIAGNOSIS — N528 Other male erectile dysfunction: Secondary | ICD-10-CM | POA: Diagnosis not present

## 2019-07-25 DIAGNOSIS — Z131 Encounter for screening for diabetes mellitus: Secondary | ICD-10-CM

## 2019-07-25 DIAGNOSIS — Z1211 Encounter for screening for malignant neoplasm of colon: Secondary | ICD-10-CM | POA: Diagnosis not present

## 2019-07-25 DIAGNOSIS — E785 Hyperlipidemia, unspecified: Secondary | ICD-10-CM

## 2019-07-25 MED ORDER — ATORVASTATIN CALCIUM 40 MG PO TABS: 40.0000 mg | ORAL_TABLET | Freq: Every day | ORAL | 1 refills | Status: DC

## 2019-07-25 MED ORDER — OLMESARTAN MEDOXOMIL-HCTZ 20-12.5 MG PO TABS: 1.0000 | ORAL_TABLET | Freq: Every day | ORAL | 1 refills | Status: DC

## 2019-07-25 MED ORDER — TADALAFIL 20 MG PO TABS: 20.0000 mg | ORAL_TABLET | Freq: Every day | ORAL | 1 refills | Status: DC | PRN

## 2019-07-25 MED ORDER — NEBIVOLOL HCL 20 MG PO TABS: 20.0000 mg | ORAL_TABLET | Freq: Every day | ORAL | 1 refills | Status: DC

## 2019-07-25 MED ORDER — TRAZODONE HCL 100 MG PO TABS: 100.0000 mg | ORAL_TABLET | Freq: Every evening | ORAL | 1 refills | Status: DC | PRN

## 2019-07-25 NOTE — Patient Instructions (Signed)
Preventive Care 5-62 Years Old, Male Preventive care refers to lifestyle choices and visits with your health care provider that can promote health and wellness. This includes:  A yearly physical exam. This is also called an annual well check.  Regular dental and eye exams.  Immunizations.  Screening for certain conditions.  Healthy lifestyle choices, such as eating a healthy diet, getting regular exercise, not using drugs or products that contain nicotine and tobacco, and limiting alcohol use. What can I expect for my preventive care visit? Physical exam Your health care provider will check:  Height and weight. These may be used to calculate body mass index (BMI), which is a measurement that tells if you are at a healthy weight.  Heart rate and blood pressure.  Your skin for abnormal spots. Counseling Your health care provider may ask you questions about:  Alcohol, tobacco, and drug use.  Emotional well-being.  Home and relationship well-being.  Sexual activity.  Eating habits.  Work and work Statistician. What immunizations do I need?  Influenza (flu) vaccine  This is recommended every year. Tetanus, diphtheria, and pertussis (Tdap) vaccine  You may need a Td booster every 10 years. Varicella (chickenpox) vaccine  You may need this vaccine if you have not already been vaccinated. Zoster (shingles) vaccine  You may need this after age 29. Measles, mumps, and rubella (MMR) vaccine  You may need at least one dose of MMR if you were born in 1957 or later. You may also need a second dose. Pneumococcal conjugate (PCV13) vaccine  You may need this if you have certain conditions and were not previously vaccinated. Pneumococcal polysaccharide (PPSV23) vaccine  You may need one or two doses if you smoke cigarettes or if you have certain conditions. Meningococcal conjugate (MenACWY) vaccine  You may need this if you have certain conditions. Hepatitis A  vaccine  You may need this if you have certain conditions or if you travel or work in places where you may be exposed to hepatitis A. Hepatitis B vaccine  You may need this if you have certain conditions or if you travel or work in places where you may be exposed to hepatitis B. Haemophilus influenzae type b (Hib) vaccine  You may need this if you have certain risk factors. Human papillomavirus (HPV) vaccine  If recommended by your health care provider, you may need three doses over 6 months. You may receive vaccines as individual doses or as more than one vaccine together in one shot (combination vaccines). Talk with your health care provider about the risks and benefits of combination vaccines. What tests do I need? Blood tests  Lipid and cholesterol levels. These may be checked every 5 years, or more frequently if you are over 30 years old.  Hepatitis C test.  Hepatitis B test. Screening  Lung cancer screening. You may have this screening every year starting at age 67 if you have a 30-pack-year history of smoking and currently smoke or have quit within the past 15 years.  Prostate cancer screening. Recommendations will vary depending on your family history and other risks.  Colorectal cancer screening. All adults should have this screening starting at age 57 and continuing until age 53. Your health care provider may recommend screening at age 12 if you are at increased risk. You will have tests every 1-10 years, depending on your results and the type of screening test.  Diabetes screening. This is done by checking your blood sugar (glucose) after you have not eaten  for a while (fasting). You may have this done every 1-3 years.  Sexually transmitted disease (STD) testing. Follow these instructions at home: Eating and drinking  Eat a diet that includes fresh fruits and vegetables, whole grains, lean protein, and low-fat dairy products.  Take vitamin and mineral supplements as  recommended by your health care provider.  Do not drink alcohol if your health care provider tells you not to drink.  If you drink alcohol: ? Limit how much you have to 0-2 drinks a day. ? Be aware of how much alcohol is in your drink. In the [place] one drink equals one 12 oz bottle of beer (355 mL), one 5 oz glass of wine (148 mL), or one 1 oz glass of hard liquor (44 mL). Lifestyle  Take daily care of your teeth and gums.  Stay active. Exercise for at least 30 minutes on 5 or more days each week.  Do not use any products that contain nicotine or tobacco, such as cigarettes, e-cigarettes, and chewing tobacco. If you need help quitting, ask your health care provider.  If you are sexually active, practice safe sex. Use a condom or other form of protection to prevent STIs (sexually transmitted infections).  Talk with your health care provider about taking a low-dose aspirin every day starting at age 53. What's next?  Go to your health care provider once a year for a well check visit.  Ask your health care provider how often you should have your eyes and teeth checked.  Stay up to date on all vaccines. This information is not intended to replace advice given to you by your health care provider. Make sure you discuss any questions you have with your health care provider. Document Revised: 03/31/2018 Document Reviewed: 03/31/2018 Elsevier Patient Education  2020 [employer].

## 2019-07-25 NOTE — Progress Notes (Signed)
Name: Charles Valentine   MRN: [id number]    DOB: Dec 15, 1955   Date:07/25/2019       Progress Note  Subjective  Chief Complaint  Chief Complaint  Patient presents with  . Annual Exam  . Follow-up    HTN, HL, ED    HPI  Patient presents for annual CPE and follow up  HTN: diagnosedin 2018, he wasNorvasc 10 mg also tried Exforge but switchedto Bystolic because of tachycardia. Seen by cardiologist, Dr. Saunders Revel. BP today is high, he told me he has been taking Bystolic , he has not been taking Benicar hctz because he was feeling good. Explained that bp is a silent killer, and he needs to continue both medications  Echo 08/2016 :  - Left ventricle: The cavity size was normal. Wall thickness was normal. Systolic function was normal. The estimated ejection fraction was in the range of 60% to 65%. Wall motion was normal; there were no regional wall motion abnormalities. Left ventricular diastolic function parameters were normal.  Hyperlipidemia: he is on statin therapy now and tolerating it well, also on aspirin, last LDL was down to 84. We will recheck labs today   ED: he is taking Cialis occasionally,doing well on medication, no side effects   Insomnia:he states retired last year, and was sleeping okay, but recently Trazodone not working, wakes up around 3 am and mind is busy. We will increase dose of Trazodone to 100 mg. Discussed love and kindness meditation    USPSTF grade A and B recommendations:  Diet: eats at home, balanced diet, avoids caffeine, very seldom has sodas.  Exercise: active at home   Depression: phq 9 is negative Depression screen [facility] 2/9 07/25/2019 03/07/2019 12/01/2018 10/26/2018 06/13/2018  Decreased Interest 0 0 0 0 0  Down, Depressed, Hopeless 0 0 0 0 0  PHQ - 2 Score 0 0 0 0 0  Altered sleeping 0 0 2 2 0  Tired, decreased energy 0 0 0 0 0  Change in appetite 0 0 0 0 0  Feeling bad or failure about yourself  0 0 0 0 0  Trouble concentrating 0 0 0  0 0  Moving slowly or fidgety/restless 0 0 0 0 0  Suicidal thoughts 0 0 0 0 0  PHQ-9 Score 0 0 2 2 0  Difficult doing work/chores - - Not difficult at all Not difficult at all Not difficult at all    Hypertension:  BP Readings from Last 3 Encounters:  07/25/19 (!) 150/80  03/07/19 134/82  12/01/18 140/80    Obesity: Wt Readings from Last 3 Encounters:  07/25/19 186 lb 11.2 oz (84.7 kg)  03/07/19 188 lb (85.3 kg)  10/26/18 182 lb 14.4 oz (83 kg)   BMI Readings from Last 3 Encounters:  07/25/19 25.32 kg/m  03/07/19 24.80 kg/m  10/26/18 24.13 kg/m     Lipids:  Lab Results  Component Value Date   CHOL 151 10/14/2018   CHOL 223 (H) 06/13/2018   CHOL 195 06/11/2017   Lab Results  Component Value Date   HDL 55 10/14/2018   HDL 58 06/13/2018   HDL 57 06/11/2017   Lab Results  Component Value Date   LDLCALC 84 10/14/2018   LDLCALC 147 (H) 06/13/2018   LDLCALC 121 (H) 06/11/2017   Lab Results  Component Value Date   TRIG 60 10/14/2018   TRIG 75 06/13/2018   TRIG 71 06/11/2017   Lab Results  Component Value Date   CHOLHDL 3.8 06/13/2018  CHOLHDL 3.4 06/11/2017   CHOLHDL 3.9 06/08/2016   No results found for: LDLDIRECT Glucose:  Glucose, Bld  Date Value Ref Range Status  06/13/2018 88 65 - 99 mg/dL Final    Comment:    .            Fasting reference interval .   06/11/2017 94 65 - 99 mg/dL Final    Comment:    .            Fasting reference interval .   12/09/2016 104 (H) 65 - 99 mg/dL Final      [facility] Visit from 12/01/2018 in [facility]  AUDIT-C Score  3       Married STD testing and prevention (HIV/chl/gon/syphilis): N/A Hep C: up to date   Skin cancer: Discussed monitoring for atypical lesions Colorectal cancer: he states wife is retiring and will have new insurance, he will call me back when he decides to schedule it.  Prostate cancer: discussed USPTF Lab Results  Component Value Date   PSA 0.7 06/08/2016     IPSS Questionnaire (AUA-7): Over the past month.   1)  How often have you had a sensation of not emptying your bladder completely after you finish urinating?  0 - Not at all  2)  How often have you had to urinate again less than two hours after you finished urinating? 0 - Not at all  3)  How often have you found you stopped and started again several times when you urinated?  0 - Not at all  4) How difficult have you found it to postpone urination?  0 - Not at all  5) How often have you had a weak urinary stream?  0 - Not at all  6) How often have you had to push or strain to begin urination?  0 - Not at all  7) How many times did you most typically get up to urinate from the time you went to bed until the time you got up in the morning?  2 - 2 times  Total score:  0-7 mildly symptomatic   8-19 moderately symptomatic   20-35 severely symptomatic    Lung cancer:  Low Dose CT Chest recommended if Age 16-80 years, 30 pack-year currently smoking OR have quit w/in 15years. Patient does not qualify.   AAA: The USPSTF recommends one-time screening with ultrasonography in men ages 14 to 70 years who have ever smoked ECG:  2018  Advanced Care Planning: A voluntary discussion about advance care planning including the explanation and discussion of advance directives.  Discussed health care proxy and Living will, and the patient was able to identify a health care proxy as wife .  Patient does have a living will at present time. If patient does have living will, I have requested they bring this to the clinic to be scanned in to their chart.  Patient Active Problem List   Diagnosis Date Noted  . Sinus tachycardia 09/02/2016  . Essential hypertension 09/02/2016  . Eczema 06/08/2016  . Hyperlipidemia LDL goal <130 04/08/2016  . ED (erectile dysfunction)     History reviewed. No pertinent surgical history.  Family History  Problem Relation Age of Onset  . Hypertension Mother   . Diabetes Mother    . Heart disease Mother 53       CABG  . Heart attack Mother 62  . Obesity Sister   . Drug abuse Brother     Social History  Socioeconomic History  . Marital status: Married    Spouse name: [name]  . Number of children: 2  . Years of education: Not on file  . Highest education level: 12th grade  Occupational History  . Occupation: Glass blower/designer    Comment: [employer]  Tobacco Use  . Smoking status: Former Smoker    Packs/day: 0.50    Years: 10.00    Pack years: 5.00    Types: Cigarettes    Quit date: 1989    Years since quitting: 32.2  . Smokeless tobacco: Never Used  Substance and Sexual Activity  . Alcohol use: Yes    Alcohol/week: 8.0 standard drinks    Types: 6 Cans of beer, 2 Shots of liquor per week  . Drug use: No  . Sexual activity: Yes    Partners: Female    Birth control/protection: None  Other Topics Concern  . Not on file  Social History Narrative   Married    He has two grown son's and 3 grandchildren   Retired October 2020    Social Determinants of Health   Financial Resource Strain: [place]   . Difficulty of Paying Living Expenses: Not hard at all  Food Insecurity: No Food Insecurity  . Worried About Charity fundraiser in the Last Year: Never true  . Ran Out of Food in the Last Year: Never true  Transportation Needs: No Transportation Needs  . Lack of Transportation (Medical): No  . Lack of Transportation (Non-Medical): No  Physical Activity: Sufficiently Active  . Days of Exercise per Week: 5 days  . Minutes of Exercise per Session: 30 min  Stress: Stress Concern Present  . Feeling of Stress : Very much  Social Connections: Not Isolated  . Frequency of Communication with Friends and Family: More than three times a week  . Frequency of Social Gatherings with Friends and Family: More than three times a week  . Attends Religious Services: 1 to 4 times per year  . Active Member of Clubs or Organizations: Yes  . Attends English as a second language teacher Meetings: More than 4 times per year  . Marital Status: Married  Human resources officer Violence: Not At Risk  . Fear of Current or Ex-Partner: No  . Emotionally Abused: No  . Physically Abused: No  . Sexually Abused: No     Current Outpatient Medications:  .  aspirin EC 81 MG tablet, Take 1 tablet (81 mg total) by mouth daily., Disp: 30 tablet, Rfl: 0 .  atorvastatin (LIPITOR) 40 MG tablet, Take 1 tablet (40 mg total) by mouth daily., Disp: 90 tablet, Rfl: 1 .  Nebivolol HCl 20 MG TABS, Take 1 tablet (20 mg total) by mouth daily. In place of Exforge, Disp: 90 tablet, Rfl: 1 .  olmesartan-hydrochlorothiazide (BENICAR HCT) 20-12.5 MG tablet, Take 1 tablet by mouth daily., Disp: 90 tablet, Rfl: 1 .  tadalafil (CIALIS) 20 MG tablet, Take 1 tablet (20 mg total) by mouth daily as needed for erectile dysfunction., Disp: 30 tablet, Rfl: 1 .  traZODone (DESYREL) 100 MG tablet, Take 1 tablet (100 mg total) by mouth at bedtime as needed for sleep., Disp: 90 tablet, Rfl: 1 .  Multiple Vitamins-Minerals (MULTIVITAMIN) tablet, Take 1 tablet by mouth daily. (Patient not taking: Reported on 07/25/2019), Disp: 30 tablet, Rfl: 0  No Known Allergies   ROS  Constitutional: Negative for fever or weight change.  Respiratory: Negative for cough and shortness of breath.   Cardiovascular: Negative  for chest pain or palpitations.  Gastrointestinal: Negative for abdominal pain, no bowel changes.  Musculoskeletal: Negative for gait problem or joint swelling.  Skin: Negative for rash.  Neurological: Negative for dizziness or headache.  No other specific complaints in a complete review of systems (except as listed in HPI above).  Objective  Vitals:   07/25/19 0902 07/25/19 0903  BP: (!) 160/80 (!) 150/80  Pulse: 94   Resp: 16   Temp: (!) 96.9 F (36.1 C)   TempSrc: Temporal   SpO2: 98%   Weight: 186 lb 11.2 oz (84.7 kg)   Height: 6' (1.829 m)     Body mass index is 25.32 kg/m.  Physical  Exam  Constitutional: Patient appears well-developed and well-nourished. No distress.  HENT: Head: Normocephalic and atraumatic. Ears: B TMs ok, no erythema or effusion; Nose: Nose normal. Mouth/Throat: Oropharynx is clear and moist. No oropharyngeal exudate.  Eyes: Conjunctivae and EOM are normal. Pupils are equal, round, and reactive to light. No scleral icterus.  Neck: Normal range of motion. Neck supple. No JVD present. No thyromegaly present.  Cardiovascular: Normal rate, regular rhythm and normal heart sounds.  No murmur heard. No BLE edema. Pulmonary/Chest: Effort normal and breath sounds normal. No respiratory distress. Abdominal: Soft. Bowel sounds are normal, no distension. There is no tenderness. no masses MALE GENITALIA: Normal descended testes bilaterally, no masses palpated, no hernias, no lesions, no discharge  RECTAL: not done Musculoskeletal: Normal range of motion, no joint effusions. No gross deformities Neurological: he is alert and oriented to person, place, and time. No cranial nerve deficit. Coordination, balance, strength, speech and gait are normal.  Skin: Skin is warm and dry. No rash noted. No erythema.  Psychiatric: Patient has a normal mood and affect. behavior is normal. Judgment and thought content normal.  PHQ2/9: Depression screen [facility] 2/9 07/25/2019 03/07/2019 12/01/2018 10/26/2018 06/13/2018  Decreased Interest 0 0 0 0 0  Down, Depressed, Hopeless 0 0 0 0 0  PHQ - 2 Score 0 0 0 0 0  Altered sleeping 0 0 2 2 0  Tired, decreased energy 0 0 0 0 0  Change in appetite 0 0 0 0 0  Feeling bad or failure about yourself  0 0 0 0 0  Trouble concentrating 0 0 0 0 0  Moving slowly or fidgety/restless 0 0 0 0 0  Suicidal thoughts 0 0 0 0 0  PHQ-9 Score 0 0 2 2 0  Difficult doing work/chores - - Not difficult at all Not difficult at all Not difficult at all    Fall Risk: Fall Risk  07/25/2019 03/07/2019 12/01/2018 10/26/2018 06/13/2018  Falls in the past year? 0 0 0 0 0   Number falls in past yr: 0 0 0 0 0  Injury with Fall? 0 0 0 0 0  Follow up - - - - Falls evaluation completed     Functional Status Survey: Is the patient deaf or have difficulty hearing?: No Does the patient have difficulty seeing, even when wearing glasses/contacts?: No Does the patient have difficulty concentrating, remembering, or making decisions?: No Does the patient have difficulty walking or climbing stairs?: No Does the patient have difficulty dressing or bathing?: No Does the patient have difficulty doing errands alone such as visiting a doctor's office or shopping?: No    Assessment & Plan  1. Well adult exam   2. Colon cancer screening  He will call me back   3. Other male erectile dysfunction  -  tadalafil (CIALIS) 20 MG tablet; Take 1 tablet (20 mg total) by mouth daily as needed for erectile dysfunction.  Dispense: 30 tablet; Refill: 1  4. Primary insomnia  - traZODone (DESYREL) 100 MG tablet; Take 1 tablet (100 mg total) by mouth at bedtime as needed for sleep.  Dispense: 90 tablet; Refill: 1  5. Hypertension, benign  - olmesartan-hydrochlorothiazide (BENICAR HCT) 20-12.5 MG tablet; Take 1 tablet by mouth daily.  Dispense: 90 tablet; Refill: 1 - Nebivolol HCl 20 MG TABS; Take 1 tablet (20 mg total) by mouth daily. In place of Exforge  Dispense: 90 tablet; Refill: 1 - Microalbumin / creatinine urine ratio - COMPLETE METABOLIC PANEL WITH GFR - CBC with Differential/Platelet  6. Sinus tachycardia  - Nebivolol HCl 20 MG TABS; Take 1 tablet (20 mg total) by mouth daily. In place of Exforge  Dispense: 90 tablet; Refill: 1  7. Diabetes mellitus screening  - Hemoglobin A1c  8. Hyperlipidemia LDL goal <130  - atorvastatin (LIPITOR) 40 MG tablet; Take 1 tablet (40 mg total) by mouth daily.  Dispense: 90 tablet; Refill: 1 - Lipid panel   -Prostate cancer screening and PSA options (with potential risks and benefits of testing vs not testing) were discussed  along with recent recs/guidelines. -USPSTF grade A and B recommendations reviewed with patient; age-appropriate recommendations, preventive care, screening tests, etc discussed and encouraged; healthy living encouraged; see AVS for patient education given to patient -Discussed importance of 150 minutes of physical activity weekly, eat two servings of fish weekly, eat one serving of tree nuts ( cashews, pistachios, pecans, almonds.Marland Kitchen) every other day, eat 6 servings of fruit/vegetables daily and drink plenty of water and avoid sweet beverages.

## 2019-07-26 LAB — LIPID PANEL
Cholesterol: 183 mg/dL (ref <200)
HDL: 54 mg/dL (ref >=40)
LDL Cholesterol (Calc): 106 mg/dL (calc) — ABNORMAL HIGH
Non-HDL Cholesterol (Calc): 129 mg/dL (calc) (ref <130)
Total CHOL/HDL Ratio: 3.4 (calc) (ref <5.0)
Triglycerides: 133 mg/dL (ref <150)

## 2019-07-26 LAB — CBC WITH DIFFERENTIAL/PLATELET
Absolute Monocytes: 559 cells/uL (ref 200–950)
Basophils Absolute: 21 cells/uL (ref 0–200)
Basophils Relative: 0.3 %
Eosinophils Absolute: 69 cells/uL (ref 15–500)
Eosinophils Relative: 1 %
HCT: 42.5 % (ref 38.5–50.0)
Hemoglobin: 13.9 g/dL (ref 13.2–17.1)
Lymphs Abs: 1063 cells/uL (ref 850–3900)
MCH: 29.4 pg (ref 27.0–33.0)
MCHC: 32.7 g/dL (ref 32.0–36.0)
MCV: 89.9 fL (ref 80.0–100.0)
MPV: 10.2 fL (ref 7.5–12.5)
Monocytes Relative: 8.1 %
Neutro Abs: 5189 cells/uL (ref 1500–7800)
Neutrophils Relative %: 75.2 %
Platelets: 274 10*3/uL (ref 140–400)
RBC: 4.73 10*6/uL (ref 4.20–5.80)
RDW: 12.8 % (ref 11.0–15.0)
Total Lymphocyte: 15.4 %
WBC: 6.9 10*3/uL (ref 3.8–10.8)

## 2019-07-26 LAB — COMPLETE METABOLIC PANEL WITH GFR
AG Ratio: 1.3 (calc) (ref 1.0–2.5)
ALT: 34 U/L (ref 9–46)
AST: 22 U/L (ref 10–35)
Albumin: 4.6 g/dL (ref 3.6–5.1)
Alkaline phosphatase (APISO): 68 U/L (ref 35–144)
BUN: 16 mg/dL (ref 7–25)
CO2: 26 mmol/L (ref 20–32)
Calcium: 9.3 mg/dL (ref 8.6–10.3)
Chloride: 102 mmol/L (ref 98–110)
Creat: 1.02 mg/dL (ref 0.70–1.25)
GFR, Est African American: 92 mL/min/{1.73_m2} (ref >=60)
GFR, Est Non African American: 79 mL/min/{1.73_m2} (ref >=60)
Globulin: 3.5 g/dL (calc) (ref 1.9–3.7)
Glucose, Bld: 102 mg/dL — ABNORMAL HIGH (ref 65–99)
Potassium: 4.2 mmol/L (ref 3.5–5.3)
Sodium: 136 mmol/L (ref 135–146)
Total Bilirubin: 0.5 mg/dL (ref 0.2–1.2)
Total Protein: 8.1 g/dL (ref 6.1–8.1)

## 2019-07-26 LAB — HEMOGLOBIN A1C
Hgb A1c MFr Bld: 5.6 % of total Hgb (ref <5.7)
Mean Plasma Glucose: 114 (calc)
eAG (mmol/L): 6.3 (calc)

## 2019-07-26 LAB — MICROALBUMIN / CREATININE URINE RATIO
Creatinine, Urine: 84 mg/dL (ref 20–320)
Microalb, Ur: <0.2 mg/dL

## 2019-07-27 ENCOUNTER — Telehealth: Payer: Self-pay | Admitting: Family Medicine

## 2019-07-27 DIAGNOSIS — R000 Tachycardia, unspecified: Secondary | ICD-10-CM

## 2019-07-27 DIAGNOSIS — I10 Essential (primary) hypertension: Secondary | ICD-10-CM

## 2019-07-27 NOTE — Telephone Encounter (Signed)
Patient has sent in Rx request for different dosage of medication prescribed in office visit. Rx needs to be forwarded to [facility], [place]. Sent for PCP review of request

## 2019-07-27 NOTE — Telephone Encounter (Signed)
Medication Refill - Medication:  tadalafil (CIALIS) 20 MG tablet  Has the patient contacted their pharmacy? Yes.   (Agent: If no, request that the patient contact the pharmacy for the refill.) (Agent: If yes, when and what did the pharmacy advise?)  Preferred Pharmacy (with phone number or street name):  [place], [place] [facility]  [address] [place] [address]  Phone: [phone] Fax: [phone]    Agent: Please be advised that RX refills may take up to 3 business days. We ask that you follow-up with your pharmacy.

## 2019-07-27 NOTE — Telephone Encounter (Signed)
Medication Refill - Medication: nebivolol (BYSTOLIC) 10 MG tablet [id number]   (medication discontinue, however patient says this refill has been sent to the wrong pharmacy)  Has the patient contacted their pharmacy? Yes.   (Agent: If no, request that the patient contact the pharmacy for the refill.) (Agent: If yes, when and what did the pharmacy advise?)  Preferred Pharmacy (with phone number or street name):  [place], [place] [facility] [place] [facility]  [address] [place] [address]  Phone: [phone] Fax: [phone]    Agent: Please be advised that RX refills may take up to 3 business days. We ask that you follow-up with your pharmacy.

## 2019-07-28 ENCOUNTER — Other Ambulatory Visit: Payer: Self-pay | Admitting: Family Medicine

## 2019-07-28 DIAGNOSIS — R000 Tachycardia, unspecified: Secondary | ICD-10-CM

## 2019-07-28 DIAGNOSIS — N528 Other male erectile dysfunction: Secondary | ICD-10-CM

## 2019-07-28 DIAGNOSIS — I10 Essential (primary) hypertension: Secondary | ICD-10-CM

## 2019-07-28 MED ORDER — NEBIVOLOL HCL 20 MG PO TABS: 20.0000 mg | ORAL_TABLET | Freq: Every day | ORAL | 1 refills | Status: DC

## 2019-07-28 MED ORDER — TADALAFIL 20 MG PO TABS: 20.0000 mg | ORAL_TABLET | Freq: Every day | ORAL | 1 refills | Status: DC | PRN

## 2019-07-28 NOTE — Telephone Encounter (Signed)
Called [facility] they will transfer prescription to [facility] and retrieve the Bystolic rx.

## 2019-08-02 ENCOUNTER — Other Ambulatory Visit: Payer: Self-pay | Admitting: Family Medicine

## 2019-08-02 DIAGNOSIS — I10 Essential (primary) hypertension: Secondary | ICD-10-CM

## 2019-08-02 MED ORDER — OLMESARTAN MEDOXOMIL-HCTZ 20-12.5 MG PO TABS: 1.0000 | ORAL_TABLET | Freq: Every day | ORAL | 1 refills | Status: DC

## 2019-08-02 NOTE — Telephone Encounter (Signed)
Resending to Pharmacy. Did not receive 07/27/19 prescription.

## 2019-08-02 NOTE — Telephone Encounter (Signed)
Pt stated [facility] did not have olmesartan-hydrochlorothiazide (BENICAR HCT) 20-12.5 MG tablet when he went to pick up his other medications. Requesting it to be resent. Please advise.

## 2019-08-23 ENCOUNTER — Ambulatory Visit: Payer: Self-pay | Attending: Internal Medicine

## 2019-08-23 DIAGNOSIS — Z23 Encounter for immunization: Secondary | ICD-10-CM

## 2019-08-23 NOTE — Progress Notes (Signed)
   Covid-19 Vaccination Clinic  Name:  Charles Valentine    MRN: [id number] DOB: 1955/05/09  08/23/2019  [name] was observed post Covid-19 immunization for 15 minutes without incident. He was provided with Vaccine Information Sheet and instruction to access the V-Safe system.   [name] was instructed to call 911 with any severe reactions post vaccine: Marland Kitchen Difficulty breathing  . Swelling of face and throat  . A fast heartbeat  . A bad rash all over body  . Dizziness and weakness   Immunizations Administered    Name Date Dose VIS Date Route   Pfizer COVID-19 Vaccine 08/23/2019  8:09 AM 0.3 mL 06/14/2018 Intramuscular   Manufacturer: [place]   Lot: [id number]   [place]: [id number]

## 2020-01-22 NOTE — Progress Notes (Signed)
Name: Charles Valentine   MRN: [id number]    DOB: 03/20/1956   Date:01/24/2020       Progress Note  Subjective  Chief Complaint  Chief Complaint  Patient presents with  . Follow-up    6 month follow up    HPI  HTN: diagnosedin 2018, he wasNorvasc 10 mg also tried Exforge but switchedto Bystolic because of tachycardia. Seen by cardiologist, Dr. Saunders Revel. BP today is elevated again, he states he skips medeications on the weekends, discussed importance of compliance, we will also adjust dose of Benicar from 20/12.5 to 40.12.5 mg. He states he has been anxious about his mother, she is at [facility]    Echo 08/2016 : - Left ventricle: The cavity size was normal. Wall thickness was normal. Systolic function was normal. The estimated ejection fraction was in the range of 60% to 65%. Wall motion was normal; there were no regional wall motion abnormalities. Left ventricular diastolic function parameters were normal.  Hyperlipidemia: he is on statin therapy now and tolerating it well, also on aspirin, last LDL was not at goal, he is willing to switch from Atorvastatin 40 mg to Crestor 40 mg   ED: he is taking Cialis occasionally,doing well on medication, no side effects . He still has medication at home   Insomnia:he states retired last year, and was sleeping okay, but states his wife snores, taking Trazodone prn   Patient Active Problem List   Diagnosis Date Noted  . Sinus tachycardia 09/02/2016  . Essential hypertension 09/02/2016  . Eczema 06/08/2016  . Hyperlipidemia LDL goal <130 04/08/2016  . ED (erectile dysfunction)     No past surgical history on file.  Family History  Problem Relation Age of Onset  . Hypertension Mother   . Diabetes Mother   . Heart disease Mother 39       CABG  . Heart attack Mother 68  . Obesity Sister   . Drug abuse Brother     Social History   Tobacco Use  . Smoking status: Former Smoker    Packs/day: 0.50     Years: 10.00    Pack years: 5.00    Types: Cigarettes    Quit date: 1989    Years since quitting: 32.7  . Smokeless tobacco: Never Used  Substance Use Topics  . Alcohol use: Yes    Alcohol/week: 8.0 standard drinks    Types: 6 Cans of beer, 2 Shots of liquor per week     Current Outpatient Medications:  .  aspirin EC 81 MG tablet, Take 1 tablet (81 mg total) by mouth daily., Disp: 30 tablet, Rfl: 0 .  Nebivolol HCl 20 MG TABS, Take 1 tablet (20 mg total) by mouth daily. In place of Exforge, Disp: 90 tablet, Rfl: 1 .  tadalafil (CIALIS) 20 MG tablet, Take 1 tablet (20 mg total) by mouth daily as needed for erectile dysfunction., Disp: 30 tablet, Rfl: 1 .  traZODone (DESYREL) 100 MG tablet, Take 1 tablet (100 mg total) by mouth at bedtime as needed for sleep., Disp: 90 tablet, Rfl: 1 .  Multiple Vitamins-Minerals (MULTIVITAMIN) tablet, Take 1 tablet by mouth daily. (Patient not taking: Reported on 07/25/2019), Disp: 30 tablet, Rfl: 0 .  olmesartan-hydrochlorothiazide (BENICAR HCT) 40-12.5 MG tablet, Take 1 tablet by mouth daily., Disp: 90 tablet, Rfl: 1 .  rosuvastatin (CRESTOR) 40 MG tablet, Take 1 tablet (40 mg total) by mouth daily., Disp: 90 tablet, Rfl: 1  No Known Allergies  I personally reviewed  active problem list, medication list, allergies, family history, social history, health maintenance with the patient/caregiver today.   ROS  Constitutional: Negative for fever or weight change.  Respiratory: Negative for cough and shortness of breath.   Cardiovascular: Negative for chest pain or palpitations.  Gastrointestinal: Negative for abdominal pain, no bowel changes.  Musculoskeletal: Negative for gait problem or joint swelling.  Skin: Negative for rash.  Neurological: Negative for dizziness or headache.  No other specific complaints in a complete review of systems (except as listed in HPI above).  Objective  Vitals:   01/24/20 0916  BP: (!) 150/70  Pulse: 61  Resp: 16   Temp: 98 F (36.7 C)  SpO2: 100%  Weight: 186 lb 8 oz (84.6 kg)  Height: 6' (1.829 m)    Body mass index is 25.29 kg/m.  Physical Exam  Constitutional: Patient appears well-developed and well-nourished. No distress.  HEENT: head atraumatic, normocephalic, pupils equal and reactive to light,  neck supple Cardiovascular: Normal rate, regular rhythm and normal heart sounds.  No murmur heard. No BLE edema. Pulmonary/Chest: Effort normal and breath sounds normal. No respiratory distress. Abdominal: Soft.  There is no tenderness. Psychiatric: Patient has a normal mood and affect. behavior is normal. Judgment and thought content normal.  PHQ2/9: Depression screen [facility] 2/9 07/25/2019 03/07/2019 12/01/2018 10/26/2018 06/13/2018  Decreased Interest 0 0 0 0 0  Down, Depressed, Hopeless 0 0 0 0 0  PHQ - 2 Score 0 0 0 0 0  Altered sleeping 0 0 2 2 0  Tired, decreased energy 0 0 0 0 0  Change in appetite 0 0 0 0 0  Feeling bad or failure about yourself  0 0 0 0 0  Trouble concentrating 0 0 0 0 0  Moving slowly or fidgety/restless 0 0 0 0 0  Suicidal thoughts 0 0 0 0 0  PHQ-9 Score 0 0 2 2 0  Difficult doing work/chores - - Not difficult at all Not difficult at all Not difficult at all    phq 9 is negative   Fall Risk: Fall Risk  01/24/2020 07/25/2019 03/07/2019 12/01/2018 10/26/2018  Falls in the past year? 0 0 0 0 0  Number falls in past yr: 0 0 0 0 0  Injury with Fall? 0 0 0 0 0  Follow up - - - - -     Functional Status Survey: Is the patient deaf or have difficulty hearing?: No Does the patient have difficulty seeing, even when wearing glasses/contacts?: No Does the patient have difficulty concentrating, remembering, or making decisions?: No Does the patient have difficulty walking or climbing stairs?: No Does the patient have difficulty dressing or bathing?: No Does the patient have difficulty doing errands alone such as visiting a doctor's office or shopping?: No    Assessment &  Plan  1. Colon cancer screening  He states wants to hold off for now   2. Need for immunization against influenza  - Flu Vaccine QUAD 36+ mos IM  3. Hypertension, benign  - olmesartan-hydrochlorothiazide (BENICAR HCT) 40-12.5 MG tablet; Take 1 tablet by mouth daily.  Dispense: 90 tablet; Refill: 1 - Nebivolol HCl 20 MG TABS; Take 1 tablet (20 mg total) by mouth daily. In place of Exforge  Dispense: 90 tablet; Refill: 1  4. Sinus tachycardia  - Nebivolol HCl 20 MG TABS; Take 1 tablet (20 mg total) by mouth daily. In place of Exforge  Dispense: 90 tablet; Refill: 1  5. Pure hypercholesterolemia  -  rosuvastatin (CRESTOR) 40 MG tablet; Take 1 tablet (40 mg total) by mouth daily.  Dispense: 90 tablet; Refill: 1.

## 2020-01-24 ENCOUNTER — Ambulatory Visit: Payer: Managed Care, Other (non HMO) | Admitting: Family Medicine

## 2020-01-24 ENCOUNTER — Other Ambulatory Visit: Payer: Self-pay

## 2020-01-24 ENCOUNTER — Encounter: Payer: Self-pay | Admitting: Family Medicine

## 2020-01-24 VITALS — BP 150/70 | HR 61 | Temp 98.0°F | Resp 16 | Ht 72.0 in | Wt 186.5 lb

## 2020-01-24 DIAGNOSIS — I10 Essential (primary) hypertension: Secondary | ICD-10-CM

## 2020-01-24 DIAGNOSIS — Z1211 Encounter for screening for malignant neoplasm of colon: Secondary | ICD-10-CM

## 2020-01-24 DIAGNOSIS — R000 Tachycardia, unspecified: Secondary | ICD-10-CM | POA: Diagnosis not present

## 2020-01-24 DIAGNOSIS — Z23 Encounter for immunization: Secondary | ICD-10-CM

## 2020-01-24 DIAGNOSIS — E7800 Pure hypercholesterolemia, unspecified: Secondary | ICD-10-CM

## 2020-01-24 MED ORDER — OLMESARTAN MEDOXOMIL-HCTZ 40-12.5 MG PO TABS: 1.0000 | ORAL_TABLET | Freq: Every day | ORAL | 1 refills | Status: DC

## 2020-01-24 MED ORDER — NEBIVOLOL HCL 20 MG PO TABS: 20.0000 mg | ORAL_TABLET | Freq: Every day | ORAL | 1 refills | Status: DC

## 2020-01-24 MED ORDER — ROSUVASTATIN CALCIUM 40 MG PO TABS: 40.0000 mg | ORAL_TABLET | Freq: Every day | ORAL | 1 refills | Status: DC

## 2020-04-25 ENCOUNTER — Other Ambulatory Visit: Payer: Self-pay | Admitting: Family Medicine

## 2020-04-25 DIAGNOSIS — E7800 Pure hypercholesterolemia, unspecified: Secondary | ICD-10-CM

## 2020-04-25 DIAGNOSIS — I10 Essential (primary) hypertension: Secondary | ICD-10-CM

## 2020-07-15 ENCOUNTER — Ambulatory Visit: Payer: Self-pay | Admitting: *Deleted

## 2020-07-15 NOTE — Progress Notes (Signed)
Name: Charles Valentine   MRN: [id number]    DOB: Nov 19, 1955   Date:07/16/2020       Progress Note  Subjective  Chief Complaint  Diarrhea x2 weeks  HPI  Diarrhea: he states his mother-in-law died a couple of weeks ago, shortly after he had one episode of watery stools. He thought it was from something he ate. He had normal stools for about one week and last week he started to have watery stools once per day, it is getting worse because he is now getting up during the night to have watery stools. He vomited once over the past couple of weeks. He has intermittent bloating, no blood or mucus in stools. He has lack of appetite, but has been trying to eat a little, he had soup last night. He has been trying to drink more water because his urine has been darker / concentrated than usual. No fever or chills. He has cramping before the urge to have a bowel movement, no episodes of incontinence. He is not sure about family history of GI cancer. He has lost 15 lbs since his last visit, he thinks it was all over the past two weeks. He is eating very little No sick contacts. No fever or chills. He states home covid test was negative  He is not up to date with colon cancer screen, cologuard could not be processed, explained importance of having a colonoscopy  Dyslipidemia: he has been taking crestor, we will recheck labs today, denies myalgia  HTN: last time bp was elevated, today towards low end of normal, taking medication, reminded him to stay hydrated. Denies chest pain or palpitation.   Patient Active Problem List   Diagnosis Date Noted  . Sinus tachycardia 09/02/2016  . Essential hypertension 09/02/2016  . Eczema 06/08/2016  . Hyperlipidemia LDL goal <130 04/08/2016  . ED (erectile dysfunction)     No past surgical history on file.  Family History  Problem Relation Age of Onset  . Hypertension Mother   . Diabetes Mother   . Heart disease Mother 20       CABG  . Heart attack Mother 66  .  Obesity Sister   . Drug abuse Brother     Social History   Tobacco Use  . Smoking status: Former Smoker    Packs/day: 0.50    Years: 10.00    Pack years: 5.00    Types: Cigarettes    Quit date: 1989    Years since quitting: 33.2  . Smokeless tobacco: Never Used  Substance Use Topics  . Alcohol use: Yes    Alcohol/week: 8.0 standard drinks    Types: 6 Cans of beer, 2 Shots of liquor per week     Current Outpatient Medications:  .  aspirin EC 81 MG tablet, Take 1 tablet (81 mg total) by mouth daily., Disp: 30 tablet, Rfl: 0 .  Nebivolol HCl 20 MG TABS, Take 1 tablet (20 mg total) by mouth daily. In place of Exforge, Disp: 90 tablet, Rfl: 1 .  olmesartan-hydrochlorothiazide (BENICAR HCT) 40-12.5 MG tablet, TAKE 1 TABLET BY MOUTH DAILY, Disp: 90 tablet, Rfl: 0 .  rosuvastatin (CRESTOR) 40 MG tablet, TAKE 1 TABLET(40 MG) BY MOUTH DAILY, Disp: 90 tablet, Rfl: 0 .  tadalafil (CIALIS) 20 MG tablet, Take 1 tablet (20 mg total) by mouth daily as needed for erectile dysfunction., Disp: 30 tablet, Rfl: 1 .  traZODone (DESYREL) 100 MG tablet, Take 1 tablet (100 mg total) by mouth at bedtime  as needed for sleep., Disp: 90 tablet, Rfl: 1 .  Multiple Vitamins-Minerals (MULTIVITAMIN) tablet, Take 1 tablet by mouth daily. (Patient not taking: No sig reported), Disp: 30 tablet, Rfl: 0  No Known Allergies  I personally reviewed active problem list, medication list, allergies, family history, social history, health maintenance with the patient/caregiver today.   ROS  Constitutional: Negative for fever, positive for  weight change.  Respiratory: Negative for cough and shortness of breath.   Cardiovascular: Negative for chest pain or palpitations.  Gastrointestinal: Negative for abdominal pain, no bowel changes.  Musculoskeletal: Negative for gait problem or joint swelling.  Skin: Negative for rash.  Neurological: Negative for dizziness or headache.  No other specific complaints in a complete  review of systems (except as listed in HPI above).  Objective  Vitals:   07/16/20 0917  BP: 118/74  Pulse: 86  Resp: 16  Temp: (!) 97.5 F (36.4 C)  SpO2: 99%  Weight: 172 lb 12.8 oz (78.4 kg)  Height: 6' (1.829 m)    Body mass index is 23.44 kg/m.  Physical Exam  Constitutional: Patient appears well-developed and well-nourished. No distress.  HEENT: head atraumatic, normocephalic, pupils equal and reactive to light,  neck supple Cardiovascular: Normal rate, regular rhythm and normal heart sounds.  No murmur heard. No BLE edema. Pulmonary/Chest: Effort normal and breath sounds normal. No respiratory distress. Abdominal: Soft.  There is no tenderness. Psychiatric: Patient has a normal mood and affect. behavior is normal. Judgment and thought content normal.  PHQ2/9: Depression screen [facility] 2/9 07/16/2020 07/25/2019 03/07/2019 12/01/2018 10/26/2018  Decreased Interest 0 0 0 0 0  Down, Depressed, Hopeless 0 0 0 0 0  PHQ - 2 Score 0 0 0 0 0  Altered sleeping - 0 0 2 2  Tired, decreased energy - 0 0 0 0  Change in appetite - 0 0 0 0  Feeling bad or failure about yourself  - 0 0 0 0  Trouble concentrating - 0 0 0 0  Moving slowly or fidgety/restless - 0 0 0 0  Suicidal thoughts - 0 0 0 0  PHQ-9 Score - 0 0 2 2  Difficult doing work/chores - - - Not difficult at all Not difficult at all    phq 9 is negative   Fall Risk: Fall Risk  07/16/2020 01/24/2020 07/25/2019 03/07/2019 12/01/2018  Falls in the past year? 0 0 0 0 0  Number falls in past yr: 0 0 0 0 0  Injury with Fall? 0 0 0 0 0  Follow up - - - - -     Functional Status Survey: Is the patient deaf or have difficulty hearing?: No Does the patient have difficulty seeing, even when wearing glasses/contacts?: No Does the patient have difficulty concentrating, remembering, or making decisions?: No Does the patient have difficulty walking or climbing stairs?: No Does the patient have difficulty dressing or bathing?: No Does  the patient have difficulty doing errands alone such as visiting a doctor's office or shopping?: No    Assessment & Plan  1. Diarrhea, unspecified type  - Ambulatory referral to Gastroenterology - GI Profile, Stool, PCR - Gastrointestinal Pathogen Panel PCR  2. Dark urine  - POCT urinalysis dipstick  3. Colon cancer screening  - Ambulatory referral to Gastroenterology  4. Unintentional weight loss  - CBC with Differential/Platelet - COMPLETE METABOLIC PANEL WITH GFR - TSH  5. Hyperlipidemia LDL goal <130  - Lipid panel

## 2020-07-15 NOTE — Telephone Encounter (Signed)
Per initial encounter,"Pt has been having diarrhea for 2wk and going out of town on this Friday. [facility] does not have any openings this week"; contacted p regarding his symptoms; he is having 1-2 episodes of  watery stools daily x 2 weeks; he has not changed his diet; the pt says his mother-in-law recently died and he may have eaten some spoiled food; he had 1 episode of emesis on 07/14/20 after eating ham, egg, and cheese sandwich; he denies fever; the pt says he has crampy abdominal pain when he needs to have a BM; he rates the pain at 4 out of 10; recommendations made per nurse triage protocol; he verbalized understanding; decision tree completed; the pt normally sees Dr Ancil Boozer at [facility] but she has no availability within time frame per protocol; pt declied MyChart appt with Delsa Grana on 07/19/20 at 1320 because he will be in [facility]; will route to office for scheduling; the pt can be contacted at [phone].   Reason for Disposition . [1] MILD diarrhea (e.g., 1-3 or more stools than normal in past 24 hours) without known cause AND [2] present >  7 days  Answer Assessment - Initial Assessment Questions 1. DIARRHEA SEVERITY: "How bad is the diarrhea?" "How many extra stools have you had in the past 24 hours than normal?"    - NO DIARRHEA (SCALE 0)   - MILD (SCALE 1-3): Few loose or mushy BMs; increase of 1-3 stools over normal daily number of stools; mild increase in ostomy output.   -  MODERATE (SCALE 4-7): Increase of 4-6 stools daily over normal; moderate increase in ostomy output. * SEVERE (SCALE 8-10; OR 'WORST POSSIBLE'): Increase of 7 or more stools daily over normal; moderate increase in ostomy output; incontinence.     mild 2. ONSET: "When did the diarrhea begin?"   07/01/20 3. BM CONSISTENCY: "How loose or watery is the diarrhea?"     watery 4. VOMITING: "Are you also vomiting?" If Yes, ask: "How many times in the past 24 hours?"      Emesis x 1 on 06/2720 5. ABDOMINAL  PAIN: "Are you having any abdominal pain?" If Yes, ask: "What does it feel like?" (e.g., crampy, dull, intermittent, constant)      Yes when needs to have BM; intermittent; cramping 6. ABDOMINAL PAIN SEVERITY: If present, ask: "How bad is the pain?"  (e.g., Scale 1-10; mild, moderate, or severe)   - MILD (1-3): doesn't interfere with normal activities, abdomen soft and not tender to touch    - MODERATE (4-7): interferes with normal activities or awakens from sleep, tender to touch    - SEVERE (8-10): excruciating pain, doubled over, unable to do any normal activities       4 out of 10 7. ORAL INTAKE: If vomiting, "Have you been able to drink liquids?" "How much fluids have you had in the past 24 hours?"    Emesis x 1 on 07/14/20 after eating ham, egg and cheese sandwich 8. HYDRATION: "Any signs of dehydration?" (e.g., dry mouth [not just dry lips], too weak to stand, dizziness, new weight loss) "When did you last urinate?"     none 9. EXPOSURE: "Have you traveled to a foreign country recently?" "Have you been exposed to anyone with diarrhea?" "Could you have eaten any food that was spoiled?"     ? spoiled food at [place]; diarrhea started 4-5 days prior to funeral 10. ANTIBIOTIC USE: "Are you taking antibiotics now or have you taken antibiotics  in the past 2 months?"      no 11. OTHER SYMPTOMS: "Do you have any other symptoms?" (e.g., fever, blood in stool)       no 12. PREGNANCY: "Is there any chance you are pregnant?" "When was your last menstrual period?"       n/a  Protocols used: DIARRHEA-A-AH

## 2020-07-15 NOTE — Telephone Encounter (Signed)
appt scheduled with Dr Ancil Boozer for tomorrow for in office visit

## 2020-07-16 ENCOUNTER — Telehealth: Payer: Self-pay

## 2020-07-16 ENCOUNTER — Other Ambulatory Visit: Payer: Self-pay

## 2020-07-16 ENCOUNTER — Encounter: Payer: Self-pay | Admitting: *Deleted

## 2020-07-16 ENCOUNTER — Encounter: Payer: Self-pay | Admitting: Family Medicine

## 2020-07-16 ENCOUNTER — Ambulatory Visit: Payer: Managed Care, Other (non HMO) | Admitting: Family Medicine

## 2020-07-16 VITALS — BP 118/74 | HR 86 | Temp 97.5°F | Resp 16 | Ht 72.0 in | Wt 172.8 lb

## 2020-07-16 DIAGNOSIS — Z1211 Encounter for screening for malignant neoplasm of colon: Secondary | ICD-10-CM

## 2020-07-16 DIAGNOSIS — R197 Diarrhea, unspecified: Secondary | ICD-10-CM

## 2020-07-16 DIAGNOSIS — R82998 Other abnormal findings in urine: Secondary | ICD-10-CM | POA: Diagnosis not present

## 2020-07-16 DIAGNOSIS — R634 Abnormal weight loss: Secondary | ICD-10-CM

## 2020-07-16 DIAGNOSIS — E785 Hyperlipidemia, unspecified: Secondary | ICD-10-CM

## 2020-07-16 LAB — POCT URINALYSIS DIPSTICK
Bilirubin, UA: NEGATIVE
Blood, UA: NEGATIVE
Glucose, UA: NEGATIVE
Leukocytes, UA: NEGATIVE
Nitrite, UA: NEGATIVE
Protein, UA: NEGATIVE
Spec Grav, UA: >=1.03 — AB (ref 1.010–1.025)
Urobilinogen, UA: 0.2 E.U./dL
pH, UA: 6 (ref 5.0–8.0)

## 2020-07-16 NOTE — Telephone Encounter (Signed)
Spoke with pt and he said it began 2 weeks ago and he did take the rapid test at home on Friday and it was negative. Pt is on the way please advise

## 2020-07-16 NOTE — Telephone Encounter (Signed)
Pt was seen today. He said you mentioned for him to get imodium but you also mentioned getting something generic. Please return pt call to discuss what it is he is suppose to get. He questioned if something was called into his pharmacy but I did not see anything

## 2020-07-17 ENCOUNTER — Encounter: Payer: Self-pay | Admitting: Emergency Medicine

## 2020-07-17 ENCOUNTER — Emergency Department: Payer: Managed Care, Other (non HMO)

## 2020-07-17 ENCOUNTER — Telehealth: Payer: Self-pay

## 2020-07-17 ENCOUNTER — Inpatient Hospital Stay: Payer: Managed Care, Other (non HMO)

## 2020-07-17 ENCOUNTER — Other Ambulatory Visit: Payer: Self-pay

## 2020-07-17 ENCOUNTER — Inpatient Hospital Stay
Admission: EM | Admit: 2020-07-17 | Discharge: 2020-07-19 | DRG: 439 | Disposition: A | Discharge status: Home / Self Care | Payer: Managed Care, Other (non HMO) | Attending: Student | Admitting: Student

## 2020-07-17 DIAGNOSIS — N281 Cyst of kidney, acquired: Secondary | ICD-10-CM | POA: Diagnosis present

## 2020-07-17 DIAGNOSIS — Z8249 Family history of ischemic heart disease and other diseases of the circulatory system: Secondary | ICD-10-CM | POA: Diagnosis not present

## 2020-07-17 DIAGNOSIS — K859 Acute pancreatitis without necrosis or infection, unspecified: Secondary | ICD-10-CM | POA: Diagnosis not present

## 2020-07-17 DIAGNOSIS — I959 Hypotension, unspecified: Secondary | ICD-10-CM | POA: Diagnosis present

## 2020-07-17 DIAGNOSIS — Z79899 Other long term (current) drug therapy: Secondary | ICD-10-CM

## 2020-07-17 DIAGNOSIS — R112 Nausea with vomiting, unspecified: Secondary | ICD-10-CM | POA: Diagnosis present

## 2020-07-17 DIAGNOSIS — I10 Essential (primary) hypertension: Secondary | ICD-10-CM | POA: Diagnosis present

## 2020-07-17 DIAGNOSIS — Z20822 Contact with and (suspected) exposure to covid-19: Secondary | ICD-10-CM | POA: Diagnosis present

## 2020-07-17 DIAGNOSIS — E785 Hyperlipidemia, unspecified: Secondary | ICD-10-CM | POA: Diagnosis present

## 2020-07-17 DIAGNOSIS — R197 Diarrhea, unspecified: Secondary | ICD-10-CM | POA: Diagnosis present

## 2020-07-17 DIAGNOSIS — Z87891 Personal history of nicotine dependence: Secondary | ICD-10-CM | POA: Diagnosis not present

## 2020-07-17 DIAGNOSIS — E871 Hypo-osmolality and hyponatremia: Secondary | ICD-10-CM | POA: Diagnosis present

## 2020-07-17 DIAGNOSIS — N179 Acute kidney failure, unspecified: Secondary | ICD-10-CM | POA: Diagnosis present

## 2020-07-17 DIAGNOSIS — Z7982 Long term (current) use of aspirin: Secondary | ICD-10-CM | POA: Diagnosis not present

## 2020-07-17 DIAGNOSIS — E860 Dehydration: Secondary | ICD-10-CM | POA: Diagnosis present

## 2020-07-17 DIAGNOSIS — E7800 Pure hypercholesterolemia, unspecified: Secondary | ICD-10-CM

## 2020-07-17 HISTORY — DX: Acute kidney failure, unspecified: N17.9

## 2020-07-17 LAB — CBC
HCT: 44.3 % (ref 39.0–52.0)
Hemoglobin: 15.2 g/dL (ref 13.0–17.0)
MCH: 29.2 pg (ref 26.0–34.0)
MCHC: 34.3 g/dL (ref 30.0–36.0)
MCV: 85 fL (ref 80.0–100.0)
Platelets: 394 10*3/uL (ref 150–400)
RBC: 5.21 MIL/uL (ref 4.22–5.81)
RDW: 13.2 % (ref 11.5–15.5)
WBC: 10.5 10*3/uL (ref 4.0–10.5)
nRBC: 0 % (ref 0.0–0.2)

## 2020-07-17 LAB — COMPLETE METABOLIC PANEL WITH GFR
AG Ratio: 1.2 (calc) (ref 1.0–2.5)
ALT: 42 U/L (ref 9–46)
AST: 17 U/L (ref 10–35)
Albumin: 4.3 g/dL (ref 3.6–5.1)
Alkaline phosphatase (APISO): 64 U/L (ref 35–144)
BUN/Creatinine Ratio: 21 (calc) (ref 6–22)
BUN: 56 mg/dL — ABNORMAL HIGH (ref 7–25)
CO2: 16 mmol/L — ABNORMAL LOW (ref 20–32)
Calcium: 9.7 mg/dL (ref 8.6–10.3)
Chloride: 99 mmol/L (ref 98–110)
Creat: 2.72 mg/dL — ABNORMAL HIGH (ref 0.70–1.25)
GFR, Est African American: 28 mL/min/{1.73_m2} — ABNORMAL LOW (ref >=60)
GFR, Est Non African American: 24 mL/min/{1.73_m2} — ABNORMAL LOW (ref >=60)
Globulin: 3.5 g/dL (calc) (ref 1.9–3.7)
Glucose, Bld: 98 mg/dL (ref 65–99)
Potassium: 5.4 mmol/L — ABNORMAL HIGH (ref 3.5–5.3)
Sodium: 128 mmol/L — ABNORMAL LOW (ref 135–146)
Total Bilirubin: 0.4 mg/dL (ref 0.2–1.2)
Total Protein: 7.8 g/dL (ref 6.1–8.1)

## 2020-07-17 LAB — COMPREHENSIVE METABOLIC PANEL
ALT: 41 U/L (ref 0–44)
AST: 20 U/L (ref 15–41)
Albumin: 4.4 g/dL (ref 3.5–5.0)
Alkaline Phosphatase: 63 U/L (ref 38–126)
Anion gap: 14 (ref 5–15)
BUN: 64 mg/dL — ABNORMAL HIGH (ref 8–23)
CO2: 14 mmol/L — ABNORMAL LOW (ref 22–32)
Calcium: 9.1 mg/dL (ref 8.9–10.3)
Chloride: 102 mmol/L (ref 98–111)
Creatinine, Ser: 3 mg/dL — ABNORMAL HIGH (ref 0.61–1.24)
GFR, Estimated: 23 mL/min — ABNORMAL LOW (ref >60)
Glucose, Bld: 109 mg/dL — ABNORMAL HIGH (ref 70–99)
Potassium: 4.4 mmol/L (ref 3.5–5.1)
Sodium: 130 mmol/L — ABNORMAL LOW (ref 135–145)
Total Bilirubin: 0.9 mg/dL (ref 0.3–1.2)
Total Protein: 8 g/dL (ref 6.5–8.1)

## 2020-07-17 LAB — CBC WITH DIFFERENTIAL/PLATELET
Absolute Monocytes: 935 cells/uL (ref 200–950)
Basophils Absolute: 18 cells/uL (ref 0–200)
Basophils Relative: 0.2 %
Eosinophils Absolute: 53 cells/uL (ref 15–500)
Eosinophils Relative: 0.6 %
HCT: 46 % (ref 38.5–50.0)
Hemoglobin: 15.4 g/dL (ref 13.2–17.1)
Lymphs Abs: 1166 cells/uL (ref 850–3900)
MCH: 29.3 pg (ref 27.0–33.0)
MCHC: 33.5 g/dL (ref 32.0–36.0)
MCV: 87.5 fL (ref 80.0–100.0)
MPV: 10.1 fL (ref 7.5–12.5)
Monocytes Relative: 10.5 %
Neutro Abs: 6728 cells/uL (ref 1500–7800)
Neutrophils Relative %: 75.6 %
Platelets: 386 10*3/uL (ref 140–400)
RBC: 5.26 10*6/uL (ref 4.20–5.80)
RDW: 13.6 % (ref 11.0–15.0)
Total Lymphocyte: 13.1 %
WBC: 8.9 10*3/uL (ref 3.8–10.8)

## 2020-07-17 LAB — LIPID PANEL
Cholesterol: 149 mg/dL (ref <200)
HDL: 30 mg/dL — ABNORMAL LOW (ref >=40)
LDL Cholesterol (Calc): 91 mg/dL (calc)
Non-HDL Cholesterol (Calc): 119 mg/dL (calc) (ref <130)
Total CHOL/HDL Ratio: 5 (calc) — ABNORMAL HIGH (ref <5.0)
Triglycerides: 188 mg/dL — ABNORMAL HIGH (ref <150)

## 2020-07-17 LAB — LIPASE, BLOOD: Lipase: 90 U/L — ABNORMAL HIGH (ref 11–51)

## 2020-07-17 LAB — TSH: TSH: 1.71 mIU/L (ref 0.40–4.50)

## 2020-07-17 MED ORDER — PANTOPRAZOLE SODIUM 40 MG IV SOLR
40.0000 mg | INTRAVENOUS | Status: DC
  Administered 2020-07-18 (×2): 40 mg via INTRAVENOUS
  Filled 2020-07-17 (×2): qty 40

## 2020-07-17 MED ORDER — ROSUVASTATIN CALCIUM 20 MG PO TABS
20.0000 mg | ORAL_TABLET | Freq: Every day | ORAL | Status: DC
  Administered 2020-07-18 – 2020-07-19 (×3): 20 mg via ORAL
  Filled 2020-07-17 (×3): qty 2
  Filled 2020-07-17 (×3): qty 1

## 2020-07-17 MED ORDER — LACTATED RINGERS IV BOLUS
1000.0000 mL | Freq: Once | INTRAVENOUS | Status: AC
  Administered 2020-07-17: 1000 mL via INTRAVENOUS

## 2020-07-17 MED ORDER — IOHEXOL 9 MG/ML PO SOLN: 500.0000 mL | ORAL | Status: AC

## 2020-07-17 MED ORDER — HEPARIN SODIUM (PORCINE) 5000 UNIT/ML IJ SOLN
5000.0000 [IU] | Freq: Three times a day (TID) | INTRAMUSCULAR | Status: DC
  Administered 2020-07-18 – 2020-07-19 (×5): 5000 [IU] via SUBCUTANEOUS
  Filled 2020-07-17 (×5): qty 1

## 2020-07-17 MED ORDER — ASPIRIN EC 81 MG PO TBEC
81.0000 mg | DELAYED_RELEASE_TABLET | Freq: Every day | ORAL | Status: DC
  Administered 2020-07-18 – 2020-07-19 (×2): 81 mg via ORAL
  Filled 2020-07-17 (×2): qty 1

## 2020-07-17 MED ORDER — THIAMINE HCL 100 MG PO TABS
100.0000 mg | ORAL_TABLET | Freq: Every day | ORAL | Status: DC
  Administered 2020-07-18 – 2020-07-19 (×3): 100 mg via ORAL
  Filled 2020-07-17 (×3): qty 1

## 2020-07-17 MED ORDER — PROMETHAZINE HCL 25 MG PO TABS
12.5000 mg | ORAL_TABLET | Freq: Four times a day (QID) | ORAL | Status: DC | PRN
  Filled 2020-07-17: qty 1

## 2020-07-17 NOTE — Telephone Encounter (Signed)
Copied from [place] [id number]. Topic: General - Other >> Jul 17, 2020  4:32 PM Wynetta Emery, [place] C wrote: Reason for CRM: pt's spouse called in to follow up on pt's lab results.   Please assist [phone]

## 2020-07-17 NOTE — ED Notes (Addendum)
Pt ambulatory from waiting room to ED bed 18H with steady gait. Dr. Charna Archer at bedside

## 2020-07-17 NOTE — ED Notes (Signed)
hospitalist at bedside

## 2020-07-17 NOTE — ED Notes (Signed)
Patient to ultrasound with technician at this time

## 2020-07-17 NOTE — ED Triage Notes (Signed)
Pt comes into the ED via POV c/o dehydration.  PT has had N/V/D x 2 weeks.  Pt was seen by his PCP Tuesday where they found his kidney functions to have a BUN of 66, creat 2.7, sodium 128.  Pt admits to increased weakness.  Pt denies any abd pain, CP, SHOB.  Pt was COVID negative 2 weeks ago.  Pt ambulatory to triage with even and unlabored respirations.

## 2020-07-17 NOTE — ED Notes (Signed)
Patient denies pain and is resting comfortably.  

## 2020-07-17 NOTE — H&P (Signed)
History and Physical    Charles Valentine MRN:[id number] DOB: 05/04/1955 DOA: 07/17/2020  PCP: Sowles, Krichna, MD    Patient coming from:  Home  Chief Complaint:  N/V/D x 2 weeks.   HPI: Charles Valentine is a 64 y.o. male seen in ed with complaints of nausea and diarrhea x2 weeks.  Patient came in private vehicle lanes of dehydration not being able to keep anything down.  Reports that he did see his primary care and blood work showed that his kidney function was abnormal which was 2.7.  Diarrhea has been intermittent for the past 2 weeks and watery with at least 2-3 bowel movements per day.  Patient does report feeling nauseated and poor appetite not being able to keep stuff down.  Patient has not had any rashes or melena hematochezia hematemesis dysuria abdominal pain.  Review systems also negative for any chest pain headaches dizziness blurred vision lower extremity edema, urinary issues, chest pain.  Pt has past medical history of erectile dysfunction, hypertension, hyperlipidemia.  ED Course:  Vitals:   07/17/20 1839 07/17/20 1840 07/17/20 2002  BP: 97/75  110/61  Pulse: 88  70  Resp: 16  16  Temp: 97.8 F (36.6 C)    TempSrc: Oral    SpO2: 99%  99%  Weight:  78 kg   Height:  6' (1.829 m)   In the emergency room patient is alert awake afebrile, blood pressure is low normal systolics of 97/75 initially, blood work shows hyponatremia, normal potassium of 4.4, acute kidney injury with a serum creatinine of 3.0 today with EGFR of, anion gap of 14.: Lab Results  Component Value Date   CREATININE 3.00 (H) 07/17/2020   CREATININE 2.72 (H) 07/16/2020   CREATININE 1.02 07/25/2019  Lipase of 90, normal LFTs, CBC shows normal WBC count of 10.5, hemoglobin 15.2, platelets of 394.  TSH is 1.71.     Review of Systems:  Review of Systems  Gastrointestinal: Positive for diarrhea, nausea and vomiting.  All other systems reviewed and are negative.    Past Medical History:   Diagnosis Date  . AKI (acute kidney injury) (HCC) 07/17/2020  . ED (erectile dysfunction)   . History of colon polyps   . Hyperlipidemia   . Hypertension   . Insomnia     History reviewed. No pertinent surgical history.   reports that he quit smoking about 33 years ago. His smoking use included cigarettes. He has a 5.00 pack-year smoking history. He has never used smokeless tobacco. He reports current alcohol use of about 8.0 standard drinks of alcohol per week. He reports that he does not use drugs.  No Known Allergies  Family History  Problem Relation Age of Onset  . Hypertension Mother   . Diabetes Mother   . Heart disease Mother 65       CABG  . Heart attack Mother 60  . Obesity Sister   . Drug abuse Brother     Prior to Admission medications   Medication Sig Start Date End Date Taking? Authorizing Provider  aspirin EC 81 MG tablet Take 1 tablet (81 mg total) by mouth daily. 04/08/16   Sowles, Krichna, MD  Multiple Vitamins-Minerals (MULTIVITAMIN) tablet Take 1 tablet by mouth daily. Patient not taking: No sig reported 04/08/16   Sowles, Krichna, MD  Nebivolol HCl 20 MG TABS Take 1 tablet (20 mg total) by mouth daily. In place of Exforge 01/24/20   Sowles, Krichna, MD  olmesartan-hydrochlorothiazide (BENICAR HCT) 40-12.5 MG tablet TAKE   1 TABLET BY MOUTH DAILY 04/25/20   Steele Sizer, MD  rosuvastatin (CRESTOR) 40 MG tablet TAKE 1 TABLET(40 MG) BY MOUTH DAILY 04/25/20   Steele Sizer, MD  tadalafil (CIALIS) 20 MG tablet Take 1 tablet (20 mg total) by mouth daily as needed for erectile dysfunction. 07/28/19   Steele Sizer, MD  traZODone (DESYREL) 100 MG tablet Take 1 tablet (100 mg total) by mouth at bedtime as needed for sleep. 07/25/19   Steele Sizer, MD    Physical Exam: Vitals:   07/17/20 1839 07/17/20 1840 07/17/20 2002  BP: 97/75  110/61  Pulse: 88  70  Resp: 16  16  Temp: 97.8 F (36.6 C)    TempSrc: Oral    SpO2: 99%  99%  Weight:  78 kg   Height:  6'  (1.829 m)    Physical Exam Vitals and nursing note reviewed.  Constitutional:      Appearance: He is not ill-appearing.  HENT:     Head: Normocephalic and atraumatic.     Right Ear: External ear normal.     Left Ear: External ear normal.     Mouth/Throat:     Mouth: Mucous membranes are moist.  Eyes:     Extraocular Movements: Extraocular movements intact.  Cardiovascular:     Rate and Rhythm: Normal rate and regular rhythm.     Pulses: Normal pulses.     Heart sounds: Normal heart sounds.  Pulmonary:     Effort: Pulmonary effort is normal.     Breath sounds: Normal breath sounds.  Abdominal:     General: Bowel sounds are normal. There is no distension.     Palpations: Abdomen is soft.     Tenderness: There is no abdominal tenderness. There is no guarding.  Musculoskeletal:     Right lower leg: No edema.     Left lower leg: No edema.  Skin:    General: Skin is warm.     Findings: No rash.  Neurological:     General: No focal deficit present.     Mental Status: He is alert and oriented to person, place, and time.  Psychiatric:        Mood and Affect: Mood normal.        Behavior: Behavior normal.    Labs on Admission: I have personally reviewed following labs and imaging studies  No results for input(s): CKTOTAL, CKMB, TROPONINI in the last 72 hours. Lab Results  Component Value Date   WBC 10.5 07/17/2020   HGB 15.2 07/17/2020   HCT 44.3 07/17/2020   MCV 85.0 07/17/2020   PLT 394 07/17/2020    Recent Labs  Lab 07/17/20 1841  NA 130*  K 4.4  CL 102  CO2 14*  BUN 64*  CREATININE 3.00*  CALCIUM 9.1  PROT 8.0  BILITOT 0.9  ALKPHOS 63  ALT 41  AST 20  GLUCOSE 109*   Lab Results  Component Value Date   CHOL 149 07/16/2020   HDL 30 (L) 07/16/2020   LDLCALC 91 07/16/2020   TRIG 188 (H) 07/16/2020   No results found for: DDIMER Invalid input(s): POCBNP  Urinalysis    Component Value Date/Time   BILIRUBINUR Negative 07/16/2020 1015   PROTEINUR  Negative 07/16/2020 1015   UROBILINOGEN 0.2 07/16/2020 1015   NITRITE Negative 07/16/2020 1015   LEUKOCYTESUR Negative 07/16/2020 1015    Radiological Exams on Admission: CT abd/pelvis  ordered. BL renal usg ordered.  EKG: None   Assessment/Plan Principal Problem:  Nausea vomiting and diarrhea Active Problems:   AKI (acute kidney injury) ([place])   Hypotension   Nausea vomiting diarrhea:  Differentials include infectious versus viral. Supportive care with clear liquid diet as tolerated, antidiarrheal, IV PPI. As needed Zofran.  CT of the abdomen and pelvis noncontrast is pending. Patient lipase is mildly elevated initial stages of pancreatitis.   Acute kidney injury: Etiologies include renal and prerenal. We will repeat another 1 L LR bolus. renally dose all needed meds and avoid contrast studies. Bilateral renal ultrasound.   Hypotension: Attribute to patient's and nausea and vomiting. Continue with IV fluid hydration with normal saline 75. Hold home blood pressure management medications.    DVT prophylaxis:  Heparin  Code Status:  Full Code  Family Communication:  Lathen, Seal (Spouse)  [phone] (Mobile)  Disposition Plan:  Home  Consults called:  None  Admission status: Inpatient.    Para Skeans MD [facility] [phone] How to contact the [facility] Attending or Consulting provider [place] or covering provider during after hours [place], for this patient.    1. Check the care team in [facility] and look for a) attending/consulting [place] provider listed and b) the [facility] team listed 2. Log into [email or web address] and use [place] universal password to access. If you do not have the password, please contact the hospital operator. 3. Locate the [facility] provider you are looking for under [facility] and page to a number that you can be directly reached. 4. If you still have difficulty reaching the provider, please page the [facility] (Director on Call)  for the Hospitalists listed on amion for assistance. [email or web address] Password [email or web address] 07/17/2020, 9:00 PM

## 2020-07-17 NOTE — ED Provider Notes (Signed)
[facility] Emergency Department Provider Note   ____________________________________________   Event Date/Time   First MD Initiated Contact with Patient 07/17/20 1934     (approximate)  I have reviewed the triage vital signs and the nursing notes.   HISTORY  Chief Complaint Dehydration    HPI Charles Valentine is a 65 y.o. male with past medical history of hypertension, hyperlipidemia who presents to the ED complaining of dehydration.  Patient reports that he has been dealing with intermittent diarrhea for the past couple of weeks with at least 2 watery bowel movements per day.  He has been feeling nauseous occasionally and reports a poor appetite while the symptoms have been ongoing.  He has not noticed any blood in his stool and denies any abdominal pain.  He has not had any fevers, flank pain, dysuria, or hematuria.  He was seen by his PCP earlier in the week, who was concerned about his kidney function and recommended he come to the ED for IV fluids.        Past Medical History:  Diagnosis Date  . ED (erectile dysfunction)   . History of colon polyps   . Hyperlipidemia   . Hypertension   . Insomnia     Patient Active Problem List   Diagnosis Date Noted  . Sinus tachycardia 09/02/2016  . Essential hypertension 09/02/2016  . Eczema 06/08/2016  . Hyperlipidemia LDL goal <130 04/08/2016  . ED (erectile dysfunction)     History reviewed. No pertinent surgical history.  Prior to Admission medications   Medication Sig Start Date End Date Taking? Authorizing Provider  aspirin EC 81 MG tablet Take 1 tablet (81 mg total) by mouth daily. 04/08/16   Steele Sizer, MD  Multiple Vitamins-Minerals (MULTIVITAMIN) tablet Take 1 tablet by mouth daily. Patient not taking: No sig reported 04/08/16   Steele Sizer, MD  Nebivolol HCl 20 MG TABS Take 1 tablet (20 mg total) by mouth daily. In place of Exforge 01/24/20   Steele Sizer, MD   olmesartan-hydrochlorothiazide [facility] HCT) 40-12.5 MG tablet TAKE 1 TABLET BY MOUTH DAILY 04/25/20   Steele Sizer, MD  rosuvastatin (CRESTOR) 40 MG tablet TAKE 1 TABLET(40 MG) BY MOUTH DAILY 04/25/20   Steele Sizer, MD  tadalafil (CIALIS) 20 MG tablet Take 1 tablet (20 mg total) by mouth daily as needed for erectile dysfunction. 07/28/19   Steele Sizer, MD  traZODone (DESYREL) 100 MG tablet Take 1 tablet (100 mg total) by mouth at bedtime as needed for sleep. 07/25/19   Steele Sizer, MD    Allergies Patient has no known allergies.  Family History  Problem Relation Age of Onset  . Hypertension Mother   . Diabetes Mother   . Heart disease Mother 37       CABG  . Heart attack Mother 53  . Obesity Sister   . Drug abuse Brother     Social History Social History   Tobacco Use  . Smoking status: Former Smoker    Packs/day: 0.50    Years: 10.00    Pack years: 5.00    Types: Cigarettes    Quit date: 1989    Years since quitting: 33.2  . Smokeless tobacco: Never Used  Vaping Use  . Vaping Use: Never used  Substance Use Topics  . Alcohol use: Yes    Alcohol/week: 8.0 standard drinks    Types: 6 Cans of beer, 2 Shots of liquor per week  . Drug use: No    Review  of Systems  Constitutional: No fever/chills Eyes: No visual changes. ENT: No sore throat. Cardiovascular: Denies chest pain. Respiratory: Denies shortness of breath. Gastrointestinal: No abdominal pain.  Positive for nausea, vomiting, and diarrhea.  No constipation. Genitourinary: Negative for dysuria. Musculoskeletal: Negative for back pain. Skin: Negative for rash. Neurological: Negative for headaches, focal weakness or numbness.  ____________________________________________   PHYSICAL EXAM:  VITAL SIGNS: ED Triage Vitals  Enc Vitals Group     BP 07/17/20 1839 97/75     Pulse Rate 07/17/20 1839 88     Resp 07/17/20 1839 16     Temp 07/17/20 1839 97.8 F (36.6 C)     Temp Source 07/17/20 1839  Oral     SpO2 07/17/20 1839 99 %     Weight 07/17/20 1840 172 lb (78 kg)     Height 07/17/20 1840 6' (1.829 m)     Head Circumference --      Peak Flow --      Pain Score 07/17/20 1840 0     Pain Loc --      Pain Edu? --      Excl. in [place]? --     Constitutional: Alert and oriented. Eyes: Conjunctivae are normal. Head: Atraumatic. Nose: No congestion/rhinnorhea. Mouth/Throat: Mucous membranes are dry. Neck: Normal ROM Cardiovascular: Normal rate, regular rhythm. Grossly normal heart sounds. Respiratory: Normal respiratory effort.  No retractions. Lungs CTAB. Gastrointestinal: Soft and nontender. No distention. Genitourinary: deferred Musculoskeletal: No lower extremity tenderness nor edema. Neurologic:  Normal speech and language. No gross focal neurologic deficits are appreciated. Skin:  Skin is warm, dry and intact. No rash noted. Psychiatric: Mood and affect are normal. Speech and behavior are normal.  ____________________________________________   LABS (all labs ordered are listed, but only abnormal results are displayed)  Labs Reviewed  LIPASE, BLOOD - Abnormal; Notable for the following components:      Result Value   Lipase 90 (*)    All other components within normal limits  COMPREHENSIVE METABOLIC PANEL - Abnormal; Notable for the following components:   Sodium 130 (*)    CO2 14 (*)    Glucose, Bld 109 (*)    BUN 64 (*)    Creatinine, Ser 3.00 (*)    GFR, Estimated 23 (*)    All other components within normal limits  SARS CORONAVIRUS 2 (TAT 6-24 HRS)  CBC  URINALYSIS, COMPLETE (UACMP) WITH MICROSCOPIC    PROCEDURES  Procedure(s) performed (including Critical Care):  Procedures   ____________________________________________   INITIAL IMPRESSION / ASSESSMENT AND PLAN / ED COURSE       65 year old male with past medical history of hypertension and hyperlipidemia who presents to the ED complaining of diarrhea and poor p.o. intake going on for the  past 2 weeks.  Lab work at his PCPs office yesterday was concerning for AKI with mild hyponatremia.  Labs again today demonstrate AKI that is slightly worse from yesterday.  This is likely due to dehydration and we will initiate IV fluids.  He has no abdominal tenderness whatsoever and potassium is within normal limits.  We will perform testing for COVID-19 and case discussed with hospitalist for admission.      ____________________________________________   FINAL CLINICAL IMPRESSION(S) / ED DIAGNOSES  Final diagnoses:  AKI (acute kidney injury) ([place])  Diarrhea, unspecified type     ED Discharge Orders    None       Note:  This document was prepared using Dragon voice recognition software and  may include unintentional dictation errors.   Blake Divine, MD 07/17/20 [phone]

## 2020-07-18 DIAGNOSIS — R197 Diarrhea, unspecified: Secondary | ICD-10-CM

## 2020-07-18 DIAGNOSIS — R112 Nausea with vomiting, unspecified: Secondary | ICD-10-CM

## 2020-07-18 LAB — GASTROINTESTINAL PANEL BY PCR, STOOL (REPLACES STOOL CULTURE)

## 2020-07-18 LAB — PHOSPHORUS: Phosphorus: 3.8 mg/dL (ref 2.5–4.6)

## 2020-07-18 LAB — URINALYSIS, COMPLETE (UACMP) WITH MICROSCOPIC
Bacteria, UA: NONE SEEN
Bilirubin Urine: NEGATIVE
Glucose, UA: NEGATIVE mg/dL
Hgb urine dipstick: NEGATIVE
Ketones, ur: 5 mg/dL — AB
Leukocytes,Ua: NEGATIVE
Nitrite: NEGATIVE
Protein, ur: NEGATIVE mg/dL
Specific Gravity, Urine: 1.009 (ref 1.005–1.030)
pH: 5 (ref 5.0–8.0)

## 2020-07-18 LAB — MAGNESIUM: Magnesium: 1.9 mg/dL (ref 1.7–2.4)

## 2020-07-18 LAB — BASIC METABOLIC PANEL
Anion gap: 11 (ref 5–15)
BUN: 50 mg/dL — ABNORMAL HIGH (ref 8–23)
CO2: 15 mmol/L — ABNORMAL LOW (ref 22–32)
Calcium: 9.2 mg/dL (ref 8.9–10.3)
Chloride: 105 mmol/L (ref 98–111)
Creatinine, Ser: 2.04 mg/dL — ABNORMAL HIGH (ref 0.61–1.24)
GFR, Estimated: 36 mL/min — ABNORMAL LOW (ref >60)
Glucose, Bld: 101 mg/dL — ABNORMAL HIGH (ref 70–99)
Potassium: 4.5 mmol/L (ref 3.5–5.1)
Sodium: 131 mmol/L — ABNORMAL LOW (ref 135–145)

## 2020-07-18 LAB — URINE DRUG SCREEN, QUALITATIVE (ARMC ONLY)
Amphetamines, Ur Screen: NOT DETECTED
Barbiturates, Ur Screen: NOT DETECTED
Benzodiazepine, Ur Scrn: NOT DETECTED
Cannabinoid 50 Ng, Ur ~~LOC~~: NOT DETECTED
Cocaine Metabolite,Ur ~~LOC~~: NOT DETECTED
MDMA (Ecstasy)Ur Screen: NOT DETECTED
Methadone Scn, Ur: NOT DETECTED
Opiate, Ur Screen: NOT DETECTED
Phencyclidine (PCP) Ur S: NOT DETECTED
Tricyclic, Ur Screen: NOT DETECTED

## 2020-07-18 LAB — CBC
HCT: 38.9 % — ABNORMAL LOW (ref 39.0–52.0)
Hemoglobin: 13.6 g/dL (ref 13.0–17.0)
MCH: 29.4 pg (ref 26.0–34.0)
MCHC: 35 g/dL (ref 30.0–36.0)
MCV: 84.2 fL (ref 80.0–100.0)
Platelets: 330 10*3/uL (ref 150–400)
RBC: 4.62 MIL/uL (ref 4.22–5.81)
RDW: 13.2 % (ref 11.5–15.5)
WBC: 6.7 10*3/uL (ref 4.0–10.5)
nRBC: 0 % (ref 0.0–0.2)

## 2020-07-18 LAB — SARS CORONAVIRUS 2 (TAT 6-24 HRS): SARS Coronavirus 2: NEGATIVE

## 2020-07-18 LAB — OSMOLALITY: Osmolality: 289 mOsm/kg (ref 275–295)

## 2020-07-18 LAB — HIV ANTIBODY (ROUTINE TESTING W REFLEX): HIV Screen 4th Generation wRfx: NONREACTIVE

## 2020-07-18 LAB — ETHANOL: Alcohol, Ethyl (B): <10 mg/dL (ref <10)

## 2020-07-18 LAB — LIPASE, BLOOD: Lipase: 80 U/L — ABNORMAL HIGH (ref 11–51)

## 2020-07-18 MED ORDER — ADULT MULTIVITAMIN W/MINERALS CH
1.0000 | ORAL_TABLET | Freq: Every day | ORAL | Status: DC
  Administered 2020-07-18 – 2020-07-19 (×2): 1 via ORAL
  Filled 2020-07-18 (×2): qty 1

## 2020-07-18 MED ORDER — SODIUM CHLORIDE 0.9 % IV SOLN: INTRAVENOUS | Status: DC

## 2020-07-18 MED ORDER — BOOST / RESOURCE BREEZE PO LIQD CUSTOM
1.0000 | Freq: Three times a day (TID) | ORAL | Status: DC
  Administered 2020-07-18: 1 via ORAL

## 2020-07-18 MED ORDER — ENSURE ENLIVE PO LIQD: 237.0000 mL | Freq: Three times a day (TID) | ORAL | Status: DC

## 2020-07-18 MED ORDER — HYDRALAZINE HCL 20 MG/ML IJ SOLN: 10.0000 mg | Freq: Four times a day (QID) | INTRAMUSCULAR | Status: DC | PRN

## 2020-07-18 MED ORDER — PROSOURCE PLUS PO LIQD
30.0000 mL | Freq: Three times a day (TID) | ORAL | Status: DC
  Administered 2020-07-18: 30 mL via ORAL
  Filled 2020-07-18 (×2): qty 30

## 2020-07-18 NOTE — Progress Notes (Signed)
[facility] Progress Note  Patient: Charles Valentine    HQP:[id number]  DOA: 07/17/2020     Date of Service: the patient was seen and examined on 07/18/2020  Chief Complaint  Patient presents with  . Dehydration   Brief hospital course: Charles Valentine is a 65 y.o. male seen in ed with complaints of nausea and diarrhea x2 weeks.  Patient came in private vehicle lanes of dehydration not being able to keep anything down.  Reports that he did see his primary care and blood work showed that his kidney function was abnormal which was 2.7.  Diarrhea has been intermittent for the past 2 weeks and watery with at least 2-3 bowel movements per day.  Patient does report feeling nauseated and poor appetite not being able to keep stuff down.  Patient has not had any rashes or melena hematochezia hematemesis dysuria abdominal pain.  Review systems also negative for any chest pain headaches dizziness blurred vision lower extremity edema, urinary issues, chest pain.  Pt has past medical history of erectile dysfunction, hypertension, hyperlipidemia.   Assessment and Plan: Principal Problem:   Nausea vomiting and diarrhea Active Problems:   AKI (acute kidney injury) ([place])   Hypotension   Pancreatitis, presented with vomiting 2 days ago and diarrhea off and on for 2 weeks GI pathogen negative Advance diet to full liquid   IV PPI. As needed Zofran.  CT of the abdomen and pelvis noncontrast did not show any acute pathology, right renal cyst. Patient lipase is mildly elevated initial stages of pancreatitis. Lipase 9 0--80 0 gradually trending down Continue IV fluid for hydration   Acute kidney injury most likely prerenal due to dehydration S/p IV Ringer lactate bolus given Continue normal saline IV infusion Etiologies include renal and prerenal. Creatinine 2.7--3.0--2.0 gradually improving renally dose all needed meds and avoid contrast studies. Bilateral renal ultrasound showed right  complex renal cyst, patient will need renal MRI as an outpatient   Hypotension: Attribute to patient's and nausea and vomiting. Continue with IV fluid hydration with normal saline 75. Hold home blood pressure management medications.   Body mass index is 23.5 kg/m.   Interventions:      Diet: Full liquid diet DVT Prophylaxis: Subcutaneous Heparin    Advance goals of care discussion: Full code  Family Communication: family was present at bedside, at the time of interview.  The pt provided permission to discuss medical plan with the family. Opportunity was given to ask question and all questions were answered satisfactorily.   Disposition:  Pt is from Home, admitted with AKI secondary to dehydration due to NVD, still has AKI, which precludes a safe discharge. Discharge to Home, when renal function will improve.  Subjective: Patient was admitted overnight due to diarrhea off and on for 2 weeks and vomited 2 days ago.  Patient was found to have AKI.  Patient denies any active issues at this time, started clear liquid diet in the morning time Pain is under control, we will continue to monitor today.   Physical Exam: General:  alert oriented to time, place, and person.  Appear in no distress, affect appropriate Eyes: PERRLA ENT: Oral Mucosa Clear, dry  Neck: no JVD,  Cardiovascular: S1 and S2 Present, no Murmur,  Respiratory: good respiratory effort, Bilateral Air entry equal and Decreased, no Crackles, no wheezes Abdomen: Bowel Sound present, Soft and no tenderness,  Skin: no rashes Extremities: no Pedal edema, no calf tenderness Neurologic: without any new focal findings Gait not checked due  to patient safety concerns  Vitals:   07/17/20 2311 07/18/20 0457 07/18/20 0503 07/18/20 0903  BP: (!) 142/93 118/71 118/71 105/77  Pulse: 89 79 79 78  Resp: 18 16 16 16   Temp: 97.7 F (36.5 C) 98 F (36.7 C) 98 F (36.7 C) 98 F (36.7 C)  TempSrc: Oral Oral Oral   SpO2: 96%  99% 99% 100%  Weight:   78.6 kg   Height:   6' (1.829 m)     Intake/Output Summary (Last 24 hours) at 07/18/2020 1441 Last data filed at 07/17/2020 2246 Gross per 24 hour  Intake 1000 ml  Output --  Net 1000 ml   Filed Weights   07/17/20 1840 07/18/20 0503  Weight: 78 kg 78.6 kg    Data Reviewed: I have personally reviewed and interpreted daily labs, tele strips, imagings as discussed above. I reviewed all nursing notes, pharmacy notes, vitals, pertinent old records I have discussed plan of care as described above with RN and patient/family.  CBC: Recent Labs  Lab 07/16/20 1030 07/17/20 1841 07/18/20 0907  WBC 8.9 10.5 6.7  NEUTROABS 6,728  --   --   HGB 15.4 15.2 13.6  HCT 46.0 44.3 38.9*  MCV 87.5 85.0 84.2  PLT 386 394 893   Basic Metabolic Panel: Recent Labs  Lab 07/16/20 1030 07/17/20 1841 07/18/20 0907  NA 128* 130* 131*  K 5.4* 4.4 4.5  CL 99 102 105  CO2 16* 14* 15*  GLUCOSE 98 109* 101*  BUN 56* 64* 50*  CREATININE 2.72* 3.00* 2.04*  CALCIUM 9.7 9.1 9.2  MG  --   --  1.9  PHOS  --   --  3.8    Studies: CT ABDOMEN PELVIS WO CONTRAST  Result Date: 07/17/2020 CLINICAL DATA:  dehydration. PT has had N/V/D x 2 weeks. EXAM: CT ABDOMEN AND PELVIS WITHOUT CONTRAST TECHNIQUE: Multidetector CT imaging of the abdomen and pelvis was performed following the standard protocol without IV contrast. COMPARISON:  US renal 07/17/2020 FINDINGS: Lower chest: No acute abnormality. Hepatobiliary: No focal liver abnormality. No gallstones, gallbladder wall thickening, or pericholecystic fluid. No biliary dilatation. Pancreas: No focal lesion. Normal pancreatic contour. No surrounding inflammatory changes. No main pancreatic ductal dilatation. Hip Normal in size without focal abnormality. Spleen: Normal in size without focal abnormality. Adrenals/Urinary Tract: No adrenal nodule bilaterally. 2.2 cm fluid density lesion within the right kidney better evaluated on ultrasound  07/17/2020. No nephrolithiasis, no hydronephrosis, and otherwise no contour-deforming renal mass. No ureterolithiasis or hydroureter. The urinary bladder is unremarkable. Stomach/Bowel: PO contrast reaches the rectum. Stomach is within normal limits. No evidence of bowel wall thickening or dilatation. Scattered sigmoid diverticulosis. Appendix appears normal. Vascular/Lymphatic: No abdominal aorta or iliac aneurysm. Mild-to-moderate atherosclerotic plaque of the aorta and its branches. No abdominal, pelvic, or inguinal lymphadenopathy. Reproductive: Prominent prostate appear Other: No intraperitoneal free fluid. No intraperitoneal free gas. No organized fluid collection. Musculoskeletal: No abdominal wall hernia or abnormality. No suspicious lytic or blastic osseous lesions. No acute displaced fracture. Multilevel degenerative changes of the spine. IMPRESSION: 1. A 2.2 cm fluid density lesion within the right kidney is better evaluated on ultrasound 07/17/2020. 2. Scattered sigmoid diverticulosis with no acute diverticulitis. Electronically Signed   By: Iven Finn M.D.   On: 07/17/2020 23:21   US RENAL  Result Date: 07/17/2020 CLINICAL DATA:  Acute renal insufficiency, diarrhea EXAM: RENAL / URINARY TRACT ULTRASOUND COMPLETE COMPARISON:  None. FINDINGS: Right Kidney: Renal measurements: 11.5 x 4.8 by 5.5  cm = volume: 159.6 mL. Echogenicity within normal limits. There is a complex peripelvic cyst measuring 1.6 x 1.9 x 1.7 cm, with multiple internal septations. No hydronephrosis. Left Kidney: Renal measurements: 11.4 x 6.2 x 6.1 cm = volume: 225.9 mL. Echogenicity within normal limits. No mass or hydronephrosis visualized. Bladder: Appears normal for degree of bladder distention. Other: None. IMPRESSION: 1. 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 Further evaluation with nonemergent outpatient dedicated renal MRI recommended. 2. Otherwise unremarkable renal ultrasound. Electronically Signed    By: Randa Ngo M.D.   On: 07/17/2020 21:21    Scheduled Meds: . (feeding supplement) PROSource Plus  30 mL Oral TID BM  . aspirin EC  81 mg Oral Daily  . feeding supplement  1 Container Oral TID BM  . heparin  5,000 Units Subcutaneous Q8H  . multivitamin with minerals  1 tablet Oral Daily  . pantoprazole (PROTONIX) IV  40 mg Intravenous Q24H  . rosuvastatin  20 mg Oral Daily  . thiamine  100 mg Oral Daily   Continuous Infusions: . sodium chloride 100 mL/hr at 07/18/20 0924   PRN Meds: hydrALAZINE, promethazine  Time spent: 35 minutes  Author: Val Riles. MD [facility] 07/18/2020 2:41 PM  To reach On-call, see care teams to locate the attending and reach out to them via [email or web address]. If 7PM-7AM, please contact night-coverage If you still have difficulty reaching the attending provider, please page the [facility] (Director on Call) for [facility] on amion for assistance.

## 2020-07-18 NOTE — Progress Notes (Signed)
Initial Nutrition Assessment  DOCUMENTATION CODES:   Not applicable  INTERVENTION:   -D/c Boost Breeze po TID, each supplement provides 250 kcal and 9 grams of protein -Ensure Enlive po TID, each supplement provides 350 kcal and 20 grams of protein -MVI with minerals daily  NUTRITION DIAGNOSIS:   Inadequate oral intake related to altered GI function as evidenced by per patient/family report.  GOAL:   Patient will meet greater than or equal to 90% of their needs  MONITOR:   PO intake,Supplement acceptance,Diet advancement,Labs,Weight trends,Skin,I & O's  REASON FOR ASSESSMENT:   Malnutrition Screening Tool    ASSESSMENT:   Charles Valentine is a 65 y.o. male seen in ed with complaints of nausea and diarrhea x2 weeks.  Patient came in private vehicle lanes of dehydration not being able to keep anything down  Pt admitted with nausea, vomiting, diarrhea, and acute kidney injury.   Reviewed I/O's: +1 L x 24 hours  Pt unavailable at time of visit. Attempted to speak with pt via phone call to hospital room, however, unable to reach.   Pt just advanced to a full liquid diet. No meal completions available to assess at this time.   Reviewed wt hx; pt has experienced a 7% wt loss over the past 6 months, which is not significant for time frame.   Pt would benefit from addition of oral nutrition supplements.   Medications reviewed and include 0.9% sodium chloride @ 100 ml/hr.   Labs reviewed: Na: 131.   Diet Order:   Diet Order            Diet full liquid Room service appropriate? Yes; Fluid consistency: Thin  Diet effective now                 EDUCATION NEEDS:   No education needs have been identified at this time  Skin:  Skin Assessment: Reviewed RN Assessment  Last BM:  07/18/20  Height:   Ht Readings from Last 1 Encounters:  07/18/20 6' (1.829 m)    Weight:   Wt Readings from Last 1 Encounters:  07/18/20 78.6 kg    Ideal Body Weight:  80.9  kg  BMI:  Body mass index is 23.5 kg/m.  Estimated Nutritional Needs:   Kcal:  2150-2350  Protein:  105-120 grams  Fluid:  > 2 L    Loistine Chance, RD, LDN, [place] Registered Dietitian II Certified Diabetes Care and Education Specialist Please refer to [facility] for RD and/or RD on-call/weekend/after hours pager

## 2020-07-18 NOTE — Plan of Care (Deleted)
  Problem: Education: Goal: Knowledge of General Education information will improve Description Including pain rating scale, medication(s)/side effects and non-pharmacologic comfort measures Outcome: Progressing   

## 2020-07-18 NOTE — Plan of Care (Signed)
  Problem: Education: Goal: Knowledge of General Education information will improve Description: Including pain rating scale, medication(s)/side effects and non-pharmacologic comfort measures 07/18/2020 1152 by Vala Raffo J, LPN Outcome: Progressing 07/18/2020 1150 by Laney Potash J, LPN Outcome: Progressing   Problem: Health Behavior/Discharge Planning: Goal: Ability to manage health-related needs will improve 07/18/2020 1152 by Zyair Rhein J, LPN Outcome: Progressing 07/18/2020 1150 by Laney Potash J, LPN Outcome: Progressing   Problem: Clinical Measurements: Goal: Ability to maintain clinical measurements within normal limits will improve 07/18/2020 1152 by Zahniya Zellars J, LPN Outcome: Progressing 07/18/2020 1150 by Laney Potash J, LPN Outcome: Progressing Goal: Will remain free from infection 07/18/2020 1152 by Yolando Gillum J, LPN Outcome: Progressing 07/18/2020 1150 by Laney Potash J, LPN Outcome: Progressing Goal: Diagnostic test results will improve 07/18/2020 1152 by Jahsir Rama J, LPN Outcome: Progressing 07/18/2020 1150 by Laney Potash J, LPN Outcome: Progressing Goal: Respiratory complications will improve 07/18/2020 1152 by Laney Potash J, LPN Outcome: Progressing 07/18/2020 1150 by Kerney Elbe, LPN Outcome: Progressing Goal: Cardiovascular complication will be avoided 07/18/2020 1152 by Shahira Fiske J, LPN Outcome: Progressing 07/18/2020 1150 by Kerney Elbe, LPN Outcome: Progressing   Problem: Activity: Goal: Risk for activity intolerance will decrease 07/18/2020 1152 by Keefe Zawistowski J, LPN Outcome: Progressing 07/18/2020 1150 by Kerney Elbe, LPN Outcome: Progressing   Problem: Nutrition: Goal: Adequate nutrition will be maintained 07/18/2020 1152 by Kerney Elbe, LPN Outcome: Progressing 07/18/2020 1150 by Kerney Elbe, LPN Outcome: Progressing   Problem: Coping: Goal: Level  of anxiety will decrease 07/18/2020 1152 by Alexxander Kurt J, LPN Outcome: Progressing 07/18/2020 1150 by Kerney Elbe, LPN Outcome: Progressing   Problem: Elimination: Goal: Will not experience complications related to bowel motility 07/18/2020 1152 by Kerney Elbe, LPN Outcome: Progressing 07/18/2020 1150 by Kerney Elbe, LPN Outcome: Progressing Goal: Will not experience complications related to urinary retention 07/18/2020 1152 by Ariahna Smiddy J, LPN Outcome: Progressing 07/18/2020 1150 by Laney Potash J, LPN Outcome: Progressing   Problem: Pain Managment: Goal: General experience of comfort will improve 07/18/2020 1152 by Sayre Mazor J, LPN Outcome: Progressing 07/18/2020 1150 by Kerney Elbe, LPN Outcome: Progressing   Problem: Safety: Goal: Ability to remain free from injury will improve 07/18/2020 1152 by Kaytelyn Glore J, LPN Outcome: Progressing 07/18/2020 1150 by Kerney Elbe, LPN Outcome: Progressing

## 2020-07-19 LAB — BASIC METABOLIC PANEL
Anion gap: 8 (ref 5–15)
BUN: 28 mg/dL — ABNORMAL HIGH (ref 8–23)
CO2: 16 mmol/L — ABNORMAL LOW (ref 22–32)
Calcium: 8.4 mg/dL — ABNORMAL LOW (ref 8.9–10.3)
Chloride: 110 mmol/L (ref 98–111)
Creatinine, Ser: 1.42 mg/dL — ABNORMAL HIGH (ref 0.61–1.24)
GFR, Estimated: 56 mL/min — ABNORMAL LOW (ref >60)
Glucose, Bld: 90 mg/dL (ref 70–99)
Potassium: 4.1 mmol/L (ref 3.5–5.1)
Sodium: 134 mmol/L — ABNORMAL LOW (ref 135–145)

## 2020-07-19 LAB — CBC
HCT: 34.7 % — ABNORMAL LOW (ref 39.0–52.0)
Hemoglobin: 12.1 g/dL — ABNORMAL LOW (ref 13.0–17.0)
MCH: 29.7 pg (ref 26.0–34.0)
MCHC: 34.9 g/dL (ref 30.0–36.0)
MCV: 85 fL (ref 80.0–100.0)
Platelets: 296 10*3/uL (ref 150–400)
RBC: 4.08 MIL/uL — ABNORMAL LOW (ref 4.22–5.81)
RDW: 13.3 % (ref 11.5–15.5)
WBC: 6.6 10*3/uL (ref 4.0–10.5)
nRBC: 0 % (ref 0.0–0.2)

## 2020-07-19 LAB — GASTROINTESTINAL PATHOGEN PANEL PCR
C. difficile Tox A/B, PCR: NOT DETECTED
Campylobacter, PCR: NOT DETECTED
Cryptosporidium, PCR: NOT DETECTED
E coli (ETEC) LT/ST PCR: NOT DETECTED
E coli (STEC) stx1/stx2, PCR: NOT DETECTED
E coli 0157, PCR: NOT DETECTED
Giardia lamblia, PCR: NOT DETECTED
Norovirus, PCR: NOT DETECTED
Rotavirus A, PCR: NOT DETECTED
Salmonella, PCR: NOT DETECTED
Shigella, PCR: NOT DETECTED

## 2020-07-19 LAB — HEPATIC FUNCTION PANEL
ALT: 28 U/L (ref 0–44)
AST: 15 U/L (ref 15–41)
Albumin: 3.2 g/dL — ABNORMAL LOW (ref 3.5–5.0)
Alkaline Phosphatase: 47 U/L (ref 38–126)
Bilirubin, Direct: <0.1 mg/dL (ref 0.0–0.2)
Total Bilirubin: 0.7 mg/dL (ref 0.3–1.2)
Total Protein: 6.2 g/dL — ABNORMAL LOW (ref 6.5–8.1)

## 2020-07-19 LAB — LIPASE, BLOOD: Lipase: 77 U/L — ABNORMAL HIGH (ref 11–51)

## 2020-07-19 MED ORDER — SODIUM BICARBONATE 650 MG PO TABS
650.0000 mg | ORAL_TABLET | Freq: Four times a day (QID) | ORAL | Status: DC
  Administered 2020-07-19: 650 mg via ORAL
  Filled 2020-07-19 (×2): qty 1

## 2020-07-19 MED ORDER — ROSUVASTATIN CALCIUM 10 MG PO TABS
10.0000 mg | ORAL_TABLET | Freq: Every day | ORAL | 0 refills | Status: DC
Start: 2020-07-20 — End: 2020-07-25

## 2020-07-19 MED ORDER — PANTOPRAZOLE SODIUM 40 MG PO TBEC: 40.0000 mg | DELAYED_RELEASE_TABLET | Freq: Every day | ORAL | 11 refills | Status: DC

## 2020-07-19 MED ORDER — SODIUM BICARBONATE 650 MG PO TABS: 650.0000 mg | ORAL_TABLET | Freq: Two times a day (BID) | ORAL | 0 refills | Status: AC

## 2020-07-19 NOTE — Discharge Summary (Signed)
[facility] Discharge Summary   Patient: Charles Valentine WUX:[id number]  PCP: Steele Sizer, MD  Date of admission: 07/17/2020   Date of discharge:  07/19/2020     Discharge Diagnoses:  Principal diagnosis Pancreatitis Principal Problem:   Nausea vomiting and diarrhea Active Problems:   AKI (acute kidney injury) ([place])   Hypotension   Admitted From: Home Disposition:  Home   Recommendations for Outpatient Follow-up:  1. PCP: in 1 wk 2. Follow up LABS/TEST: BMP and lipase in 1 week   Diet recommendation: Cardiac diet, soft easy to digest for few days  Activity: The patient is advised to gradually reintroduce usual activities, as tolerated  Discharge Condition: stable  Code Status: Full code   History of present illness: As per the H and P dictated on admission Hospital Course:  Charles Valentine a 65 y.o.maleseen in ed with complaints ofnausea and diarrhea x2 weeks. Patient came in private vehicle lanes of dehydration not being able to keep anything down. Reports that he did see his primary care and blood work showed that his kidney function was abnormal which was 2.7. Diarrhea has been intermittent for the past 2 weeks and watery with at least 2-3 bowel movements per day. Patient does report feeling nauseated and poor appetite not being able to keep stuff down. Patient has not had any rashes or melena hematochezia hematemesis dysuria abdominal pain. Review systems also negative for any chest pain headaches dizziness blurred vision lower extremity edema, urinary issues, chest pain. Pt has past medical history oferectile dysfunction, hypertension, hyperlipidemia. Assessment and Plan: # Pancreatitis, presented with vomiting 2 days ago and diarrhea off and on for 2 weeks GI pathogen negative. Advanced diet to full liquid  and soft diet in the morning time.  Patient tolerated diet well, still has some diarrhea, loose stools but no nausea or vomiting, no any  abdominal pain. CT of the abdomen and pelvis noncontrast did not show any acute pathology, right renal cyst. Patient lipase is mildly elevated initial stages of pancreatitis. Lipase 90--80--77 gradually trending down, s/p IV fluid, continue oral hydration. # Acute kidney injury most likely prerenal due to dehydration. S/p IV Ringer lactate bolus given and s/p  normal saline IV infusion overnight. Creatinine 2.7-3.0-2.0-1.42 gradually improving.  Patient was advised to continue oral hydration.  Follow with PCP and repeat BMP after 1 week.  Bilateral renal ultrasound showed right complex renal cyst, patient will need renal MRI as an outpatient # Hypotension, most likely due to dehydration secondary to nausea and vomiting and diarrhea.  Patient was given IV fluid.  BP remained stable.  Antihypertensive medications has been discontinued on discharge.  Patient was advised to monitor BP and follow with PCP for further management. Body mass index is 23.5 kg/m.  Nutrition Problem: Inadequate oral intake Etiology: altered GI function Nutrition Interventions: Interventions: Ensure Enlive (each supplement provides 350kcal and 20 grams of protein),MVI,Magic cup  Patient was ambulatory without any assistance. On the day of the discharge the patient's vitals were stable, and no other acute medical condition were reported by patient. the patient was felt safe to be discharge at Home.  Consultants: None Procedures: None  Discharge Exam: General: Appear in no distress, no Rash; Oral Mucosa Clear, moist. Cardiovascular: S1 and S2 Present, no Murmur, Respiratory: nonormal respiratory effort, Bilateral Air entry present and no Crackles, no wheezes Abdomen: Bowel Sound present, Soft and no tenderness, no hernia Extremities: no Pedal edema, no calf tenderness Neurology: alert and oriented to time, place, and person  affect appropriate.  Filed Weights   07/17/20 1840 07/18/20 0503  Weight: 78 kg 78.6 kg    Vitals:   07/19/20 0749 07/19/20 1128  BP: 117/75 119/71  Pulse: 78 72  Resp: 18 17  Temp: 98.6 F (37 C) 98.4 F (36.9 C)  SpO2: 100% 100%    DISCHARGE MEDICATION: Allergies as of 07/19/2020   No Known Allergies     Medication List    STOP taking these medications   Nebivolol HCl 20 MG Tabs   olmesartan-hydrochlorothiazide 40-12.5 MG tablet Commonly known as: BENICAR HCT     TAKE these medications   aspirin EC 81 MG tablet Take 1 tablet (81 mg total) by mouth daily.   multivitamin tablet Take 1 tablet by mouth daily.   pantoprazole 40 MG tablet Commonly known as: Protonix Take 1 tablet (40 mg total) by mouth daily.   rosuvastatin 10 MG tablet Commonly known as: CRESTOR Take 1 tablet (10 mg total) by mouth daily. Start taking on: July 20, 2020 What changed:   medication strength  See the new instructions.   sodium bicarbonate 650 MG tablet Take 1 tablet (650 mg total) by mouth 2 (two) times daily for 3 days.   tadalafil 20 MG tablet Commonly known as: CIALIS Take 1 tablet (20 mg total) by mouth daily as needed for erectile dysfunction.   traZODone 100 MG tablet Commonly known as: DESYREL Take 1 tablet (100 mg total) by mouth at bedtime as needed for sleep.      No Known Allergies Discharge Instructions    Diet - low sodium heart healthy   Complete by: As directed    Discharge instructions   Complete by: As directed    Follow-up with PCP in 1 week, monitor blood pressure at home and follow with PCP to titrate medication accordingly.  Blood pressure was running soft so stopped antihypertensive medications.  Benicar stopped secondary to AKI and dehydration.  Follow with PCP to restart medications and continue to monitor BP at home Repeat BMP after 1 week and lipase level after 1 week   Increase activity slowly   Complete by: As directed       The results of significant diagnostics from this hospitalization (including imaging, microbiology,  ancillary and laboratory) are listed below for reference.    Significant Diagnostic Studies: CT ABDOMEN PELVIS WO CONTRAST  Result Date: 07/17/2020 CLINICAL DATA:  dehydration. PT has had N/V/D x 2 weeks. EXAM: CT ABDOMEN AND PELVIS WITHOUT CONTRAST TECHNIQUE: Multidetector CT imaging of the abdomen and pelvis was performed following the standard protocol without IV contrast. COMPARISON:  US renal 07/17/2020 FINDINGS: Lower chest: No acute abnormality. Hepatobiliary: No focal liver abnormality. No gallstones, gallbladder wall thickening, or pericholecystic fluid. No biliary dilatation. Pancreas: No focal lesion. Normal pancreatic contour. No surrounding inflammatory changes. No main pancreatic ductal dilatation. Hip Normal in size without focal abnormality. Spleen: Normal in size without focal abnormality. Adrenals/Urinary Tract: No adrenal nodule bilaterally. 2.2 cm fluid density lesion within the right kidney better evaluated on ultrasound 07/17/2020. No nephrolithiasis, no hydronephrosis, and otherwise no contour-deforming renal mass. No ureterolithiasis or hydroureter. The urinary bladder is unremarkable. Stomach/Bowel: PO contrast reaches the rectum. Stomach is within normal limits. No evidence of bowel wall thickening or dilatation. Scattered sigmoid diverticulosis. Appendix appears normal. Vascular/Lymphatic: No abdominal aorta or iliac aneurysm. Mild-to-moderate atherosclerotic plaque of the aorta and its branches. No abdominal, pelvic, or inguinal lymphadenopathy. Reproductive: Prominent prostate appear Other: No intraperitoneal free fluid. No intraperitoneal free  gas. No organized fluid collection. Musculoskeletal: No abdominal wall hernia or abnormality. No suspicious lytic or blastic osseous lesions. No acute displaced fracture. Multilevel degenerative changes of the spine. IMPRESSION: 1. A 2.2 cm fluid density lesion within the right kidney is better evaluated on ultrasound 07/17/2020. 2. Scattered  sigmoid diverticulosis with no acute diverticulitis. Electronically Signed   By: Iven Finn M.D.   On: 07/17/2020 23:21   US RENAL  Result Date: 07/17/2020 CLINICAL DATA:  Acute renal insufficiency, diarrhea EXAM: RENAL / URINARY TRACT ULTRASOUND COMPLETE COMPARISON:  None. FINDINGS: Right Kidney: Renal measurements: 11.5 x 4.8 by 5.5 cm = volume: 159.6 mL. Echogenicity within normal limits. There is a complex peripelvic cyst measuring 1.6 x 1.9 x 1.7 cm, with multiple internal septations. No hydronephrosis. Left Kidney: Renal measurements: 11.4 x 6.2 x 6.1 cm = volume: 225.9 mL. Echogenicity within normal limits. No mass or hydronephrosis visualized. Bladder: Appears normal for degree of bladder distention. Other: None. IMPRESSION: 1. 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 Further evaluation with nonemergent outpatient dedicated renal MRI recommended. 2. Otherwise unremarkable renal ultrasound. Electronically Signed   By: Randa Ngo M.D.   On: 07/17/2020 21:21    Microbiology: Recent Results (from the past 240 hour(s))  SARS CORONAVIRUS 2 (TAT 6-24 HRS) Nasopharyngeal Nasopharyngeal Swab     Status: None   Collection Time: 07/17/20  7:54 PM   Specimen: Nasopharyngeal Swab  Result Value Ref Range Status   SARS Coronavirus 2 NEGATIVE NEGATIVE Final    Comment: (NOTE) SARS-CoV-2 target nucleic acids are NOT DETECTED.  The SARS-CoV-2 RNA is generally detectable in upper and lower respiratory specimens during the acute phase of infection. Negative results do not preclude SARS-CoV-2 infection, do not rule out co-infections with other pathogens, and should not be used as the sole basis for treatment or other patient management decisions. Negative results must be combined with clinical observations, patient history, and epidemiological information. The expected result is Negative.  Fact Sheet for Patients: SugarRoll.be  Fact Sheet for  Healthcare Providers: https://www.woods-mathews.com/  This test is not yet approved or cleared by the [place] FDA and  has been authorized for detection and/or diagnosis of SARS-CoV-2 by FDA under an Emergency Use Authorization (EUA). This EUA will remain  in effect (meaning this test can be used) for the duration of the COVID-19 declaration under Se ction 564(b)(1) of the Act, 21 U.S.C. section 360bbb-3(b)(1), unless the authorization is terminated or revoked sooner.  Performed at [facility], [place] [address] [place],  [address]   Gastrointestinal Panel by PCR , Stool     Status: None   Collection Time: 07/18/20 12:30 AM   Specimen: Stool  Result Value Ref Range Status   Campylobacter species NOT DETECTED NOT DETECTED Final   Plesimonas shigelloides NOT DETECTED NOT DETECTED Final   Salmonella species NOT DETECTED NOT DETECTED Final   Yersinia enterocolitica NOT DETECTED NOT DETECTED Final   Vibrio species NOT DETECTED NOT DETECTED Final   Vibrio cholerae NOT DETECTED NOT DETECTED Final   Enteroaggregative E coli (EAEC) NOT DETECTED NOT DETECTED Final   Enteropathogenic E coli (EPEC) NOT DETECTED NOT DETECTED Final   Enterotoxigenic E coli (ETEC) NOT DETECTED NOT DETECTED Final   Shiga like toxin producing E coli (STEC) NOT DETECTED NOT DETECTED Final   Shigella/Enteroinvasive E coli (EIEC) NOT DETECTED NOT DETECTED Final   Cryptosporidium NOT DETECTED NOT DETECTED Final   Cyclospora cayetanensis NOT DETECTED NOT DETECTED Final  Entamoeba histolytica NOT DETECTED NOT DETECTED Final   Giardia lamblia NOT DETECTED NOT DETECTED Final   Adenovirus F40/41 NOT DETECTED NOT DETECTED Final   Astrovirus NOT DETECTED NOT DETECTED Final   Norovirus GI/GII NOT DETECTED NOT DETECTED Final   Rotavirus A NOT DETECTED NOT DETECTED Final   Sapovirus (I, II, IV, and V) NOT DETECTED NOT DETECTED Final    Comment: Performed at [facility], [address] [place], [place] [address]     Labs: CBC: Recent Labs  Lab 07/16/20 1030 07/17/20 1841 07/18/20 0907 07/19/20 0428  WBC 8.9 10.5 6.7 6.6  NEUTROABS 6,728  --   --   --   HGB 15.4 15.2 13.6 12.1*  HCT 46.0 44.3 38.9* 34.7*  MCV 87.5 85.0 84.2 85.0  PLT 386 394 330 003   Basic Metabolic Panel: Recent Labs  Lab 07/16/20 1030 07/17/20 1841 07/18/20 0907 07/19/20 0428  NA 128* 130* 131* 134*  K 5.4* 4.4 4.5 4.1  CL 99 102 105 110  CO2 16* 14* 15* 16*  GLUCOSE 98 109* 101* 90  BUN 56* 64* 50* 28*  CREATININE 2.72* 3.00* 2.04* 1.42*  CALCIUM 9.7 9.1 9.2 8.4*  MG  --   --  1.9  --   PHOS  --   --  3.8  --    Liver Function Tests: Recent Labs  Lab 07/16/20 1030 07/17/20 1841 07/19/20 0428  AST 17 20 15   ALT 42 41 28  ALKPHOS  --  63 47  BILITOT 0.4 0.9 0.7  PROT 7.8 8.0 6.2*  ALBUMIN  --  4.4 3.2*   Recent Labs  Lab 07/17/20 1841 07/18/20 0907 07/19/20 0428  LIPASE 90* 80* 77*   No results for input(s): AMMONIA in the last 168 hours. Cardiac Enzymes: No results for input(s): CKTOTAL, CKMB, CKMBINDEX, TROPONINI in the last 168 hours. BNP (last 3 results) No results for input(s): BNP in the last 8760 hours. CBG: No results for input(s): GLUCAP in the last 168 hours.  Time spent: 35 minutes  Signed:  Val Riles  [facility]  07/19/2020 12:21 PM

## 2020-07-22 ENCOUNTER — Telehealth: Payer: Self-pay

## 2020-07-22 NOTE — Telephone Encounter (Signed)
Transition Care Management Follow-up Telephone Call  Date of discharge and from where: 07/19/20   How have you been since you were released from the hospital? Pt states he is doing okay  Any questions or concerns? No  Items Reviewed:  Did the pt receive and understand the discharge instructions provided? Yes   Medications obtained and verified? Yes   Other? No   Any new allergies since your discharge? No   Dietary orders reviewed? Yes  Do you have support at home? Yes   Home Care and Equipment/Supplies: Were home health services ordered? no Were any new equipment or medical supplies ordered?  No  Functional Questionnaire: (I = Independent and D = Dependent) ADLs: I  Bathing/Dressing- I  Meal Prep- I  Eating- I  Maintaining continence- I  Transferring/Ambulation- I  Managing Meds- I  Follow up appointments reviewed:   PCP Hospital f/u appt confirmed? Yes  Scheduled to see Dr. Ancil Boozer on 07/25/20 @ 8:00.  Are transportation arrangements needed? No   If their condition worsens, is the pt aware to call PCP or go to the Emergency Dept.? Yes  Was the patient provided with contact information for the PCP's office or ED? Yes  Was to pt encouraged to call back with questions or concerns? Yes

## 2020-07-24 NOTE — Progress Notes (Signed)
Name: Charles Valentine   MRN: [id number]    DOB: 20-Dec-1955   Date:07/25/2020       Progress Note  [facility] Follow up  HPI  Admitted: 07/17/20 Discharged: 07/19/20  He was seen in our office with 2 weeks of diarrhea, fatigue , nausea and the day prior to visit an only a couple episodes of vomiting, the abdominal cramp was only before he had an urge to have a bowel movement. During his visit on 07/16/2020 we ordered labs and he was found to be in acute kidney failure, we called the patient and sent him to [facility]. He arrived hypotensive, he was given IV fluids, CT abdomen pelvis showed diverticulosis , also had a cyst on right kidney , US showed complex mass and needs outpatient MRI that we will order. CT also showed atherosclerosis of aorta, his rosuvastatin was decreased from 40 mg to 10 mg but since he has a lot of 40 mg pills at home, advised him to take half of Crestor until kidney function normalizes.  He states since he has been home he states appetite is gradually improving and he is now feeling very hungry, he states his bowel movements since he left the hospital bowels are back to normal, once a day and bristol 4. He denies abdominal pain, N/V  BP at home : 130-145/80-90 , he has been off Bystolic and Benicar hctz since he left the hospital , advised to continue monitoring and if bp stays above 130/85 he will resume half dose of Bystolic first   Hyperlipidemia: he was on Rosuvastatin 40 mg and he is now on 10 mg    Patient Active Problem List   Diagnosis Date Noted  . Sinus tachycardia 09/02/2016  . Essential hypertension 09/02/2016  . Eczema 06/08/2016  . Hyperlipidemia LDL goal <130 04/08/2016  . ED (erectile dysfunction)     No past surgical history on file.  Family History  Problem Relation Age of Onset  . Hypertension Mother   . Diabetes Mother   . Heart disease Mother 67       CABG  . Heart attack Mother 44  . Obesity Sister   . Drug abuse  Brother     Social History   Tobacco Use  . Smoking status: Former Smoker    Packs/day: 0.50    Years: 10.00    Pack years: 5.00    Types: Cigarettes    Quit date: 1989    Years since quitting: 33.2  . Smokeless tobacco: Never Used  Substance Use Topics  . Alcohol use: Yes    Alcohol/week: 8.0 standard drinks    Types: 6 Cans of beer, 2 Shots of liquor per week     Current Outpatient Medications:  .  aspirin EC 81 MG tablet, Take 1 tablet (81 mg total) by mouth daily., Disp: 30 tablet, Rfl: 0 .  FLOWFLEX COVID-19 AG HOME TEST KIT, TEST AS DIRECTED TODAY, Disp: , Rfl:  .  Multiple Vitamins-Minerals (MULTIVITAMIN) tablet, Take 1 tablet by mouth daily., Disp: 30 tablet, Rfl: 0 .  Nebivolol HCl 20 MG TABS, Take 0.5-1 tablets (10-20 mg total) by mouth daily. If bp increases and stays above 135/85, Disp: 90 tablet, Rfl: 0 .  pantoprazole (PROTONIX) 40 MG tablet, Take 1 tablet (40 mg total) by mouth daily., Disp: 30 tablet, Rfl: 11 .  rosuvastatin (CRESTOR) 40 MG tablet, Take 0.5 tablets (20 mg total) by mouth daily., Disp: 90 tablet, Rfl: 0 .  tadalafil (CIALIS) 20 MG tablet, Take 1 tablet (20 mg total) by mouth daily as needed for erectile dysfunction., Disp: 30 tablet, Rfl: 1 .  traZODone (DESYREL) 100 MG tablet, Take 1 tablet (100 mg total) by mouth at bedtime as needed for sleep., Disp: 90 tablet, Rfl: 1  No Known Allergies  I personally reviewed active problem list, medication list, allergies, family history, social history, health maintenance with the patient/caregiver today.   ROS  Constitutional: Negative for fever or weight change.  Respiratory: Negative for cough and shortness of breath.   Cardiovascular: Negative for chest pain or palpitations.  Gastrointestinal: Negative for abdominal pain, no bowel changes.  Musculoskeletal: Negative for gait problem or joint swelling.  Skin: Negative for rash.  Neurological: Negative for dizziness or headache.  No other specific  complaints in a complete review of systems (except as listed in HPI above).  Objective  Vitals:   07/25/20 0755  BP: 122/84  Pulse: 81  Resp: 16  Temp: 98.7 F (37.1 C)  TempSrc: Oral  SpO2: 99%  Weight: 173 lb (78.5 kg)  Height: 6' (1.829 m)    Body mass index is 23.46 kg/m.  Physical Exam  Constitutional: Patient appears well-developed and well-nourished.  No distress.  HEENT: head atraumatic, normocephalic, pupils equal and reactive to light,  neck supple Cardiovascular: Normal rate, regular rhythm and normal heart sounds.  No murmur heard. No BLE edema. Pulmonary/Chest: Effort normal and breath sounds normal. No respiratory distress. Abdominal: Soft.  There is no tenderness.normal bowel sounds Psychiatric: Patient has a normal mood and affect. behavior is normal. Judgment and thought content normal.  Recent Results (from the past 2160 hour(s))  POCT urinalysis dipstick     Status: Abnormal   Collection Time: 07/16/20 10:15 AM  Result Value Ref Range   Color, UA Yellow    Clarity, UA Clear    Glucose, UA Negative Negative   Bilirubin, UA Negative    Ketones, UA Trace    Spec Grav, UA >=1.030 (A) 1.010 - 1.025   Blood, UA Negative    pH, UA 6.0 5.0 - 8.0   Protein, UA Negative Negative   Urobilinogen, UA 0.2 0.2 or 1.0 E.U./dL   Nitrite, UA Negative    Leukocytes, UA Negative Negative   Appearance     Odor    CBC with Differential/Platelet     Status: None   Collection Time: 07/16/20 10:30 AM  Result Value Ref Range   WBC 8.9 3.8 - 10.8 Thousand/uL   RBC 5.26 4.20 - 5.80 Million/uL   Hemoglobin 15.4 13.2 - 17.1 g/dL   HCT 46.0 38.5 - 50.0 %   MCV 87.5 80.0 - 100.0 fL   MCH 29.3 27.0 - 33.0 pg   MCHC 33.5 32.0 - 36.0 g/dL   RDW 13.6 11.0 - 15.0 %   Platelets 386 140 - 400 Thousand/uL   MPV 10.1 7.5 - 12.5 fL   Neutro Abs 6,728 1,500 - 7,800 cells/uL   Lymphs Abs 1,166 850 - 3,900 cells/uL   Absolute Monocytes 935 200 - 950 cells/uL   Eosinophils  Absolute 53 15 - 500 cells/uL   Basophils Absolute 18 0 - 200 cells/uL   Neutrophils Relative % 75.6 %   Total Lymphocyte 13.1 %   Monocytes Relative 10.5 %   Eosinophils Relative 0.6 %   Basophils Relative 0.2 %  COMPLETE METABOLIC PANEL WITH GFR     Status: Abnormal   Collection Time: 07/16/20 10:30 AM  Result Value  Ref Range   Glucose, Bld 98 65 - 99 mg/dL    Comment: .            Fasting reference interval .    BUN 56 (H) 7 - 25 mg/dL   Creat 2.72 (H) 0.70 - 1.25 mg/dL    Comment: For patients >79 years of age, the reference limit for Creatinine is approximately 13% higher for people identified as African-American. .    GFR, Est Non African American 24 (L) > OR = 60 mL/min/1.28m   GFR, Est African American 28 (L) > OR = 60 mL/min/1.728m  BUN/Creatinine Ratio 21 6 - 22 (calc)   Sodium 128 (L) 135 - 146 mmol/L   Potassium 5.4 (H) 3.5 - 5.3 mmol/L   Chloride 99 98 - 110 mmol/L   CO2 16 (L) 20 - 32 mmol/L   Calcium 9.7 8.6 - 10.3 mg/dL   Total Protein 7.8 6.1 - 8.1 g/dL   Albumin 4.3 3.6 - 5.1 g/dL   Globulin 3.5 1.9 - 3.7 g/dL (calc)   AG Ratio 1.2 1.0 - 2.5 (calc)   Total Bilirubin 0.4 0.2 - 1.2 mg/dL   Alkaline phosphatase (APISO) 64 35 - 144 U/L   AST 17 10 - 35 U/L   ALT 42 9 - 46 U/L  Lipid panel     Status: Abnormal   Collection Time: 07/16/20 10:30 AM  Result Value Ref Range   Cholesterol 149 <200 mg/dL   HDL 30 (L) > OR = 40 mg/dL   Triglycerides 188 (H) <150 mg/dL   LDL Cholesterol (Calc) 91 mg/dL (calc)    Comment: Reference range: <100 . Desirable range <100 mg/dL for primary prevention;   <70 mg/dL for patients with CHD or diabetic patients  with > or = 2 CHD risk factors. . Marland KitchenDL-C is now calculated using the Martin-Hopkins  calculation, which is a validated novel method providing  better accuracy than the Friedewald equation in the  estimation of LDL-C.  MaCresenciano Genret al. JAAnnamaria [id number];[id number]   (http://education.[employer].com/faq/FAQ164)    Total CHOL/HDL Ratio 5.0 (H) <5.0 (calc)   Non-HDL Cholesterol (Calc) 119 <130 mg/dL (calc)    Comment: For patients with diabetes plus 1 major ASCVD risk  factor, treating to a non-HDL-C goal of <100 mg/dL  (LDL-C of <70 mg/dL) is considered a therapeutic  option.   TSH     Status: None   Collection Time: 07/16/20 10:30 AM  Result Value Ref Range   TSH 1.71 0.40 - 4.50 mIU/L  Gastrointestinal Pathogen Panel PCR     Status: None   Collection Time: 07/17/20  6:00 AM  Result Value Ref Range   Campylobacter, PCR NOT DETECTED NOT DETECT   C. difficile Tox A/B, PCR NOT DETECTED NOT DETECT   E coli 0157, PCR NOT DETECTED NOT DETECT   E coli (ETEC) LT/ST PCR NOT DETECTED NOT DETECT   E coli (STEC) stx1/stx2, PCR NOT DETECTED NOT DETECT   Salmonella, PCR NOT DETECTED NOT DETECT   Shigella, PCR NOT DETECTED NOT DETECT   Norovirus, PCR NOT DETECTED NOT DETECT   Rotavirus A, PCR NOT DETECTED NOT DETECT   Giardia lamblia, PCR NOT DETECTED NOT DETECT   Cryptosporidium, PCR NOT DETECTED NOT DETECT    Comment: . The xTAG(R) Gastrointestinal Pathogen Panel results are presumptive and must be confirmed by FDA-cleared tests or other acceptable reference methods. The results of this test should not be used as the sole basis for diagnosis,  treatment, or other patient management decisions. . Performed using the Luminex xTAG(R) Gastrointestinal Pathogen Panel test kit.   Urine Drug Screen, Qualitative ([facility] only)     Status: None   Collection Time: 07/17/20  6:25 PM  Result Value Ref Range   Tricyclic, Ur Screen NONE DETECTED NONE DETECTED   Amphetamines, Ur Screen NONE DETECTED NONE DETECTED   MDMA (Ecstasy)Ur Screen NONE DETECTED NONE DETECTED   Cocaine Metabolite,Ur [place] NONE DETECTED NONE DETECTED   Opiate, Ur Screen NONE DETECTED NONE DETECTED   Phencyclidine (PCP) Ur S NONE DETECTED NONE DETECTED   Cannabinoid 50 Ng, Ur [place] NONE  DETECTED NONE DETECTED   Barbiturates, Ur Screen NONE DETECTED NONE DETECTED   Benzodiazepine, Ur Scrn NONE DETECTED NONE DETECTED   Methadone Scn, Ur NONE DETECTED NONE DETECTED    Comment: (NOTE) Tricyclics + metabolites, urine    Cutoff 1000 ng/mL Amphetamines + metabolites, urine  Cutoff 1000 ng/mL MDMA (Ecstasy), urine              Cutoff 500 ng/mL Cocaine Metabolite, urine          Cutoff 300 ng/mL Opiate + metabolites, urine        Cutoff 300 ng/mL Phencyclidine (PCP), urine         Cutoff 25 ng/mL Cannabinoid, urine                 Cutoff 50 ng/mL Barbiturates + metabolites, urine  Cutoff 200 ng/mL Benzodiazepine, urine              Cutoff 200 ng/mL Methadone, urine                   Cutoff 300 ng/mL  The urine drug screen provides only a preliminary, unconfirmed analytical test result and should not be used for non-medical purposes. Clinical consideration and professional judgment should be applied to any positive drug screen result due to possible interfering substances. A more specific alternate chemical method must be used in order to obtain a confirmed analytical result. Gas chromatography / mass spectrometry (GC/MS) is the preferred confirm atory method. Performed at [facility], [address] [place], [place] [address]   Lipase, blood     Status: Abnormal   Collection Time: 07/17/20  6:41 PM  Result Value Ref Range   Lipase 90 (H) 11 - 51 U/L    Comment: Performed at [facility], [address] [place], [place] [address]  Comprehensive metabolic panel     Status: Abnormal   Collection Time: 07/17/20  6:41 PM  Result Value Ref Range   Sodium 130 (L) 135 - 145 mmol/L   Potassium 4.4 3.5 - 5.1 mmol/L   Chloride 102 98 - 111 mmol/L   CO2 14 (L) 22 - 32 mmol/L   Glucose, Bld 109 (H) 70 - 99 mg/dL    Comment: Glucose reference range applies only to samples taken after fasting for at least 8 hours.   BUN 64 (H) 8 - 23 mg/dL   Creatinine, Ser  3.00 (H) 0.61 - 1.24 mg/dL   Calcium 9.1 8.9 - 10.3 mg/dL   Total Protein 8.0 6.5 - 8.1 g/dL   Albumin 4.4 3.5 - 5.0 g/dL   AST 20 15 - 41 U/L   ALT 41 0 - 44 U/L   Alkaline Phosphatase 63 38 - 126 U/L   Total Bilirubin 0.9 0.3 - 1.2 mg/dL   GFR, Estimated 23 (L) >60 mL/min    Comment: (NOTE)  Calculated using the CKD-EPI Creatinine Equation (2021)    Anion gap 14 5 - 15    Comment: Performed at [facility], [address] [place], [place] [address]  CBC     Status: None   Collection Time: 07/17/20  6:41 PM  Result Value Ref Range   WBC 10.5 4.0 - 10.5 K/uL   RBC 5.21 4.22 - 5.81 MIL/uL   Hemoglobin 15.2 13.0 - 17.0 g/dL   HCT 44.3 39.0 - 52.0 %   MCV 85.0 80.0 - 100.0 fL   MCH 29.2 26.0 - 34.0 pg   MCHC 34.3 30.0 - 36.0 g/dL   RDW 13.2 11.5 - 15.5 %   Platelets 394 150 - 400 K/uL   nRBC 0.0 0.0 - 0.2 %    Comment: Performed at [facility], [address] [place], [place] [address]  SARS CORONAVIRUS 2 (TAT 6-24 HRS) Nasopharyngeal Nasopharyngeal Swab     Status: None   Collection Time: 07/17/20  7:54 PM   Specimen: Nasopharyngeal Swab  Result Value Ref Range   SARS Coronavirus 2 NEGATIVE NEGATIVE    Comment: (NOTE) SARS-CoV-2 target nucleic acids are NOT DETECTED.  The SARS-CoV-2 RNA is generally detectable in upper and lower respiratory specimens during the acute phase of infection. Negative results do not preclude SARS-CoV-2 infection, do not rule out co-infections with other pathogens, and should not be used as the sole basis for treatment or other patient management decisions. Negative results must be combined with clinical observations, patient history, and epidemiological information. The expected result is Negative.  Fact Sheet for Patients: SugarRoll.be  Fact Sheet for Healthcare Providers: https://www.woods-mathews.com/  This test is not yet approved or cleared by the [place] FDA and  has  been authorized for detection and/or diagnosis of SARS-CoV-2 by FDA under an Emergency Use Authorization (EUA). This EUA will remain  in effect (meaning this test can be used) for the duration of the COVID-19 declaration under Se ction 564(b)(1) of the Act, 21 U.S.C. section 360bbb-3(b)(1), unless the authorization is terminated or revoked sooner.  Performed at [facility], [address] [place], [place] [address]   HIV Antibody (routine testing w rflx)     Status: None   Collection Time: 07/17/20 11:38 PM  Result Value Ref Range   HIV Screen 4th Generation wRfx Non Reactive Non Reactive    Comment: Performed at [facility], [place] [address] [place], [place] [address]  Ethanol     Status: None   Collection Time: 07/17/20 11:38 PM  Result Value Ref Range   Alcohol, Ethyl (B) <10 <10 mg/dL    Comment: (NOTE) Lowest detectable limit for serum alcohol is 10 mg/dL.  For medical purposes only. Performed at [facility], [address] [place], [place] [address]   Urinalysis, Complete w Microscopic     Status: Abnormal   Collection Time: 07/18/20 12:30 AM  Result Value Ref Range   Color, Urine YELLOW (A) YELLOW   APPearance CLEAR (A) CLEAR   Specific Gravity, Urine 1.009 1.005 - 1.030   pH 5.0 5.0 - 8.0   Glucose, UA NEGATIVE NEGATIVE mg/dL   Hgb urine dipstick NEGATIVE NEGATIVE   Bilirubin Urine NEGATIVE NEGATIVE   Ketones, ur 5 (A) NEGATIVE mg/dL   Protein, ur NEGATIVE NEGATIVE mg/dL   Nitrite NEGATIVE NEGATIVE   Leukocytes,Ua NEGATIVE NEGATIVE   RBC / HPF 0-5 0 - 5 RBC/hpf   WBC, UA 0-5 0 - 5 WBC/hpf  Bacteria, UA NONE SEEN NONE SEEN   Squamous Epithelial / LPF 0-5 0 - 5   Mucus PRESENT    Hyaline Casts, UA PRESENT     Comment: Performed at [facility], [address] [place], [place] [address]  Gastrointestinal Panel by PCR , Stool     Status: None   Collection Time: 07/18/20 12:30 AM   Specimen: Stool  Result Value Ref Range    Campylobacter species NOT DETECTED NOT DETECTED   Plesimonas shigelloides NOT DETECTED NOT DETECTED   Salmonella species NOT DETECTED NOT DETECTED   Yersinia enterocolitica NOT DETECTED NOT DETECTED   Vibrio species NOT DETECTED NOT DETECTED   Vibrio cholerae NOT DETECTED NOT DETECTED   Enteroaggregative E coli (EAEC) NOT DETECTED NOT DETECTED   Enteropathogenic E coli (EPEC) NOT DETECTED NOT DETECTED   Enterotoxigenic E coli (ETEC) NOT DETECTED NOT DETECTED   Shiga like toxin producing E coli (STEC) NOT DETECTED NOT DETECTED   Shigella/Enteroinvasive E coli (EIEC) NOT DETECTED NOT DETECTED   Cryptosporidium NOT DETECTED NOT DETECTED   Cyclospora cayetanensis NOT DETECTED NOT DETECTED   Entamoeba histolytica NOT DETECTED NOT DETECTED   Giardia lamblia NOT DETECTED NOT DETECTED   Adenovirus F40/41 NOT DETECTED NOT DETECTED   Astrovirus NOT DETECTED NOT DETECTED   Norovirus GI/GII NOT DETECTED NOT DETECTED   Rotavirus A NOT DETECTED NOT DETECTED   Sapovirus (I, II, IV, and V) NOT DETECTED NOT DETECTED    Comment: Performed at [facility], [address] [place], [place] [address]  Osmolality     Status: None   Collection Time: 07/18/20  9:07 AM  Result Value Ref Range   Osmolality 289 275 - 295 mOsm/kg    Comment: Performed at [facility], [address] [place], [place] [address]  Lipase, blood     Status: Abnormal   Collection Time: 07/18/20  9:07 AM  Result Value Ref Range   Lipase 80 (H) 11 - 51 U/L    Comment: Performed at [facility], [address] [place], [place] [address]  Basic metabolic panel     Status: Abnormal   Collection Time: 07/18/20  9:07 AM  Result Value Ref Range   Sodium 131 (L) 135 - 145 mmol/L   Potassium 4.5 3.5 - 5.1 mmol/L   Chloride 105 98 - 111 mmol/L   CO2 15 (L) 22 - 32 mmol/L   Glucose, Bld 101 (H) 70 - 99 mg/dL    Comment: Glucose reference range applies only to samples taken after fasting for at least 8 hours.    BUN 50 (H) 8 - 23 mg/dL   Creatinine, Ser 2.04 (H) 0.61 - 1.24 mg/dL   Calcium 9.2 8.9 - 10.3 mg/dL   GFR, Estimated 36 (L) >60 mL/min    Comment: (NOTE) Calculated using the CKD-EPI Creatinine Equation (2021)    Anion gap 11 5 - 15    Comment: Performed at [facility], [address] [place], [place] [address]  CBC     Status: Abnormal   Collection Time: 07/18/20  9:07 AM  Result Value Ref Range   WBC 6.7 4.0 - 10.5 K/uL   RBC 4.62 4.22 - 5.81 MIL/uL   Hemoglobin 13.6 13.0 - 17.0 g/dL   HCT 38.9 (L) 39.0 - 52.0 %   MCV 84.2 80.0 - 100.0 fL   MCH 29.4 26.0 - 34.0 pg   MCHC 35.0 30.0 - 36.0 g/dL   RDW 13.2 11.5 -  15.5 %   Platelets 330 150 - 400 K/uL   nRBC 0.0 0.0 - 0.2 %    Comment: Performed at [facility], [address] [place], [place] [address]  Magnesium     Status: None   Collection Time: 07/18/20  9:07 AM  Result Value Ref Range   Magnesium 1.9 1.7 - 2.4 mg/dL    Comment: Performed at [facility], [address] [place], [place] [address]  Phosphorus     Status: None   Collection Time: 07/18/20  9:07 AM  Result Value Ref Range   Phosphorus 3.8 2.5 - 4.6 mg/dL    Comment: Performed at [facility], [address] [place], [place] [address]  Lipase, blood     Status: Abnormal   Collection Time: 07/19/20  4:28 AM  Result Value Ref Range   Lipase 77 (H) 11 - 51 U/L    Comment: Performed at [facility], [address] [place], [place] [address]  Basic metabolic panel     Status: Abnormal   Collection Time: 07/19/20  4:28 AM  Result Value Ref Range   Sodium 134 (L) 135 - 145 mmol/L   Potassium 4.1 3.5 - 5.1 mmol/L   Chloride 110 98 - 111 mmol/L   CO2 16 (L) 22 - 32 mmol/L   Glucose, Bld 90 70 - 99 mg/dL    Comment: Glucose reference range applies only to samples taken after fasting for at least 8 hours.   BUN 28 (H) 8 - 23 mg/dL   Creatinine, Ser 1.42 (H) 0.61 - 1.24 mg/dL   Calcium 8.4 (L) 8.9 - 10.3  mg/dL   GFR, Estimated 56 (L) >60 mL/min    Comment: (NOTE) Calculated using the CKD-EPI Creatinine Equation (2021)    Anion gap 8 5 - 15    Comment: Performed at [facility], [address] [place], [place] [address]  CBC     Status: Abnormal   Collection Time: 07/19/20  4:28 AM  Result Value Ref Range   WBC 6.6 4.0 - 10.5 K/uL   RBC 4.08 (L) 4.22 - 5.81 MIL/uL   Hemoglobin 12.1 (L) 13.0 - 17.0 g/dL   HCT 34.7 (L) 39.0 - 52.0 %   MCV 85.0 80.0 - 100.0 fL   MCH 29.7 26.0 - 34.0 pg   MCHC 34.9 30.0 - 36.0 g/dL   RDW 13.3 11.5 - 15.5 %   Platelets 296 150 - 400 K/uL   nRBC 0.0 0.0 - 0.2 %    Comment: Performed at [facility], [address] [place], [place] [address]  Hepatic function panel     Status: Abnormal   Collection Time: 07/19/20  4:28 AM  Result Value Ref Range   Total Protein 6.2 (L) 6.5 - 8.1 g/dL   Albumin 3.2 (L) 3.5 - 5.0 g/dL   AST 15 15 - 41 U/L   ALT 28 0 - 44 U/L   Alkaline Phosphatase 47 38 - 126 U/L   Total Bilirubin 0.7 0.3 - 1.2 mg/dL   Bilirubin, Direct <0.1 0.0 - 0.2 mg/dL   Indirect Bilirubin NOT CALCULATED 0.3 - 0.9 mg/dL    Comment: Performed at [facility], [address] [place],  [address]     PHQ2/9: Depression screen [facility] 2/9 07/25/2020 07/16/2020 07/25/2019 03/07/2019 12/01/2018  Decreased Interest 0 0 0 0 0  Down, Depressed, Hopeless 0 0 0 0 0  PHQ - 2 Score 0 0 0  0 0  Altered sleeping - - 0 0 2  Tired, decreased energy - - 0 0 0  Change in appetite - - 0 0 0  Feeling bad or failure about yourself  - - 0 0 0  Trouble concentrating - - 0 0 0  Moving slowly or fidgety/restless - - 0 0 0  Suicidal thoughts - - 0 0 0  PHQ-9 Score - - 0 0 2  Difficult doing work/chores - - - - Not difficult at all    phq 9 is negative   Fall Risk: Fall Risk  07/25/2020 07/16/2020 01/24/2020 07/25/2019 03/07/2019  Falls in the past year? 0 0 0 0 0  Number falls in past yr: 0 0 0 0 0  Injury with Fall? 0 0 0 0 0  Follow  up - - - - -     Functional Status Survey: Is the patient deaf or have difficulty hearing?: No Does the patient have difficulty seeing, even when wearing glasses/contacts?: No Does the patient have difficulty concentrating, remembering, or making decisions?: No Does the patient have difficulty walking or climbing stairs?: No Does the patient have difficulty dressing or bathing?: No Does the patient have difficulty doing errands alone such as visiting a doctor's office or shopping?: No    Assessment & Plan  1. Hospital discharge follow-up  - Ambulatory referral to Gastroenterology - Lipase - COMPLETE METABOLIC PANEL WITH GFR - CBC with Differential/Platelet  2. Nausea vomiting and diarrhea  - Ambulatory referral to Gastroenterology  3. Elevated lipase  He will follow up with GI, I don't believe it was pancreatitis, he only vomited 2 days prior to admission, symptoms was mostly diarrhea.   4. AKI (acute kidney injury) ([place])  Improved prior to discharge and we will recheck labs   5. Hypotension due to hypovolemia  Resolved, but bp not elevated yet, resume bp medication depending on readings at home   6. Colon cancer screening  - Ambulatory referral to Gastroenterology  7. Need for Tdap vaccination  - Tdap vaccine greater than or equal to 7yo IM  8. Need for shingles vaccine  - Varicella-zoster vaccine IM  9. Hypertension, benign  - Nebivolol HCl 20 MG TABS; Take 0.5-1 tablets (10-20 mg total) by mouth daily. If bp increases and stays above 135/85  Dispense: 90 tablet; Refill: 0 - we will only resume Benicar HCTZ when bp starts to stay above 093/23 with bystolic already on board. , we also need to make sure kidney function is back to normal  10. Hyperlipidemia LDL goal <130  - rosuvastatin (CRESTOR) 40 MG tablet; Take 0.5 tablets (20 mg total) by mouth daily.  Dispense: 90 tablet; Refill: 0   11. Diverticulosis   12. Atherosclerosis of abdominal aorta  ([place])  On statin therapy   13. Renal cyst, right  We will order renal US as recommended  MRI

## 2020-07-25 ENCOUNTER — Other Ambulatory Visit: Payer: Self-pay

## 2020-07-25 ENCOUNTER — Ambulatory Visit: Payer: Managed Care, Other (non HMO) | Admitting: Family Medicine

## 2020-07-25 ENCOUNTER — Encounter: Payer: Self-pay | Admitting: Family Medicine

## 2020-07-25 VITALS — BP 122/84 | HR 81 | Temp 98.7°F | Resp 16 | Ht 72.0 in | Wt 173.0 lb

## 2020-07-25 DIAGNOSIS — I10 Essential (primary) hypertension: Secondary | ICD-10-CM

## 2020-07-25 DIAGNOSIS — R748 Abnormal levels of other serum enzymes: Secondary | ICD-10-CM | POA: Diagnosis not present

## 2020-07-25 DIAGNOSIS — N281 Cyst of kidney, acquired: Secondary | ICD-10-CM

## 2020-07-25 DIAGNOSIS — Z23 Encounter for immunization: Secondary | ICD-10-CM | POA: Diagnosis not present

## 2020-07-25 DIAGNOSIS — E785 Hyperlipidemia, unspecified: Secondary | ICD-10-CM

## 2020-07-25 DIAGNOSIS — R197 Diarrhea, unspecified: Secondary | ICD-10-CM

## 2020-07-25 DIAGNOSIS — Z09 Encounter for follow-up examination after completed treatment for conditions other than malignant neoplasm: Secondary | ICD-10-CM

## 2020-07-25 DIAGNOSIS — N179 Acute kidney failure, unspecified: Secondary | ICD-10-CM

## 2020-07-25 DIAGNOSIS — R112 Nausea with vomiting, unspecified: Secondary | ICD-10-CM

## 2020-07-25 DIAGNOSIS — I700 Atherosclerosis of aorta: Secondary | ICD-10-CM | POA: Insufficient documentation

## 2020-07-25 DIAGNOSIS — E861 Hypovolemia: Secondary | ICD-10-CM

## 2020-07-25 DIAGNOSIS — I9589 Other hypotension: Secondary | ICD-10-CM

## 2020-07-25 DIAGNOSIS — K5790 Diverticulosis of intestine, part unspecified, without perforation or abscess without bleeding: Secondary | ICD-10-CM

## 2020-07-25 DIAGNOSIS — Z1211 Encounter for screening for malignant neoplasm of colon: Secondary | ICD-10-CM

## 2020-07-25 MED ORDER — ROSUVASTATIN CALCIUM 40 MG PO TABS: 20.0000 mg | ORAL_TABLET | Freq: Every day | ORAL | 0 refills | Status: DC

## 2020-07-25 MED ORDER — NEBIVOLOL HCL 20 MG PO TABS
10.0000 mg | ORAL_TABLET | Freq: Every day | ORAL | 0 refills | Status: DC
Start: 2020-07-25 — End: 2020-08-28

## 2020-07-25 NOTE — Patient Instructions (Signed)
Monitor BP daily, if bp starts to go up and stays above 135/85 , resume half dose of Bystolic first and contact me Continue Rosuvastatin but increase to 20 mg daily once you are done with the 10 mg pills you have at home

## 2020-07-26 LAB — COMPLETE METABOLIC PANEL WITH GFR
AG Ratio: 1.4 (calc) (ref 1.0–2.5)
ALT: 68 U/L — ABNORMAL HIGH (ref 9–46)
AST: 36 U/L — ABNORMAL HIGH (ref 10–35)
Albumin: 4.1 g/dL (ref 3.6–5.1)
Alkaline phosphatase (APISO): 59 U/L (ref 35–144)
BUN: 9 mg/dL (ref 7–25)
CO2: 26 mmol/L (ref 20–32)
Calcium: 9.4 mg/dL (ref 8.6–10.3)
Chloride: 103 mmol/L (ref 98–110)
Creat: 1.12 mg/dL (ref 0.70–1.25)
GFR, Est African American: 81 mL/min/{1.73_m2} (ref >=60)
GFR, Est Non African American: 70 mL/min/{1.73_m2} (ref >=60)
Globulin: 2.9 g/dL (calc) (ref 1.9–3.7)
Glucose, Bld: 98 mg/dL (ref 65–99)
Potassium: 4.5 mmol/L (ref 3.5–5.3)
Sodium: 138 mmol/L (ref 135–146)
Total Bilirubin: 0.3 mg/dL (ref 0.2–1.2)
Total Protein: 7 g/dL (ref 6.1–8.1)

## 2020-07-26 LAB — CBC WITH DIFFERENTIAL/PLATELET
Absolute Monocytes: 689 cells/uL (ref 200–950)
Basophils Absolute: 29 cells/uL (ref 0–200)
Basophils Relative: 0.3 %
Eosinophils Absolute: 97 cells/uL (ref 15–500)
Eosinophils Relative: 1 %
HCT: 40.8 % (ref 38.5–50.0)
Hemoglobin: 13 g/dL — ABNORMAL LOW (ref 13.2–17.1)
Lymphs Abs: 1416 cells/uL (ref 850–3900)
MCH: 28.4 pg (ref 27.0–33.0)
MCHC: 31.9 g/dL — ABNORMAL LOW (ref 32.0–36.0)
MCV: 89.1 fL (ref 80.0–100.0)
MPV: 10.4 fL (ref 7.5–12.5)
Monocytes Relative: 7.1 %
Neutro Abs: 7469 cells/uL (ref 1500–7800)
Neutrophils Relative %: 77 %
Platelets: 376 10*3/uL (ref 140–400)
RBC: 4.58 10*6/uL (ref 4.20–5.80)
RDW: 13.2 % (ref 11.0–15.0)
Total Lymphocyte: 14.6 %
WBC: 9.7 10*3/uL (ref 3.8–10.8)

## 2020-07-26 LAB — LIPASE: Lipase: 77 U/L — ABNORMAL HIGH (ref 7–60)

## 2020-08-09 ENCOUNTER — Ambulatory Visit
Admission: RE | Admit: 2020-08-09 | Discharge: 2020-08-09 | Disposition: A | Discharge status: Home / Self Care | Payer: Managed Care, Other (non HMO) | Source: Ambulatory Visit | Attending: Family Medicine | Admitting: Family Medicine

## 2020-08-09 ENCOUNTER — Other Ambulatory Visit: Payer: Self-pay

## 2020-08-09 DIAGNOSIS — N281 Cyst of kidney, acquired: Secondary | ICD-10-CM | POA: Diagnosis present

## 2020-08-09 MED ORDER — GADOBUTROL 1 MMOL/ML IV SOLN
7.5000 mL | Freq: Once | INTRAVENOUS | Status: AC | PRN
  Administered 2020-08-09: 7.5 mL via INTRAVENOUS

## 2020-08-28 ENCOUNTER — Ambulatory Visit: Payer: Managed Care, Other (non HMO) | Admitting: Family Medicine

## 2020-08-28 ENCOUNTER — Other Ambulatory Visit: Payer: Self-pay

## 2020-08-28 ENCOUNTER — Encounter: Payer: Self-pay | Admitting: Family Medicine

## 2020-08-28 VITALS — BP 136/88 | HR 86 | Temp 98.4°F | Resp 16 | Ht 72.0 in | Wt 179.0 lb

## 2020-08-28 DIAGNOSIS — I10 Essential (primary) hypertension: Secondary | ICD-10-CM | POA: Diagnosis not present

## 2020-08-28 DIAGNOSIS — R748 Abnormal levels of other serum enzymes: Secondary | ICD-10-CM | POA: Diagnosis not present

## 2020-08-28 DIAGNOSIS — N281 Cyst of kidney, acquired: Secondary | ICD-10-CM

## 2020-08-28 DIAGNOSIS — I700 Atherosclerosis of aorta: Secondary | ICD-10-CM | POA: Diagnosis not present

## 2020-08-28 DIAGNOSIS — E785 Hyperlipidemia, unspecified: Secondary | ICD-10-CM

## 2020-08-28 DIAGNOSIS — D649 Anemia, unspecified: Secondary | ICD-10-CM

## 2020-08-28 DIAGNOSIS — N528 Other male erectile dysfunction: Secondary | ICD-10-CM

## 2020-08-28 DIAGNOSIS — F5101 Primary insomnia: Secondary | ICD-10-CM

## 2020-08-28 MED ORDER — NEBIVOLOL HCL 10 MG PO TABS: 10.0000 mg | ORAL_TABLET | Freq: Every day | ORAL | 0 refills | Status: DC

## 2020-08-28 MED ORDER — TADALAFIL 20 MG PO TABS: 20.0000 mg | ORAL_TABLET | Freq: Every day | ORAL | 1 refills | Status: DC | PRN

## 2020-08-28 NOTE — Progress Notes (Signed)
Name: Charles Valentine   MRN: [id number]    DOB: [birth year]   Date:08/28/2020       Progress Note  Subjective  Chief Complaint  Follow Up  HPI  HTN: diagnosedin 2018, he wasNorvasc 10 mg also tried Exforge but switchedto Bystolic because of tachycardia.Seen by cardiologist, Dr. Saunders Revel. He was hospitalized in April, he was dehydrated and bp dropped during his stay, he has been on Benicart HCTZ since, Bystolic was stopped but bp increased after he saw me , he took a half of bystolic once because bp spiked to 150's but has been 140's at home. We will try resuming at 10 mg   Echo 08/2016 : - Left ventricle: The cavity size was normal. Wall thickness was normal. Systolic function was normal. The estimated ejection fraction was in the range of 60% to 65%. Wall motion was normal; there were no regional wall motion abnormalities. Left ventricular diastolic function parameters were normal.  Hyperlipidemia/Atherosclerosis aorta :plaques found on CT done 06/2020 he is on statin therapy now and tolerating it well, also on aspirin, last LDL was not at goal, we increase his dose 6 months ago to 40 mg , but he is down to 10 mg since April, after hospital  Discharge. He is actually cutting the 40 mg and taking about one quarter   ED: he is taking Cialis occasionally,doing well on medication, no side effects. He needs a refill today.   Insomnia:he states retired last year, and is sleeping well, takes medication very seldom, still has some at home   During imaging for diarrhea he was found to have a renal cyst, US showed possible complex cyst, but MRI was benign   MRI abdomen done 08/09/2020:  2.3 x 1.6 cm cyst with a single thin septation in the posterior interpolar right kidney (series 19/image 22). No solid component or enhancement following contrast administration. 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  Patient Active Problem List   Diagnosis Date Noted   . Atherosclerosis of abdominal aorta ([place]) 07/25/2020  . Diverticulosis 07/25/2020  . Sinus tachycardia 09/02/2016  . Essential hypertension 09/02/2016  . Eczema 06/08/2016  . Hyperlipidemia LDL goal <130 04/08/2016  . ED (erectile dysfunction)     No past surgical history on file.  Family History  Problem Relation Age of Onset  . Hypertension Mother   . Diabetes Mother   . Heart disease Mother 21       CABG  . Heart attack Mother 65  . Obesity Sister   . Drug abuse Brother     Social History   Tobacco Use  . Smoking status: Former Smoker    Packs/day: 0.50    Years: 10.00    Pack years: 5.00    Types: Cigarettes    Quit date: 1989    Years since quitting: 33.3  . Smokeless tobacco: Never Used  Substance Use Topics  . Alcohol use: Yes    Alcohol/week: 8.0 standard drinks    Types: 6 Cans of beer, 2 Shots of liquor per week     Current Outpatient Medications:  .  aspirin EC 81 MG tablet, Take 1 tablet (81 mg total) by mouth daily., Disp: 30 tablet, Rfl: 0 .  Multiple Vitamins-Minerals (MULTIVITAMIN) tablet, Take 1 tablet by mouth daily., Disp: 30 tablet, Rfl: 0 .  Nebivolol HCl 20 MG TABS, Take 0.5-1 tablets (10-20 mg total) by mouth daily. If bp increases and stays above 135/85, Disp: 90 tablet, Rfl: 0 .  pantoprazole (PROTONIX) 40 MG tablet, Take 1 tablet (40 mg total) by mouth daily., Disp: 30 tablet, Rfl: 11 .  rosuvastatin (CRESTOR) 40 MG tablet, Take 0.5 tablets (20 mg total) by mouth daily., Disp: 90 tablet, Rfl: 0 .  tadalafil (CIALIS) 20 MG tablet, Take 1 tablet (20 mg total) by mouth daily as needed for erectile dysfunction., Disp: 30 tablet, Rfl: 1 .  traZODone (DESYREL) 100 MG tablet, Take 1 tablet (100 mg total) by mouth at bedtime as needed for sleep., Disp: 90 tablet, Rfl: 1  No Known Allergies  I personally reviewed active problem list, medication list, allergies, family history, social history, health maintenance with the patient/caregiver  today.   ROS  Constitutional: Negative for fever , positive for  weight change.  Respiratory: Negative for cough and shortness of breath.   Cardiovascular: Negative for chest pain or palpitations.  Gastrointestinal: Negative for abdominal pain, no bowel changes.  Musculoskeletal: Negative for gait problem or joint swelling.  Skin: Negative for rash.  Neurological: Negative for dizziness or headache.  No other specific complaints in a complete review of systems (except as listed in HPI above).  Objective  Vitals:   08/28/20 0802  BP: 136/88  Pulse: 86  Resp: 16  Temp: 98.4 F (36.9 C)  TempSrc: Oral  SpO2: 97%  Weight: 179 lb (81.2 kg)  Height: 6' (1.829 m)    Body mass index is 24.28 kg/m.  Physical Exam  Constitutional: Patient appears well-developed and well-nourished.  No distress.  HEENT: head atraumatic, normocephalic, pupils equal and reactive to light,  neck supple Cardiovascular: Normal rate, regular rhythm and normal heart sounds.  No murmur heard. No BLE edema. Pulmonary/Chest: Effort normal and breath sounds normal. No respiratory distress. Abdominal: Soft.  There is no tenderness. Psychiatric: Patient has a normal mood and affect. behavior is normal. Judgment and thought content normal.  Recent Results (from the past 2160 hour(s))  POCT urinalysis dipstick     Status: Abnormal   Collection Time: 07/16/20 10:15 AM  Result Value Ref Range   Color, UA Yellow    Clarity, UA Clear    Glucose, UA Negative Negative   Bilirubin, UA Negative    Ketones, UA Trace    Spec Grav, UA >=1.030 (A) 1.010 - 1.025   Blood, UA Negative    pH, UA 6.0 5.0 - 8.0   Protein, UA Negative Negative   Urobilinogen, UA 0.2 0.2 or 1.0 E.U./dL   Nitrite, UA Negative    Leukocytes, UA Negative Negative   Appearance     Odor    CBC with Differential/Platelet     Status: None   Collection Time: 07/16/20 10:30 AM  Result Value Ref Range   WBC 8.9 3.8 - 10.8 Thousand/uL   RBC  5.26 4.20 - 5.80 Million/uL   Hemoglobin 15.4 13.2 - 17.1 g/dL   HCT 46.0 38.5 - 50.0 %   MCV 87.5 80.0 - 100.0 fL   MCH 29.3 27.0 - 33.0 pg   MCHC 33.5 32.0 - 36.0 g/dL   RDW 13.6 11.0 - 15.0 %   Platelets 386 140 - 400 Thousand/uL   MPV 10.1 7.5 - 12.5 fL   Neutro Abs 6,728 1,500 - 7,800 cells/uL   Lymphs Abs 1,166 850 - 3,900 cells/uL   Absolute Monocytes 935 200 - 950 cells/uL   Eosinophils Absolute 53 15 - 500 cells/uL   Basophils Absolute 18 0 - 200 cells/uL   Neutrophils Relative % 75.6 %   Total  Lymphocyte 13.1 %   Monocytes Relative 10.5 %   Eosinophils Relative 0.6 %   Basophils Relative 0.2 %  COMPLETE METABOLIC PANEL WITH GFR     Status: Abnormal   Collection Time: 07/16/20 10:30 AM  Result Value Ref Range   Glucose, Bld 98 65 - 99 mg/dL    Comment: .            Fasting reference interval .    BUN 56 (H) 7 - 25 mg/dL   Creat 2.72 (H) 0.70 - 1.25 mg/dL    Comment: For patients >40 years of age, the reference limit for Creatinine is approximately 13% higher for people identified as African-American. .    GFR, Est Non African American 24 (L) > OR = 60 mL/min/1.21m   GFR, Est African American 28 (L) > OR = 60 mL/min/1.766m  BUN/Creatinine Ratio 21 6 - 22 (calc)   Sodium 128 (L) 135 - 146 mmol/L   Potassium 5.4 (H) 3.5 - 5.3 mmol/L   Chloride 99 98 - 110 mmol/L   CO2 16 (L) 20 - 32 mmol/L   Calcium 9.7 8.6 - 10.3 mg/dL   Total Protein 7.8 6.1 - 8.1 g/dL   Albumin 4.3 3.6 - 5.1 g/dL   Globulin 3.5 1.9 - 3.7 g/dL (calc)   AG Ratio 1.2 1.0 - 2.5 (calc)   Total Bilirubin 0.4 0.2 - 1.2 mg/dL   Alkaline phosphatase (APISO) 64 35 - 144 U/L   AST 17 10 - 35 U/L   ALT 42 9 - 46 U/L  Lipid panel     Status: Abnormal   Collection Time: 07/16/20 10:30 AM  Result Value Ref Range   Cholesterol 149 <200 mg/dL   HDL 30 (L) > OR = 40 mg/dL   Triglycerides 188 (H) <150 mg/dL   LDL Cholesterol (Calc) 91 mg/dL (calc)    Comment: Reference range: <100 . Desirable range  <100 mg/dL for primary prevention;   <70 mg/dL for patients with CHD or diabetic patients  with > or = 2 CHD risk factors. . Marland KitchenDL-C is now calculated using the Martin-Hopkins  calculation, which is a validated novel method providing  better accuracy than the Friedewald equation in the  estimation of LDL-C.  MaCresenciano Genret al. JAAnnamaria Helling200814;[phone]  (http://education.[employer].com/faq/FAQ164)    Total CHOL/HDL Ratio 5.0 (H) <5.0 (calc)   Non-HDL Cholesterol (Calc) 119 <130 mg/dL (calc)    Comment: For patients with diabetes plus 1 major ASCVD risk  factor, treating to a non-HDL-C goal of <100 mg/dL  (LDL-C of <70 mg/dL) is considered a therapeutic  option.   TSH     Status: None   Collection Time: 07/16/20 10:30 AM  Result Value Ref Range   TSH 1.71 0.40 - 4.50 mIU/L  Gastrointestinal Pathogen Panel PCR     Status: None   Collection Time: 07/17/20  6:00 AM  Result Value Ref Range   Campylobacter, PCR NOT DETECTED NOT DETECT   C. difficile Tox A/B, PCR NOT DETECTED NOT DETECT   E coli 0157, PCR NOT DETECTED NOT DETECT   E coli (ETEC) LT/ST PCR NOT DETECTED NOT DETECT   E coli (STEC) stx1/stx2, PCR NOT DETECTED NOT DETECT   Salmonella, PCR NOT DETECTED NOT DETECT   Shigella, PCR NOT DETECTED NOT DETECT   Norovirus, PCR NOT DETECTED NOT DETECT   Rotavirus A, PCR NOT DETECTED NOT DETECT   Giardia lamblia, PCR NOT DETECTED NOT DETECT   Cryptosporidium, PCR NOT  DETECTED NOT DETECT    Comment: . The xTAG(R) Gastrointestinal Pathogen Panel results are presumptive and must be confirmed by FDA-cleared tests or other acceptable reference methods. The results of this test should not be used as the sole basis for diagnosis, treatment, or other patient management decisions. . Performed using the Luminex xTAG(R) Gastrointestinal Pathogen Panel test kit.   Urine Drug Screen, Qualitative ([facility] only)     Status: None   Collection Time: 07/17/20  6:25 PM  Result Value Ref  Range   Tricyclic, Ur Screen NONE DETECTED NONE DETECTED   Amphetamines, Ur Screen NONE DETECTED NONE DETECTED   MDMA (Ecstasy)Ur Screen NONE DETECTED NONE DETECTED   Cocaine Metabolite,Ur [place] NONE DETECTED NONE DETECTED   Opiate, Ur Screen NONE DETECTED NONE DETECTED   Phencyclidine (PCP) Ur S NONE DETECTED NONE DETECTED   Cannabinoid 50 Ng, Ur [place] NONE DETECTED NONE DETECTED   Barbiturates, Ur Screen NONE DETECTED NONE DETECTED   Benzodiazepine, Ur Scrn NONE DETECTED NONE DETECTED   Methadone Scn, Ur NONE DETECTED NONE DETECTED    Comment: (NOTE) Tricyclics + metabolites, urine    Cutoff 1000 ng/mL Amphetamines + metabolites, urine  Cutoff 1000 ng/mL MDMA (Ecstasy), urine              Cutoff 500 ng/mL Cocaine Metabolite, urine          Cutoff 300 ng/mL Opiate + metabolites, urine        Cutoff 300 ng/mL Phencyclidine (PCP), urine         Cutoff 25 ng/mL Cannabinoid, urine                 Cutoff 50 ng/mL Barbiturates + metabolites, urine  Cutoff 200 ng/mL Benzodiazepine, urine              Cutoff 200 ng/mL Methadone, urine                   Cutoff 300 ng/mL  The urine drug screen provides only a preliminary, unconfirmed analytical test result and should not be used for non-medical purposes. Clinical consideration and professional judgment should be applied to any positive drug screen result due to possible interfering substances. A more specific alternate chemical method must be used in order to obtain a confirmed analytical result. Gas chromatography / mass spectrometry (GC/MS) is the preferred confirm atory method. Performed at [facility], [address] [place], [place] [address]   Lipase, blood     Status: Abnormal   Collection Time: 07/17/20  6:41 PM  Result Value Ref Range   Lipase 90 (H) 11 - 51 U/L    Comment: Performed at [facility], [address] [place], [place] [address]  Comprehensive metabolic panel     Status: Abnormal   Collection  Time: 07/17/20  6:41 PM  Result Value Ref Range   Sodium 130 (L) 135 - 145 mmol/L   Potassium 4.4 3.5 - 5.1 mmol/L   Chloride 102 98 - 111 mmol/L   CO2 14 (L) 22 - 32 mmol/L   Glucose, Bld 109 (H) 70 - 99 mg/dL    Comment: Glucose reference range applies only to samples taken after fasting for at least 8 hours.   BUN 64 (H) 8 - 23 mg/dL   Creatinine, Ser 3.00 (H) 0.61 - 1.24 mg/dL   Calcium 9.1 8.9 - 10.3 mg/dL   Total Protein 8.0 6.5 - 8.1 g/dL   Albumin 4.4 3.5 - 5.0 g/dL   AST 20  15 - 41 U/L   ALT 41 0 - 44 U/L   Alkaline Phosphatase 63 38 - 126 U/L   Total Bilirubin 0.9 0.3 - 1.2 mg/dL   GFR, Estimated 23 (L) >60 mL/min    Comment: (NOTE) Calculated using the CKD-EPI Creatinine Equation (2021)    Anion gap 14 5 - 15    Comment: Performed at [facility], [address] [place], [place] [address]  CBC     Status: None   Collection Time: 07/17/20  6:41 PM  Result Value Ref Range   WBC 10.5 4.0 - 10.5 K/uL   RBC 5.21 4.22 - 5.81 MIL/uL   Hemoglobin 15.2 13.0 - 17.0 g/dL   HCT 44.3 39.0 - 52.0 %   MCV 85.0 80.0 - 100.0 fL   MCH 29.2 26.0 - 34.0 pg   MCHC 34.3 30.0 - 36.0 g/dL   RDW 13.2 11.5 - 15.5 %   Platelets 394 150 - 400 K/uL   nRBC 0.0 0.0 - 0.2 %    Comment: Performed at [facility], [address] [place], [place] [address]  SARS CORONAVIRUS 2 (TAT 6-24 HRS) Nasopharyngeal Nasopharyngeal Swab     Status: None   Collection Time: 07/17/20  7:54 PM   Specimen: Nasopharyngeal Swab  Result Value Ref Range   SARS Coronavirus 2 NEGATIVE NEGATIVE    Comment: (NOTE) SARS-CoV-2 target nucleic acids are NOT DETECTED.  The SARS-CoV-2 RNA is generally detectable in upper and lower respiratory specimens during the acute phase of infection. Negative results do not preclude SARS-CoV-2 infection, do not rule out co-infections with other pathogens, and should not be used as the sole basis for treatment or other patient management decisions. Negative  results must be combined with clinical observations, patient history, and epidemiological information. The expected result is Negative.  Fact Sheet for Patients: SugarRoll.be  Fact Sheet for Healthcare Providers: https://www.woods-mathews.com/  This test is not yet approved or cleared by the [place] FDA and  has been authorized for detection and/or diagnosis of SARS-CoV-2 by FDA under an Emergency Use Authorization (EUA). This EUA will remain  in effect (meaning this test can be used) for the duration of the COVID-19 declaration under Se ction 564(b)(1) of the Act, 21 U.S.C. section 360bbb-3(b)(1), unless the authorization is terminated or revoked sooner.  Performed at [facility], [place] [address], [place], [place] [address]   HIV Antibody (routine testing w rflx)     Status: None   Collection Time: 07/17/20 11:38 PM  Result Value Ref Range   HIV Screen 4th Generation wRfx Non Reactive Non Reactive    Comment: Performed at [facility], [place] [address] [place], [place] [address]  Ethanol     Status: None   Collection Time: 07/17/20 11:38 PM  Result Value Ref Range   Alcohol, Ethyl (B) <10 <10 mg/dL    Comment: (NOTE) Lowest detectable limit for serum alcohol is 10 mg/dL.  For medical purposes only. Performed at [facility], [address] [place], [place] [address]   Urinalysis, Complete w Microscopic     Status: Abnormal   Collection Time: 07/18/20 12:30 AM  Result Value Ref Range   Color, Urine YELLOW (A) YELLOW   APPearance CLEAR (A) CLEAR   Specific Gravity, Urine 1.009 1.005 - 1.030   pH 5.0 5.0 - 8.0   Glucose, UA NEGATIVE NEGATIVE mg/dL   Hgb urine dipstick NEGATIVE NEGATIVE   Bilirubin Urine NEGATIVE NEGATIVE  Ketones, ur 5 (A) NEGATIVE mg/dL   Protein, ur NEGATIVE NEGATIVE mg/dL   Nitrite NEGATIVE NEGATIVE   Leukocytes,Ua NEGATIVE NEGATIVE   RBC / HPF 0-5 0 - 5 RBC/hpf   WBC, UA 0-5 0 -  5 WBC/hpf   Bacteria, UA NONE SEEN NONE SEEN   Squamous Epithelial / LPF 0-5 0 - 5   Mucus PRESENT    Hyaline Casts, UA PRESENT     Comment: Performed at [facility], [address] [place], [place] [address]  Gastrointestinal Panel by PCR , Stool     Status: None   Collection Time: 07/18/20 12:30 AM   Specimen: Stool  Result Value Ref Range   Campylobacter species NOT DETECTED NOT DETECTED   Plesimonas shigelloides NOT DETECTED NOT DETECTED   Salmonella species NOT DETECTED NOT DETECTED   Yersinia enterocolitica NOT DETECTED NOT DETECTED   Vibrio species NOT DETECTED NOT DETECTED   Vibrio cholerae NOT DETECTED NOT DETECTED   Enteroaggregative E coli (EAEC) NOT DETECTED NOT DETECTED   Enteropathogenic E coli (EPEC) NOT DETECTED NOT DETECTED   Enterotoxigenic E coli (ETEC) NOT DETECTED NOT DETECTED   Shiga like toxin producing E coli (STEC) NOT DETECTED NOT DETECTED   Shigella/Enteroinvasive E coli (EIEC) NOT DETECTED NOT DETECTED   Cryptosporidium NOT DETECTED NOT DETECTED   Cyclospora cayetanensis NOT DETECTED NOT DETECTED   Entamoeba histolytica NOT DETECTED NOT DETECTED   Giardia lamblia NOT DETECTED NOT DETECTED   Adenovirus F40/41 NOT DETECTED NOT DETECTED   Astrovirus NOT DETECTED NOT DETECTED   Norovirus GI/GII NOT DETECTED NOT DETECTED   Rotavirus A NOT DETECTED NOT DETECTED   Sapovirus (I, II, IV, and V) NOT DETECTED NOT DETECTED    Comment: Performed at [facility], [address] [place], [place] [address]  Osmolality     Status: None   Collection Time: 07/18/20  9:07 AM  Result Value Ref Range   Osmolality 289 275 - 295 mOsm/kg    Comment: Performed at [facility], [address] [place], [place] [address]  Lipase, blood     Status: Abnormal   Collection Time: 07/18/20  9:07 AM  Result Value Ref Range   Lipase 80 (H) 11 - 51 U/L    Comment: Performed at [facility], [address] [place], [place] [address]  Basic  metabolic panel     Status: Abnormal   Collection Time: 07/18/20  9:07 AM  Result Value Ref Range   Sodium 131 (L) 135 - 145 mmol/L   Potassium 4.5 3.5 - 5.1 mmol/L   Chloride 105 98 - 111 mmol/L   CO2 15 (L) 22 - 32 mmol/L   Glucose, Bld 101 (H) 70 - 99 mg/dL    Comment: Glucose reference range applies only to samples taken after fasting for at least 8 hours.   BUN 50 (H) 8 - 23 mg/dL   Creatinine, Ser 2.04 (H) 0.61 - 1.24 mg/dL   Calcium 9.2 8.9 - 10.3 mg/dL   GFR, Estimated 36 (L) >60 mL/min    Comment: (NOTE) Calculated using the CKD-EPI Creatinine Equation (2021)    Anion gap 11 5 - 15    Comment: Performed at [facility], [address] [place], [place] [address]  CBC     Status: Abnormal   Collection Time: 07/18/20  9:07 AM  Result Value Ref Range   WBC 6.7 4.0 - 10.5 K/uL   RBC 4.62 4.22 - 5.81 MIL/uL   Hemoglobin  13.6 13.0 - 17.0 g/dL   HCT 38.9 (L) 39.0 - 52.0 %   MCV 84.2 80.0 - 100.0 fL   MCH 29.4 26.0 - 34.0 pg   MCHC 35.0 30.0 - 36.0 g/dL   RDW 13.2 11.5 - 15.5 %   Platelets 330 150 - 400 K/uL   nRBC 0.0 0.0 - 0.2 %    Comment: Performed at [facility], [address] [place], [place] [address]  Magnesium     Status: None   Collection Time: 07/18/20  9:07 AM  Result Value Ref Range   Magnesium 1.9 1.7 - 2.4 mg/dL    Comment: Performed at [facility], [address] [place], [place] [address]  Phosphorus     Status: None   Collection Time: 07/18/20  9:07 AM  Result Value Ref Range   Phosphorus 3.8 2.5 - 4.6 mg/dL    Comment: Performed at [facility], [address] [place], [place] [address]  Lipase, blood     Status: Abnormal   Collection Time: 07/19/20  4:28 AM  Result Value Ref Range   Lipase 77 (H) 11 - 51 U/L    Comment: Performed at [facility], [address] [place], [place] [address]  Basic metabolic panel     Status: Abnormal   Collection Time: 07/19/20  4:28 AM  Result Value Ref Range    Sodium 134 (L) 135 - 145 mmol/L   Potassium 4.1 3.5 - 5.1 mmol/L   Chloride 110 98 - 111 mmol/L   CO2 16 (L) 22 - 32 mmol/L   Glucose, Bld 90 70 - 99 mg/dL    Comment: Glucose reference range applies only to samples taken after fasting for at least 8 hours.   BUN 28 (H) 8 - 23 mg/dL   Creatinine, Ser 1.42 (H) 0.61 - 1.24 mg/dL   Calcium 8.4 (L) 8.9 - 10.3 mg/dL   GFR, Estimated 56 (L) >60 mL/min    Comment: (NOTE) Calculated using the CKD-EPI Creatinine Equation (2021)    Anion gap 8 5 - 15    Comment: Performed at [facility], [address] [place],  [address]  CBC     Status: Abnormal   Collection Time: 07/19/20  4:28 AM  Result Value Ref Range   WBC 6.6 4.0 - 10.5 K/uL   RBC 4.08 (L) 4.22 - 5.81 MIL/uL   Hemoglobin 12.1 (L) 13.0 - 17.0 g/dL   HCT 34.7 (L) 39.0 - 52.0 %   MCV 85.0 80.0 - 100.0 fL   MCH 29.7 26.0 - 34.0 pg   MCHC 34.9 30.0 - 36.0 g/dL   RDW 13.3 11.5 - 15.5 %   Platelets 296 150 - 400 K/uL   nRBC 0.0 0.0 - 0.2 %    Comment: Performed at [facility], [address] [place],  [address]  Hepatic function panel     Status: Abnormal   Collection Time: 07/19/20  4:28 AM  Result Value Ref Range   Total Protein 6.2 (L) 6.5 - 8.1 g/dL   Albumin 3.2 (L) 3.5 - 5.0 g/dL   AST 15 15 - 41 U/L   ALT 28 0 - 44 U/L   Alkaline Phosphatase 47 38 - 126 U/L   Total Bilirubin 0.7 0.3 - 1.2 mg/dL   Bilirubin, Direct <0.1 0.0 - 0.2 mg/dL   Indirect Bilirubin NOT CALCULATED 0.3 - 0.9 mg/dL    Comment: Performed at [facility], [place]  [address], [place], [place] [address]  Lipase     Status: Abnormal   Collection Time: 07/25/20  8:33 AM  Result Value Ref Range   Lipase 77 (H) 7 - 60 U/L  COMPLETE METABOLIC PANEL WITH GFR     Status: Abnormal   Collection Time: 07/25/20  8:33 AM  Result Value Ref Range   Glucose, Bld 98 65 - 99 mg/dL    Comment: .            Fasting reference interval .    BUN 9 7 - 25 mg/dL   Creat 1.12  0.70 - 1.25 mg/dL    Comment: For patients >24 years of age, the reference limit for Creatinine is approximately 13% higher for people identified as African-American. .    GFR, Est Non African American 70 > OR = 60 mL/min/1.70m   GFR, Est African American 81 > OR = 60 mL/min/1.721m  BUN/Creatinine Ratio NOT APPLICABLE 6 - 22 (calc)   Sodium 138 135 - 146 mmol/L   Potassium 4.5 3.5 - 5.3 mmol/L   Chloride 103 98 - 110 mmol/L   CO2 26 20 - 32 mmol/L   Calcium 9.4 8.6 - 10.3 mg/dL   Total Protein 7.0 6.1 - 8.1 g/dL   Albumin 4.1 3.6 - 5.1 g/dL   Globulin 2.9 1.9 - 3.7 g/dL (calc)   AG Ratio 1.4 1.0 - 2.5 (calc)   Total Bilirubin 0.3 0.2 - 1.2 mg/dL   Alkaline phosphatase (APISO) 59 35 - 144 U/L   AST 36 (H) 10 - 35 U/L   ALT 68 (H) 9 - 46 U/L  CBC with Differential/Platelet     Status: Abnormal   Collection Time: 07/25/20  8:33 AM  Result Value Ref Range   WBC 9.7 3.8 - 10.8 Thousand/uL   RBC 4.58 4.20 - 5.80 Million/uL   Hemoglobin 13.0 (L) 13.2 - 17.1 g/dL   HCT 40.8 38.5 - 50.0 %   MCV 89.1 80.0 - 100.0 fL   MCH 28.4 27.0 - 33.0 pg   MCHC 31.9 (L) 32.0 - 36.0 g/dL   RDW 13.2 11.0 - 15.0 %   Platelets 376 140 - 400 Thousand/uL   MPV 10.4 7.5 - 12.5 fL   Neutro Abs 7,469 1,500 - 7,800 cells/uL   Lymphs Abs 1,416 850 - 3,900 cells/uL   Absolute Monocytes 689 200 - 950 cells/uL   Eosinophils Absolute 97 15 - 500 cells/uL   Basophils Absolute 29 0 - 200 cells/uL   Neutrophils Relative % 77 %   Total Lymphocyte 14.6 %   Monocytes Relative 7.1 %   Eosinophils Relative 1.0 %   Basophils Relative 0.3 %     PHQ2/9: [facility] 08/28/2020 07/25/2020 07/16/2020 07/25/2019 03/07/2019  Decreased Interest 0 0 0 0 0  Down, Depressed, Hopeless 0 0 0 0 0  PHQ - 2 Score 0 0 0 0 0  Altered sleeping - - - 0 0  Tired, decreased energy - - - 0 0  Change in appetite - - - 0 0  Feeling bad or failure about yourself  - - - 0 0  Trouble concentrating - - - 0 0  Moving slowly  or fidgety/restless - - - 0 0  Suicidal thoughts - - - 0 0  PHQ-9 Score - - - 0 0  Difficult doing work/chores - - - - -    phq 9 is negative   Fall Risk: Fall Risk  08/28/2020  07/25/2020 07/16/2020 01/24/2020 07/25/2019  Falls in the past year? 0 0 0 0 0  Number falls in past yr: 0 0 0 0 0  Injury with Fall? 0 0 0 0 0  Follow up - - - - -     Functional Status Survey: Is the patient deaf or have difficulty hearing?: No Does the patient have difficulty seeing, even when wearing glasses/contacts?: No Does the patient have difficulty concentrating, remembering, or making decisions?: No Does the patient have difficulty walking or climbing stairs?: No Does the patient have difficulty dressing or bathing?: No Does the patient have difficulty doing errands alone such as visiting a doctor's office or shopping?: No    Assessment & Plan  1. Atherosclerosis of abdominal aorta (HCC)  - Lipid panel  2. Hypertension, benign  - COMPLETE METABOLIC PANEL WITH GFR - CBC with Differential/Platelet  3. Hyperlipidemia LDL goal <130  - Lipid panel  4. Elevated lipase  - Lipase Keep follow up with gastroenterologist   5. Primary insomnia   6. Anemia, unspecified type  - CBC with Differential/Platelet  7. Other male erectile dysfunction  - tadalafil (CIALIS) 20 MG tablet; Take 1 tablet (20 mg total) by mouth daily as needed for erectile dysfunction.  Dispense: 30 tablet; Refill: 1  8. Renal cyst, right

## 2020-08-29 LAB — CBC WITH DIFFERENTIAL/PLATELET
Absolute Monocytes: 585 cells/uL (ref 200–950)
Basophils Absolute: 32 cells/uL (ref 0–200)
Basophils Relative: 0.4 %
Eosinophils Absolute: 119 cells/uL (ref 15–500)
Eosinophils Relative: 1.5 %
HCT: 39 % (ref 38.5–50.0)
Hemoglobin: 12.6 g/dL — ABNORMAL LOW (ref 13.2–17.1)
Lymphs Abs: 1304 cells/uL (ref 850–3900)
MCH: 28.4 pg (ref 27.0–33.0)
MCHC: 32.3 g/dL (ref 32.0–36.0)
MCV: 87.8 fL (ref 80.0–100.0)
MPV: 10.2 fL (ref 7.5–12.5)
Monocytes Relative: 7.4 %
Neutro Abs: 5862 cells/uL (ref 1500–7800)
Neutrophils Relative %: 74.2 %
Platelets: 259 10*3/uL (ref 140–400)
RBC: 4.44 10*6/uL (ref 4.20–5.80)
RDW: 12.7 % (ref 11.0–15.0)
Total Lymphocyte: 16.5 %
WBC: 7.9 10*3/uL (ref 3.8–10.8)

## 2020-08-29 LAB — COMPLETE METABOLIC PANEL WITH GFR
AG Ratio: 1.3 (calc) (ref 1.0–2.5)
ALT: 40 U/L (ref 9–46)
AST: 25 U/L (ref 10–35)
Albumin: 4 g/dL (ref 3.6–5.1)
Alkaline phosphatase (APISO): 67 U/L (ref 35–144)
BUN: 10 mg/dL (ref 7–25)
CO2: 25 mmol/L (ref 20–32)
Calcium: 9.3 mg/dL (ref 8.6–10.3)
Chloride: 107 mmol/L (ref 98–110)
Creat: 1.1 mg/dL (ref 0.70–1.25)
GFR, Est African American: 83 mL/min/{1.73_m2} (ref >=60)
GFR, Est Non African American: 72 mL/min/{1.73_m2} (ref >=60)
Globulin: 3.2 g/dL (calc) (ref 1.9–3.7)
Glucose, Bld: 99 mg/dL (ref 65–99)
Potassium: 4.1 mmol/L (ref 3.5–5.3)
Sodium: 142 mmol/L (ref 135–146)
Total Bilirubin: 0.3 mg/dL (ref 0.2–1.2)
Total Protein: 7.2 g/dL (ref 6.1–8.1)

## 2020-08-29 LAB — LIPID PANEL
Cholesterol: 128 mg/dL (ref <200)
HDL: 43 mg/dL (ref >=40)
LDL Cholesterol (Calc): 70 mg/dL (calc)
Non-HDL Cholesterol (Calc): 85 mg/dL (calc) (ref <130)
Total CHOL/HDL Ratio: 3 (calc) (ref <5.0)
Triglycerides: 70 mg/dL (ref <150)

## 2020-08-29 LAB — LIPASE: Lipase: 32 U/L (ref 7–60)

## 2020-09-04 ENCOUNTER — Encounter: Payer: Self-pay | Admitting: Gastroenterology

## 2020-09-04 ENCOUNTER — Other Ambulatory Visit: Payer: Self-pay

## 2020-09-04 ENCOUNTER — Ambulatory Visit: Payer: Managed Care, Other (non HMO) | Admitting: Gastroenterology

## 2020-09-04 VITALS — BP 202/93 | HR 109 | Temp 98.0°F | Ht 72.0 in | Wt 181.4 lb

## 2020-09-04 DIAGNOSIS — Z86010 Personal history of colonic polyps: Secondary | ICD-10-CM

## 2020-09-04 DIAGNOSIS — Z1211 Encounter for screening for malignant neoplasm of colon: Secondary | ICD-10-CM

## 2020-09-04 MED ORDER — NA SULFATE-K SULFATE-MG SULF 17.5-3.13-1.6 GM/177ML PO SOLN: 354.0000 mL | Freq: Once | ORAL | 0 refills | Status: AC

## 2020-09-04 NOTE — Progress Notes (Signed)
Cephas Darby, MD [address]  [place]  [place], [place] [address]  Main: [phone]  Fax: [phone]    Gastroenterology Consultation  Referring Provider:     Steele Sizer, MD Primary Care Physician:  Steele Sizer, MD Primary Gastroenterologist:  Dr. Cephas Darby Reason for Consultation:     Nausea, vomiting and diarrhea        HPI:   Charles Valentine is a 65 y.o. male referred by Dr. Steele Sizer, MD  for consultation & management of acute episode of nausea, vomiting and diarrhea.  Patient was admitted to [facility] on 07/19/2020 secondary to 5 days of nausea, vomiting and nonbloody diarrhea.  He underwent stool studies, negative for infection including C. difficile.  Patient was conservatively managed with IV fluids, diet was advanced and was discharged home.  Patient's blood pressure is elevated in office today because he has not restarted his blood pressure medications and he said his PCP has been trying to wean off his blood pressure medication since it was previously low.  He is on Bystolic.  He has not taken Bystolic in last 3 days.  Patient denies any chest pain, shortness of breath, lightheadedness.  Patient denies any GI symptoms.  He is more interested to undergo colonoscopy given his history of colon polyps that were removed more than 20 years ago Patient is accompanied by his wife today.  Patient denies smoking or alcohol use  NSAIDs: None  Antiplts/Anticoagulants/Anti thrombotics: None  GI Procedures: Colonoscopy in his 4s, polyps were removed, had another colonoscopy within 1 to 2 years after his initial colonoscopy  Past Medical History:  Diagnosis Date  . AKI (acute kidney injury) ([facility]) 07/17/2020  . ED (erectile dysfunction)   . History of colon polyps   . Hyperlipidemia   . Hypertension   . Insomnia     History reviewed. No pertinent surgical history.  Current Outpatient Medications:  .  Na Sulfate-K Sulfate-Mg Sulf 17.5-3.13-1.6  GM/177ML SOLN, Take 354 mLs by mouth once for 1 dose., Disp: 354 mL, Rfl: 0 .  pantoprazole (PROTONIX) 40 MG tablet, Take 1 tablet (40 mg total) by mouth daily., Disp: 30 tablet, Rfl: 11 .  rosuvastatin (CRESTOR) 40 MG tablet, Take 0.5 tablets (20 mg total) by mouth daily., Disp: 90 tablet, Rfl: 0 .  tadalafil (CIALIS) 20 MG tablet, Take 1 tablet (20 mg total) by mouth daily as needed for erectile dysfunction., Disp: 30 tablet, Rfl: 1   Family History  Problem Relation Age of Onset  . Hypertension Mother   . Diabetes Mother   . Heart disease Mother 45       CABG  . Heart attack Mother 34  . Obesity Sister   . Drug abuse Brother      Social History   Tobacco Use  . Smoking status: Former Smoker    Packs/day: 0.50    Years: 10.00    Pack years: 5.00    Types: Cigarettes    Quit date: 1989    Years since quitting: 33.3  . Smokeless tobacco: Never Used  Vaping Use  . Vaping Use: Never used  Substance Use Topics  . Alcohol use: Yes    Alcohol/week: 8.0 standard drinks    Types: 6 Cans of beer, 2 Shots of liquor per week  . Drug use: No    Allergies as of 09/04/2020  . (No Known Allergies)    Review of Systems:    All systems reviewed and negative except where  noted in HPI.   Physical Exam:  BP (!) 202/93 (BP Location: Left Arm, Patient Position: Sitting, Cuff Size: Normal)   Pulse (!) 109   Temp 98 F (36.7 C) (Oral)   Ht 6' (1.829 m)   Wt 181 lb 6 oz (82.3 kg)   BMI 24.60 kg/m  No LMP for male patient.  General:   Alert,  Well-developed, well-nourished, pleasant and cooperative in NAD Head:  Normocephalic and atraumatic. Eyes:  Sclera clear, no icterus.   Conjunctiva pink. Ears:  Normal auditory acuity. Nose:  No deformity, discharge, or lesions. Mouth:  No deformity or lesions,oropharynx pink & moist. Neck:  Supple; no masses or thyromegaly. Lungs:  Respirations even and unlabored.  Clear throughout to auscultation.   No wheezes, crackles, or rhonchi. No  acute distress. Heart:  Regular rate and rhythm; no murmurs, clicks, rubs, or gallops. Abdomen:  Normal bowel sounds. Soft, non-tender and non-distended without masses, hepatosplenomegaly or hernias noted.  No guarding or rebound tenderness.   Rectal: Not performed Msk:  Symmetrical without gross deformities. Good, equal movement & strength bilaterally. Pulses:  Normal pulses noted. Extremities:  No clubbing or edema.  No cyanosis. Neurologic:  Alert and oriented x3;  grossly normal neurologically. Skin:  Intact without significant lesions or rashes. No jaundice. Psych:  Alert and cooperative. Normal mood and affect.  Imaging Studies: Reviewed  Assessment and Plan:   Charles Valentine is a 65 y.o. male with history of hypertension, personal history of colon polyps, sigmoid diverticulosis with recent hospital admission for nausea, vomiting and diarrhea, most likely viral gastroenteritis.  No evidence of infection based on stool studies.  Patient is currently asymptomatic  Personal history of colon polyps Recommend colonoscopy  Hypertension Patient has asymptomatic elevated blood pressure in office today due to lack of taking medication Advised him to take Bystolic after going home.  He said he has BP monitor at home and advised him to check and follow-up with PCP Discussed about low-sodium diet, information provided   Follow up as needed   Cephas Darby, MD

## 2020-09-04 NOTE — Patient Instructions (Signed)
Low-Sodium Eating Plan Sodium, which is an element that makes up salt, helps you maintain a healthy balance of fluids in your body. Too much sodium can increase your blood pressure and cause fluid and waste to be held in your body. Your health care provider or dietitian may recommend following this plan if you have high blood pressure (hypertension), kidney disease, liver disease, or heart failure. Eating less sodium can help lower your blood pressure, reduce swelling, and protect your heart, liver, and kidneys. What are tips for following this plan? Reading food labels  The Nutrition Facts label lists the amount of sodium in one serving of the food. If you eat more than one serving, you must multiply the listed amount of sodium by the number of servings.  Choose foods with less than 140 mg of sodium per serving.  Avoid foods with 300 mg of sodium or more per serving. Shopping  Look for lower-sodium products, often labeled as "low-sodium" or "no salt added."  Always check the sodium content, even if foods are labeled as "unsalted" or "no salt added."  Buy fresh foods. ? Avoid canned foods and pre-made or frozen meals. ? Avoid canned, cured, or processed meats.  Buy breads that have less than 80 mg of sodium per slice.   Cooking  Eat more home-cooked food and less restaurant, buffet, and fast food.  Avoid adding salt when cooking. Use salt-free seasonings or herbs instead of table salt or sea salt. Check with your health care provider or pharmacist before using salt substitutes.  Cook with plant-based oils, such as canola, sunflower, or olive oil.   Meal planning  When eating at a restaurant, ask that your food be prepared with less salt or no salt, if possible. Avoid dishes labeled as brined, pickled, cured, smoked, or made with soy sauce, miso, or teriyaki sauce.  Avoid foods that contain MSG (monosodium glutamate). MSG is sometimes added to Chinese food, bouillon, and some canned  foods.  Make meals that can be grilled, baked, poached, roasted, or steamed. These are generally made with less sodium. General information Most people on this plan should limit their sodium intake to 1,500-2,000 mg (milligrams) of sodium each day. What foods should I eat? Fruits Fresh, frozen, or canned fruit. Fruit juice. Vegetables Fresh or frozen vegetables. "No salt added" canned vegetables. "No salt added" tomato sauce and paste. Low-sodium or reduced-sodium tomato and vegetable juice. Grains Low-sodium cereals, including oats, puffed wheat and rice, and shredded wheat. Low-sodium crackers. Unsalted rice. Unsalted pasta. Low-sodium bread. Whole-grain breads and whole-grain pasta. Meats and other proteins Fresh or frozen (no salt added) meat, poultry, seafood, and fish. Low-sodium canned tuna and salmon. Unsalted nuts. Dried peas, beans, and lentils without added salt. Unsalted canned beans. Eggs. Unsalted nut butters. Dairy Milk. Soy milk. Cheese that is naturally low in sodium, such as ricotta cheese, fresh mozzarella, or Swiss cheese. Low-sodium or reduced-sodium cheese. Cream cheese. Yogurt. Seasonings and condiments Fresh and dried herbs and spices. Salt-free seasonings. Low-sodium mustard and ketchup. Sodium-free salad dressing. Sodium-free light mayonnaise. Fresh or refrigerated horseradish. Lemon juice. Vinegar. Other foods Homemade, reduced-sodium, or low-sodium soups. Unsalted popcorn and pretzels. Low-salt or salt-free chips. The items listed above may not be a complete list of foods and beverages you can eat. Contact a dietitian for more information. What foods should I avoid? Vegetables Sauerkraut, pickled vegetables, and relishes. Olives. French fries. Onion rings. Regular canned vegetables (not low-sodium or reduced-sodium). Regular canned tomato sauce and paste (not low-sodium   or reduced-sodium). Regular tomato and vegetable juice (not low-sodium or reduced-sodium). Frozen  vegetables in sauces. Grains Instant hot cereals. Bread stuffing, pancake, and biscuit mixes. Croutons. Seasoned rice or pasta mixes. Noodle soup cups. Boxed or frozen macaroni and cheese. Regular salted crackers. Self-rising flour. Meats and other proteins Meat or fish that is salted, canned, smoked, spiced, or pickled. Precooked or cured meat, such as sausages or meat loaves. Bacon. Ham. Pepperoni. Hot dogs. Corned beef. Chipped beef. Salt pork. Jerky. Pickled herring. Anchovies and sardines. Regular canned tuna. Salted nuts. Dairy Processed cheese and cheese spreads. Hard cheeses. Cheese curds. Blue cheese. Feta cheese. String cheese. Regular cottage cheese. Buttermilk. Canned milk. Fats and oils Salted butter. Regular margarine. Ghee. Bacon fat. Seasonings and condiments Onion salt, garlic salt, seasoned salt, table salt, and sea salt. Canned and packaged gravies. Worcestershire sauce. Tartar sauce. Barbecue sauce. Teriyaki sauce. Soy sauce, including reduced-sodium. Steak sauce. Fish sauce. Oyster sauce. Cocktail sauce. Horseradish that you find on the shelf. Regular ketchup and mustard. Meat flavorings and tenderizers. Bouillon cubes. Hot sauce. Pre-made or packaged marinades. Pre-made or packaged taco seasonings. Relishes. Regular salad dressings. Salsa. Other foods Salted popcorn and pretzels. Corn chips and puffs. Potato and tortilla chips. Canned or dried soups. Pizza. Frozen entrees and pot pies. The items listed above may not be a complete list of foods and beverages you should avoid. Contact a dietitian for more information. Summary  Eating less sodium can help lower your blood pressure, reduce swelling, and protect your heart, liver, and kidneys.  Most people on this plan should limit their sodium intake to 1,500-2,000 mg (milligrams) of sodium each day.  Canned, boxed, and frozen foods are high in sodium. Restaurant foods, fast foods, and pizza are also very high in sodium. You  also get sodium by adding salt to food.  Try to cook at home, eat more fresh fruits and vegetables, and eat less fast food and canned, processed, or prepared foods. This information is not intended to replace advice given to you by your health care provider. Make sure you discuss any questions you have with your health care provider. Document Revised: 05/12/2019 Document Reviewed: 03/08/2019 Elsevier Patient Education  2021 Elsevier Inc.  

## 2020-09-17 ENCOUNTER — Encounter: Payer: Self-pay | Admitting: Gastroenterology

## 2020-09-18 ENCOUNTER — Encounter
Admission: RE | Disposition: A | Discharge status: Home / Self Care | Payer: Self-pay | Source: Home / Self Care | Attending: Gastroenterology

## 2020-09-18 ENCOUNTER — Ambulatory Visit: Payer: Managed Care, Other (non HMO) | Admitting: Certified Registered Nurse Anesthetist

## 2020-09-18 ENCOUNTER — Ambulatory Visit
Admission: RE | Admit: 2020-09-18 | Discharge: 2020-09-18 | Disposition: A | Discharge status: Home / Self Care | Payer: Managed Care, Other (non HMO) | Attending: Gastroenterology | Admitting: Gastroenterology

## 2020-09-18 DIAGNOSIS — D125 Benign neoplasm of sigmoid colon: Secondary | ICD-10-CM | POA: Diagnosis not present

## 2020-09-18 DIAGNOSIS — N179 Acute kidney failure, unspecified: Secondary | ICD-10-CM | POA: Diagnosis not present

## 2020-09-18 DIAGNOSIS — D124 Benign neoplasm of descending colon: Secondary | ICD-10-CM | POA: Diagnosis not present

## 2020-09-18 DIAGNOSIS — K644 Residual hemorrhoidal skin tags: Secondary | ICD-10-CM | POA: Diagnosis not present

## 2020-09-18 DIAGNOSIS — G4700 Insomnia, unspecified: Secondary | ICD-10-CM | POA: Diagnosis not present

## 2020-09-18 DIAGNOSIS — I10 Essential (primary) hypertension: Secondary | ICD-10-CM | POA: Insufficient documentation

## 2020-09-18 DIAGNOSIS — Z8349 Family history of other endocrine, nutritional and metabolic diseases: Secondary | ICD-10-CM | POA: Diagnosis not present

## 2020-09-18 DIAGNOSIS — K635 Polyp of colon: Secondary | ICD-10-CM | POA: Diagnosis not present

## 2020-09-18 DIAGNOSIS — E785 Hyperlipidemia, unspecified: Secondary | ICD-10-CM | POA: Diagnosis not present

## 2020-09-18 DIAGNOSIS — K5730 Diverticulosis of large intestine without perforation or abscess without bleeding: Secondary | ICD-10-CM | POA: Diagnosis not present

## 2020-09-18 DIAGNOSIS — Z87891 Personal history of nicotine dependence: Secondary | ICD-10-CM | POA: Diagnosis not present

## 2020-09-18 DIAGNOSIS — Z1211 Encounter for screening for malignant neoplasm of colon: Secondary | ICD-10-CM

## 2020-09-18 DIAGNOSIS — Z79899 Other long term (current) drug therapy: Secondary | ICD-10-CM | POA: Insufficient documentation

## 2020-09-18 HISTORY — PX: COLONOSCOPY WITH PROPOFOL: SHX5780

## 2020-09-18 SURGERY — COLONOSCOPY WITH PROPOFOL
Anesthesia: General

## 2020-09-18 MED ORDER — PROPOFOL 10 MG/ML IV BOLUS
INTRAVENOUS | Status: DC | PRN
  Administered 2020-09-18: 80 mg via INTRAVENOUS

## 2020-09-18 MED ORDER — PROPOFOL 500 MG/50ML IV EMUL
INTRAVENOUS | Status: AC
  Filled 2020-09-18: qty 50

## 2020-09-18 MED ORDER — SPOT INK MARKER SYRINGE KIT
PACK | SUBMUCOSAL | Status: DC | PRN
  Administered 2020-09-18: 2 mL via SUBMUCOSAL
  Administered 2020-09-18: 1 mL via SUBMUCOSAL

## 2020-09-18 MED ORDER — SODIUM CHLORIDE 0.9 % IV SOLN
INTRAVENOUS | Status: DC
  Administered 2020-09-18: 20 mL/h via INTRAVENOUS

## 2020-09-18 MED ORDER — EPHEDRINE SULFATE 50 MG/ML IJ SOLN
INTRAMUSCULAR | Status: DC | PRN
  Administered 2020-09-18 (×3): 10 mg via INTRAVENOUS

## 2020-09-18 MED ORDER — EPHEDRINE 5 MG/ML INJ
INTRAVENOUS | Status: AC
  Filled 2020-09-18: qty 10

## 2020-09-18 MED ORDER — PROPOFOL 500 MG/50ML IV EMUL
INTRAVENOUS | Status: DC | PRN
  Administered 2020-09-18: 180 ug/kg/min via INTRAVENOUS

## 2020-09-18 NOTE — Anesthesia Postprocedure Evaluation (Signed)
Anesthesia Post Note  Patient: Charles Valentine  Procedure(s) Performed: COLONOSCOPY WITH PROPOFOL (N/A )  Patient location during evaluation: PACU Anesthesia Type: General Level of consciousness: awake and alert Pain management: pain level controlled Vital Signs Assessment: post-procedure vital signs reviewed and stable Respiratory status: spontaneous breathing and respiratory function stable Cardiovascular status: stable Anesthetic complications: no   No complications documented.   Last Vitals:  Vitals:   09/18/20 0835 09/18/20 1019  BP: (!) 202/101 121/69  Pulse: 65   Resp: 18   Temp: (!) 35.9 C (!) 35.6 C  SpO2: 100%     Last Pain:  Vitals:   09/18/20 1049  TempSrc:   PainSc: 0-No pain                 Margit Batte K

## 2020-09-18 NOTE — H&P (Signed)
Cephas Darby, MD [address]  [place]  [place], [place] [address]  Main: [phone]  Fax: [phone] Pager: [phone]  Primary Care Physician:  Steele Sizer, MD Primary Gastroenterologist:  Dr. Cephas Darby  Pre-Procedure History & Physical: HPI:  Charles Valentine is a 65 y.o. male is here for an colonoscopy.   Past Medical History:  Diagnosis Date  . AKI (acute kidney injury) ([facility]) 07/17/2020  . ED (erectile dysfunction)   . History of colon polyps   . Hyperlipidemia   . Hypertension   . Insomnia     History reviewed. No pertinent surgical history.  Prior to Admission medications   Medication Sig Start Date End Date Taking? Authorizing Provider  pantoprazole (PROTONIX) 40 MG tablet Take 1 tablet (40 mg total) by mouth daily. 07/19/20 07/19/21 Yes Val Riles, MD  rosuvastatin (CRESTOR) 40 MG tablet Take 0.5 tablets (20 mg total) by mouth daily. 07/25/20  Yes Sowles, Drue Stager, MD  tadalafil (CIALIS) 20 MG tablet Take 1 tablet (20 mg total) by mouth daily as needed for erectile dysfunction. 08/28/20  Yes Steele Sizer, MD    Allergies as of 09/04/2020  . (No Known Allergies)    Family History  Problem Relation Age of Onset  . Hypertension Mother   . Diabetes Mother   . Heart disease Mother 71       CABG  . Heart attack Mother 82  . Obesity Sister   . Drug abuse Brother     Social History   Socioeconomic History  . Marital status: Married    Spouse name: [name]  . Number of children: 2  . Years of education: Not on file  . Highest education level: 12th grade  Occupational History  . Occupation: Glass blower/designer    Comment: [employer]  Tobacco Use  . Smoking status: Former Smoker    Packs/day: 0.50    Years: 10.00    Pack years: 5.00    Types: Cigarettes    Quit date: 1989    Years since quitting: 33.4  . Smokeless tobacco: Never Used  Vaping Use  . Vaping Use: Never used  Substance and Sexual Activity  . Alcohol use:  Yes    Alcohol/week: 8.0 standard drinks    Types: 6 Cans of beer, 2 Shots of liquor per week  . Drug use: No  . Sexual activity: Yes    Partners: Female    Birth control/protection: None  Other Topics Concern  . Not on file  Social History Narrative   Married    He has two grown son's and 3 grandchildren   Retired October 2020    Social Determinants of Radio broadcast assistant Strain: Not on file  Food Insecurity: Not on file  Transportation Needs: Not on file  Physical Activity: Not on file  Stress: Not on file  Social Connections: Not on file  Intimate Partner Violence: Not on file    Review of Systems: See HPI, otherwise negative ROS  Physical Exam: BP (!) 202/101   Pulse 65   Temp (!) 96.6 F (35.9 C) (Temporal)   Resp 18   Ht 6' (1.829 m)   Wt 81.6 kg   SpO2 100%   BMI 24.41 kg/m  General:   Alert,  pleasant and cooperative in NAD Head:  Normocephalic and atraumatic. Neck:  Supple; no masses or thyromegaly. Lungs:  Clear throughout to auscultation.    Heart:  Regular rate and rhythm. Abdomen:  Soft, nontender and nondistended. Normal bowel sounds, without guarding, and without rebound.   Neurologic:  Alert and  oriented x4;  grossly normal neurologically.  Impression/Plan: Charles Valentine is here for an colonoscopy to be performed for colon cancer screening  Risks, benefits, limitations, and alternatives regarding  colonoscopy have been reviewed with the patient.  Questions have been answered.  All parties agreeable.   Sherri Sear, MD  09/18/2020, 9:26 AM

## 2020-09-18 NOTE — Op Note (Signed)
[facility] Gastroenterology Patient Name: Charles Valentine Procedure Date: 09/18/2020 9:10 AM MRN: [id number] Account #: [id number] Date of Birth: Feb 06, 1956 Admit Type: Outpatient Age: 64 Room: [facility] ENDO ROOM 1 Gender: Male Note Status: Finalized Procedure:             Colonoscopy Indications:           Screening for colorectal malignant neoplasm Providers:             Lin Landsman MD, MD Referring MD:          Bethena Roys. Sowles, MD (Referring MD) Medicines:             General Anesthesia Complications:         No immediate complications. Estimated blood loss: None. Procedure:             Pre-Anesthesia Assessment:                        - Prior to the procedure, a History and Physical was                         performed, and patient medications and allergies were                         reviewed. The patient is competent. The risks and                         benefits of the procedure and the sedation options and                         risks were discussed with the patient. All questions                         were answered and informed consent was obtained.                         Patient identification and proposed procedure were                         verified by the physician, the nurse, the                         anesthesiologist, the anesthetist and the technician                         in the pre-procedure area in the procedure room in the                         endoscopy suite. Mental Status Examination: alert and                         oriented. Airway Examination: normal oropharyngeal                         airway and neck mobility. Respiratory Examination:                         clear to auscultation. CV Examination: normal.  Prophylactic Antibiotics: The patient does not require                         prophylactic antibiotics. Prior Anticoagulants: The                         patient has taken no previous  anticoagulant or                         antiplatelet agents. ASA Grade Assessment: II - A                         patient with mild systemic disease. After reviewing                         the risks and benefits, the patient was deemed in                         satisfactory condition to undergo the procedure. The                         anesthesia plan was to use general anesthesia.                         Immediately prior to administration of medications,                         the patient was re-assessed for adequacy to receive                         sedatives. The heart rate, respiratory rate, oxygen                         saturations, blood pressure, adequacy of pulmonary                         ventilation, and response to care were monitored                         throughout the procedure. The physical status of the                         patient was re-assessed after the procedure.                        After obtaining informed consent, the colonoscope was                         passed under direct vision. Throughout the procedure,                         the patient's blood pressure, pulse, and oxygen                         saturations were monitored continuously. The                         Colonoscope was introduced through the anus and  advanced to the the terminal ileum, with                         identification of the appendiceal orifice and IC                         valve. The colonoscopy was performed without                         difficulty. The patient tolerated the procedure well.                         The quality of the bowel preparation was evaluated                         using the BBPS [facility] Bowel Preparation Scale) with                         scores of: Right Colon = 3, Transverse Colon = 3 and                         Left Colon = 3 (entire mucosa seen well with no                         residual staining, small fragments  of stool or opaque                         liquid). The total BBPS score equals 9. Findings:      The perianal and digital rectal examinations were normal. Pertinent       negatives include normal sphincter tone and no palpable rectal lesions.      The terminal ileum appeared normal.      A 20 mm polyp was found in the descending colon. The polyp was flat.       Preparations were made for mucosal resection. NBI using NBI       chromoendoscopy technique was done to mark the borders of the lesion.       Eleview was injected with adequate lift of the lesion from the       muscularis propria. Snare mucosal resection with suction (via the       working channel) retrieval was performed. A 20 mm area was resected.       Three pieces were resected in total. Resection and retrieval were       complete. There was no bleeding during and at the end of the procedure.       To prevent bleeding after mucosal resection, three hemostatic clips were       successfully placed (MR conditional). There was no bleeding during, or       at the end, of the procedure. Area was tattooed with an injection of       Spot (carbon black).      A 20 mm polyp was found in the sigmoid colon. The polyp was       pedunculated. The polyp was removed with a hot snare. Resection and       retrieval were complete. Area was tattooed with an injection of Spot       (carbon black).      A 6 mm  polyp was found in the sigmoid colon. The polyp was pedunculated.       The polyp was removed with a hot snare. Resection and retrieval were       complete.      Multiple diverticula were found in the recto-sigmoid colon and sigmoid       colon.      Non-bleeding external hemorrhoids were found during retroflexion. The       hemorrhoids were medium-sized. Impression:            - The examined portion of the ileum was normal.                        - One 20 mm polyp in the descending colon, removed                         with mucosal  resection. Resected and retrieved. Clips                         (MR conditional) were placed. Tattooed.                        - One 20 mm polyp in the sigmoid colon, removed with a                         hot snare. Resected and retrieved. Tattooed.                        - One 6 mm polyp in the sigmoid colon, removed with a                         hot snare. Resected and retrieved.                        - Diverticulosis in the recto-sigmoid colon and in the                         sigmoid colon.                        - Non-bleeding external hemorrhoids.                        - Mucosal resection was performed. Resection and                         retrieval were complete. Recommendation:        - Discharge patient to home (with escort).                        - Resume previous diet today.                        - Continue present medications.                        - Await pathology results.                        - Repeat colonoscopy in 1 year for surveillance after  piecemeal polypectomy. Procedure Code(s):     --- Professional ---                        [id number], Colonoscopy, flexible; with endoscopic mucosal                         resection                        45385, 2, Colonoscopy, flexible; with removal of                         tumor(s), polyp(s), or other lesion(s) by snare                         technique                        45381, 42, Colonoscopy, flexible; with directed                         submucosal injection(s), any substance Diagnosis Code(s):     --- Professional ---                        Z12.11, Encounter for screening for malignant neoplasm                         of colon                        K64.4, Residual hemorrhoidal skin tags                        K63.5, Polyp of colon                        K57.30, Diverticulosis of large intestine without                         perforation or abscess without bleeding CPT copyright  2019 American Medical Association. All rights reserved. The codes documented in this report are preliminary and upon coder review may  be revised to meet current compliance requirements. Dr. Ulyess Mort Lin Landsman MD, MD 09/18/2020 10:20:21 AM This report has been signed electronically. Number of Addenda: 0 Note Initiated On: 09/18/2020 9:10 AM Scope Withdrawal Time: 0 hours 36 minutes 36 seconds  Total Procedure Duration: 0 hours 38 minutes 9 seconds  Estimated Blood Loss:  Estimated blood loss: none.      [facility]

## 2020-09-18 NOTE — Anesthesia Preprocedure Evaluation (Signed)
Anesthesia Evaluation  Patient identified by MRN, date of birth, ID band Patient awake    Reviewed: Allergy & Precautions, NPO status , Patient's Chart, lab work & pertinent test results  History of Anesthesia Complications Negative for: history of anesthetic complications  Airway Mallampati: II       Dental   Pulmonary neg sleep apnea, neg COPD, Not current smoker, former smoker,           Cardiovascular hypertension, Pt. on medications (-) Past MI and (-) CHF (-) dysrhythmias (-) Valvular Problems/Murmurs     Neuro/Psych neg Seizures    GI/Hepatic Neg liver ROS, neg GERD  ,  Endo/Other  neg diabetes  Renal/GU negative Renal ROS     Musculoskeletal   Abdominal   Peds  Hematology   Anesthesia Other Findings   Reproductive/Obstetrics                             Anesthesia Physical Anesthesia Plan  ASA: II  Anesthesia Plan: General   Post-op Pain Management:    Induction: Intravenous  PONV Risk Score and Plan: 2 and Propofol infusion and TIVA  Airway Management Planned: Nasal Cannula  Additional Equipment:   Intra-op Plan:   Post-operative Plan:   Informed Consent: I have reviewed the patients History and Physical, chart, labs and discussed the procedure including the risks, benefits and alternatives for the proposed anesthesia with the patient or authorized representative who has indicated his/her understanding and acceptance.       Plan Discussed with:   Anesthesia Plan Comments:         Anesthesia Quick Evaluation

## 2020-09-18 NOTE — Transfer of Care (Signed)
Immediate Anesthesia Transfer of Care Note  Patient: Charles Valentine  Procedure(s) Performed: COLONOSCOPY WITH PROPOFOL (N/A )  Patient Location: PACU  Anesthesia Type:General  Level of Consciousness: drowsy  Airway & Oxygen Therapy: Patient Spontanous Breathing  Post-op Assessment: Report given to RN and Post -op Vital signs reviewed and stable  Post vital signs: Reviewed and stable  Last Vitals:  Vitals Value Taken Time  BP 121/69 09/18/20 1019  Temp 35.6 C 09/18/20 1019  Pulse 61 09/18/20 1019  Resp 15 09/18/20 1019  SpO2 99 % 09/18/20 1019  Vitals shown include unvalidated device data.  Last Pain:  Vitals:   09/18/20 1019  TempSrc: Temporal  PainSc:          Complications: No complications documented.

## 2020-09-19 ENCOUNTER — Encounter: Payer: Self-pay | Admitting: Gastroenterology

## 2020-09-20 ENCOUNTER — Encounter: Payer: Self-pay | Admitting: Gastroenterology

## 2020-09-20 LAB — SURGICAL PATHOLOGY

## 2020-12-05 ENCOUNTER — Encounter: Payer: Self-pay | Admitting: Family Medicine

## 2020-12-06 ENCOUNTER — Other Ambulatory Visit: Payer: Self-pay | Admitting: Family Medicine

## 2020-12-06 MED ORDER — NEBIVOLOL HCL 20 MG PO TABS: 20.0000 mg | ORAL_TABLET | Freq: Every day | ORAL | 0 refills | Status: DC

## 2021-03-03 NOTE — Progress Notes (Signed)
Name: Charles Valentine   MRN: [id number]    DOB: [birth year]   Date:03/04/2021       Progress Note  Subjective  Chief Complaint  Follow Up  HPI  HTN: diagnosed in 2018, he was  Norvasc 10 mg also tried Exforge but switched  to Bystolic because of tachycardia, tolerating 20 mg daily. He has some white coat component BP spiked to over 200 when he saw GI, he was only taking 10 mg but he went back on the 20 mg . No chest pain or palpitation. BP at home has been 130's/80's  Echo 08/2016 :  - Left ventricle: The cavity size was normal. Wall thickness was   normal. Systolic function was normal. The estimated ejection   fraction was in the range of 60% to 65%. Wall motion was normal;   there were no regional wall motion abnormalities. Left   ventricular diastolic function parameters were normal.   Hyperlipidemia/Atherosclerosis aorta : plaques found on CT done 06/2020  he states he stopped taking Crestor months ago, he states he does not like taking pills, discussed risk of heart attacks and strokes and he is wiling to resume medication   ED: he is taking Cialis occasionally,doing well on medication, no side effects . He needs a refill today   Colon polyps: recent colonoscopy showed atypia and will go back next year     Insomnia:he states retired last year, and is sleeping well, takes medication very seldom. Unchanged   Renal cyst: during imaging for diarrhea he was found to have a renal cyst, US showed possible complex cyst, but MRI was benign - unchanged   MRI abdomen done 08/09/2020:  2.3 x 1.6 cm cyst with a single thin septation in the posterior interpolar right kidney (series 19/image 22). No solid component or enhancement following contrast administration. 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  Patient Active Problem List   Diagnosis Date Noted   Screening for colon cancer    Atherosclerosis of abdominal aorta ([place]) 07/25/2020   Diverticulosis 07/25/2020    Sinus tachycardia 09/02/2016   Essential hypertension 09/02/2016   Eczema 06/08/2016   Hyperlipidemia LDL goal <130 04/08/2016   ED (erectile dysfunction)     Past Surgical History:  Procedure Laterality Date   COLONOSCOPY WITH PROPOFOL N/A 09/18/2020   Procedure: COLONOSCOPY WITH PROPOFOL;  Surgeon: Lin Landsman, MD;  Location: [facility];  Service: Gastroenterology;  Laterality: N/A;    Family History  Problem Relation Age of Onset   Hypertension Mother    Diabetes Mother    Heart disease Mother 68       CABG   Heart attack Mother 57   Obesity Sister    Drug abuse Brother     Social History   Tobacco Use   Smoking status: Former    Packs/day: 0.50    Years: 10.00    Pack years: 5.00    Types: Cigarettes    Quit date: 1989    Years since quitting: 33.8   Smokeless tobacco: Never  Substance Use Topics   Alcohol use: Yes    Alcohol/week: 8.0 standard drinks    Types: 6 Cans of beer, 2 Shots of liquor per week     Current Outpatient Medications:    Nebivolol HCl 20 MG TABS, Take 1 tablet (20 mg total) by mouth daily., Disp: 90 tablet, Rfl: 0   pantoprazole (PROTONIX) 40 MG tablet, Take 1 tablet (40 mg total) by mouth daily., Disp:  30 tablet, Rfl: 11   rosuvastatin (CRESTOR) 40 MG tablet, Take 0.5 tablets (20 mg total) by mouth daily., Disp: 90 tablet, Rfl: 0   tadalafil (CIALIS) 20 MG tablet, Take 1 tablet (20 mg total) by mouth daily as needed for erectile dysfunction., Disp: 30 tablet, Rfl: 1  No Known Allergies  I personally reviewed active problem list, medication list, allergies, family history, social history, health maintenance with the patient/caregiver today.   ROS  Constitutional: Negative for fever or weight change.  Respiratory: Negative for cough and shortness of breath.   Cardiovascular: Negative for chest pain or palpitations.  Gastrointestinal: Negative for abdominal pain, no bowel changes.  Musculoskeletal: Negative for gait problem  or joint swelling.  Skin: Negative for rash.  Neurological: Negative for dizziness or headache.  No other specific complaints in a complete review of systems (except as listed in HPI above).   Objective  Vitals:   03/04/21 0742  BP: 138/86  Pulse: 67  Resp: 16  Temp: 98 F (36.7 C)  SpO2: 98%  Weight: 181 lb (82.1 kg)  Height: 6' (1.829 m)    Body mass index is 24.55 kg/m.  Physical Exam  Constitutional: Patient appears well-developed and well-nourished. No distress.  HEENT: head atraumatic, normocephalic, pupils equal and reactive to light, neck supple Cardiovascular: Normal rate, regular rhythm and normal heart sounds.  No murmur heard. No BLE edema. Pulmonary/Chest: Effort normal and breath sounds normal. No respiratory distress. Abdominal: Soft.  There is no tenderness. Psychiatric: Patient has a normal mood and affect. behavior is normal. Judgment and thought content normal.    PHQ2/9: Depression screen [facility] 2/9 03/04/2021 08/28/2020 07/25/2020 07/16/2020 07/25/2019  Decreased Interest 0 0 0 0 0  Down, Depressed, Hopeless 0 0 0 0 0  PHQ - 2 Score 0 0 0 0 0  Altered sleeping 0 - - - 0  Tired, decreased energy 0 - - - 0  Change in appetite 0 - - - 0  Feeling bad or failure about yourself  0 - - - 0  Trouble concentrating 0 - - - 0  Moving slowly or fidgety/restless 0 - - - 0  Suicidal thoughts 0 - - - 0  PHQ-9 Score 0 - - - 0  Difficult doing work/chores - - - - -  Some recent data might be hidden    phq 9 is negative   Fall Risk: Fall Risk  03/04/2021 08/28/2020 07/25/2020 07/16/2020 01/24/2020  Falls in the past year? 0 0 0 0 0  Number falls in past yr: 0 0 0 0 0  Injury with Fall? 0 0 0 0 0  Risk for fall due to : No Fall Risks - - - -  Follow up Falls prevention discussed - - - -      Functional Status Survey: Is the patient deaf or have difficulty hearing?: No Does the patient have difficulty seeing, even when wearing glasses/contacts?: No Does the patient  have difficulty concentrating, remembering, or making decisions?: No Does the patient have difficulty walking or climbing stairs?: No Does the patient have difficulty dressing or bathing?: No Does the patient have difficulty doing errands alone such as visiting a doctor's office or shopping?: No    Assessment & Plan  1. Atherosclerosis of abdominal aorta (HCC)  - rosuvastatin (CRESTOR) 20 MG tablet; Take 1 tablet (20 mg total) by mouth daily.  Dispense: 90 tablet; Refill: 1  2. Hypertension, benign  - Nebivolol HCl 20 MG TABS; Take 1  tablet (20 mg total) by mouth daily.  Dispense: 90 tablet; Refill: 1  3. Need for immunization against influenza  - Flu Vaccine QUAD 6+ mos PF IM (Fluarix Quad PF)  4. Primary insomnia   5. Renal cyst, right   6. Multiple polyps of sigmoid colon   7. Hyperlipidemia LDL goal <130  - rosuvastatin (CRESTOR) 20 MG tablet; Take 1 tablet (20 mg total) by mouth daily.  Dispense: 90 tablet; Refill: 1  8. Other male erectile dysfunction  - tadalafil (CIALIS) 20 MG tablet; Take 1 tablet (20 mg total) by mouth daily as needed for erectile dysfunction.  Dispense: 30 tablet; Refill: 1

## 2021-03-04 ENCOUNTER — Other Ambulatory Visit: Payer: Self-pay

## 2021-03-04 ENCOUNTER — Encounter: Payer: Self-pay | Admitting: Family Medicine

## 2021-03-04 ENCOUNTER — Ambulatory Visit (INDEPENDENT_AMBULATORY_CARE_PROVIDER_SITE_OTHER): Payer: 59 | Admitting: Family Medicine

## 2021-03-04 VITALS — BP 138/86 | HR 67 | Temp 98.0°F | Resp 16 | Ht 72.0 in | Wt 181.0 lb

## 2021-03-04 DIAGNOSIS — N281 Cyst of kidney, acquired: Secondary | ICD-10-CM

## 2021-03-04 DIAGNOSIS — I10 Essential (primary) hypertension: Secondary | ICD-10-CM

## 2021-03-04 DIAGNOSIS — N528 Other male erectile dysfunction: Secondary | ICD-10-CM

## 2021-03-04 DIAGNOSIS — F5101 Primary insomnia: Secondary | ICD-10-CM

## 2021-03-04 DIAGNOSIS — I700 Atherosclerosis of aorta: Secondary | ICD-10-CM | POA: Diagnosis not present

## 2021-03-04 DIAGNOSIS — Z23 Encounter for immunization: Secondary | ICD-10-CM | POA: Diagnosis not present

## 2021-03-04 DIAGNOSIS — E785 Hyperlipidemia, unspecified: Secondary | ICD-10-CM

## 2021-03-04 DIAGNOSIS — K635 Polyp of colon: Secondary | ICD-10-CM

## 2021-03-04 MED ORDER — ROSUVASTATIN CALCIUM 20 MG PO TABS: 20.0000 mg | ORAL_TABLET | Freq: Every day | ORAL | 1 refills | Status: DC

## 2021-03-04 MED ORDER — TADALAFIL 20 MG PO TABS: 20.0000 mg | ORAL_TABLET | Freq: Every day | ORAL | 1 refills | Status: DC | PRN

## 2021-03-04 MED ORDER — NEBIVOLOL HCL 20 MG PO TABS
20.0000 mg | ORAL_TABLET | Freq: Every day | ORAL | 1 refills | Status: DC
Start: 2021-03-04 — End: 2021-06-02

## 2021-06-02 ENCOUNTER — Other Ambulatory Visit: Payer: Self-pay

## 2021-06-02 DIAGNOSIS — I10 Essential (primary) hypertension: Secondary | ICD-10-CM

## 2021-06-02 MED ORDER — NEBIVOLOL HCL 20 MG PO TABS: 20.0000 mg | ORAL_TABLET | Freq: Every day | ORAL | 1 refills | Status: DC

## 2021-06-26 NOTE — Progress Notes (Signed)
Name: Charles Valentine   MRN: [id number]    DOB: 04-Apr-1956   Date:06/30/2021       Progress Note  Subjective  Chief Complaint  Annual Exam  HPI  Patient presents for annual CPE .  IPSS Questionnaire (AUA-7): Over the past month   1)  How often have you had a sensation of not emptying your bladder completely after you finish urinating?  0 - Not at all  2)  How often have you had to urinate again less than two hours after you finished urinating? 0 - Not at all  3)  How often have you found you stopped and started again several times when you urinated?  0 - Not at all  4) How difficult have you found it to postpone urination?  0 - Not at all  5) How often have you had a weak urinary stream?  0 - Not at all  6) How often have you had to push or strain to begin urination?  0 - Not at all  7) How many times did you most typically get up to urinate from the time you went to bed until the time you got up in the morning?  3 - 3 times  Total score:  0-7 mildly symptomatic   8-19 moderately symptomatic   20-35 severely symptomatic     Diet:oatmeal with fruit and nuts. Eats fish and chicken on a regular basis. Cutting down on bread Exercise: continue regular physical activity   Depression: phq 9 is negative Depression screen [facility] 2/9 06/30/2021 03/04/2021 08/28/2020 07/25/2020 07/16/2020  Decreased Interest 0 0 0 0 0  Down, Depressed, Hopeless 0 0 0 0 0  PHQ - 2 Score 0 0 0 0 0  Altered sleeping 0 0 - - -  Tired, decreased energy 0 0 - - -  Change in appetite 0 0 - - -  Feeling bad or failure about yourself  0 0 - - -  Trouble concentrating 0 0 - - -  Moving slowly or fidgety/restless 0 0 - - -  Suicidal thoughts 0 0 - - -  PHQ-9 Score 0 0 - - -  Difficult doing work/chores - - - - -  Some recent data might be hidden    Hypertension:  BP Readings from Last 3 Encounters:  06/30/21 134/82  03/04/21 138/86  09/18/20 121/69    Obesity: Wt Readings from Last 3 Encounters:  06/30/21  184 lb (83.5 kg)  03/04/21 181 lb (82.1 kg)  09/18/20 180 lb (81.6 kg)   BMI Readings from Last 3 Encounters:  06/30/21 24.28 kg/m  03/04/21 24.55 kg/m  09/18/20 24.41 kg/m     Lipids:  Lab Results  Component Value Date   CHOL 128 08/28/2020   CHOL 149 07/16/2020   CHOL 183 07/25/2019   Lab Results  Component Value Date   HDL 43 08/28/2020   HDL 30 (L) 07/16/2020   HDL 54 07/25/2019   Lab Results  Component Value Date   LDLCALC 70 08/28/2020   LDLCALC 91 07/16/2020   LDLCALC 106 (H) 07/25/2019   Lab Results  Component Value Date   TRIG 70 08/28/2020   TRIG 188 (H) 07/16/2020   TRIG 133 07/25/2019   Lab Results  Component Value Date   CHOLHDL 3.0 08/28/2020   CHOLHDL 5.0 (H) 07/16/2020   CHOLHDL 3.4 07/25/2019   No results found for: LDLDIRECT Glucose:  Glucose, Bld  Date Value Ref Range Status  08/28/2020 99 65 -  99 mg/dL Final    Comment:    .            Fasting reference interval .   07/25/2020 98 65 - 99 mg/dL Final    Comment:    .            Fasting reference interval .   07/19/2020 90 70 - 99 mg/dL Final    Comment:    Glucose reference range applies only to samples taken after fasting for at least 8 hours.    [place] Office Visit from 12/01/2018 in [facility]  AUDIT-C Score 3       Married STD testing and prevention (HIV/chl/gon/syphilis):  07/17/20 Hep C Screening: 02/28/13 Skin cancer: Discussed monitoring for atypical lesions Colorectal cancer: 09/18/20 Prostate cancer:   Lab Results  Component Value Date   PSA 0.7 06/08/2016     Lung cancer:  Low Dose CT Chest recommended if Age 29-80 years, 30 pack-year currently smoking OR have quit w/in 15years. Patient  no AAA: The USPSTF recommends one-time screening with ultrasonography in men ages 86 to 53 years who have ever smoked. Patient:  no ECG:  03/16/17  Vaccines:   HPV: N/A Tdap: up to date Shingrix: up to date Pneumonia: N/A Flu: up to  date COVID-19: up to date  Advanced Care Planning: A voluntary discussion about advance care planning including the explanation and discussion of advance directives.  Discussed health care proxy and Living will, and the patient was able to identify a health care proxy as wife .  Patient has a living will and power of attorney of health care  Patient Active Problem List   Diagnosis Date Noted   Screening for colon cancer    Atherosclerosis of abdominal aorta ([place]) 07/25/2020   Diverticulosis 07/25/2020   Sinus tachycardia 09/02/2016   Essential hypertension 09/02/2016   Eczema 06/08/2016   Hyperlipidemia LDL goal <130 04/08/2016   ED (erectile dysfunction)     Past Surgical History:  Procedure Laterality Date   COLONOSCOPY WITH PROPOFOL N/A 09/18/2020   Procedure: COLONOSCOPY WITH PROPOFOL;  Surgeon: Lin Landsman, MD;  Location: [place];  Service: Gastroenterology;  Laterality: N/A;    Family History  Problem Relation Age of Onset   Hypertension Mother    Diabetes Mother    Heart disease Mother 35       CABG   Heart attack Mother 59   Obesity Sister    Drug abuse Brother     Social History   Socioeconomic History   Marital status: Married    Spouse name: [name]   Number of children: 2   Years of education: Not on file   Highest education level: 12th grade  Occupational History   Occupation: Glass blower/designer    Comment: [employer]  Tobacco Use   Smoking status: Former    Packs/day: 0.50    Years: 10.00    Pack years: 5.00    Types: Cigarettes    Quit date: 1989    Years since quitting: 34.2   Smokeless tobacco: Never  Vaping Use   Vaping Use: Never used  Substance and Sexual Activity   Alcohol use: Yes    Alcohol/week: 8.0 standard drinks    Types: 6 Cans of beer, 2 Shots of liquor per week   Drug use: No   Sexual activity: Yes    Partners: Female    Birth control/protection: None  Other Topics Concern  Not on file  Social  History Narrative   Married    He has two grown son's and 3 grandchildren   Retired October 2020    Social Determinants of Radio broadcast assistant Strain: Low Risk    Difficulty of Paying Living Expenses: Not hard at all  Food Insecurity: No Food Insecurity   Worried About Charity fundraiser in the Last Year: Never true   Arboriculturist in the Last Year: Never true  Transportation Needs: No Transportation Needs   Lack of Transportation (Medical): No   Lack of Transportation (Non-Medical): No  Physical Activity: Sufficiently Active   Days of Exercise per Week: 6 days   Minutes of Exercise per Session: 40 min  Stress: Stress Concern Present   Feeling of Stress : To some extent  Social Connections: Moderately Integrated   Frequency of Communication with Friends and Family: More than three times a week   Frequency of Social Gatherings with Friends and Family: Twice a week   Attends Religious Services: 1 to 4 times per year   Active Member of [employer] or Organizations: No   Attends Music therapist: Never   Marital Status: Married  Human resources officer Violence: Not At Risk   Fear of Current or Ex-Partner: No   Emotionally Abused: No   Physically Abused: No   Sexually Abused: No     Current Outpatient Medications:    Nebivolol HCl 20 MG TABS, Take 1 tablet (20 mg total) by mouth daily., Disp: 90 tablet, Rfl: 1   rosuvastatin (CRESTOR) 20 MG tablet, Take 1 tablet (20 mg total) by mouth daily., Disp: 90 tablet, Rfl: 1   tadalafil (CIALIS) 20 MG tablet, Take 1 tablet (20 mg total) by mouth daily as needed for erectile dysfunction., Disp: 30 tablet, Rfl: 1   pantoprazole (PROTONIX) 40 MG tablet, Take 1 tablet (40 mg total) by mouth daily. (Patient not taking: Reported on 06/30/2021), Disp: 30 tablet, Rfl: 11  No Known Allergies   ROS  Constitutional: Negative for fever or weight change.  Respiratory: Negative for cough and shortness of breath.   Cardiovascular:  Negative for chest pain or palpitations.  Gastrointestinal: Negative for abdominal pain, no bowel changes.  Musculoskeletal: Negative for gait problem or joint swelling.  Skin: Negative for rash.  Neurological: Negative for dizziness or headache.  No other specific complaints in a complete review of systems (except as listed in HPI above).    Objective  Vitals:   06/30/21 0817  BP: 134/82  Pulse: 76  Resp: 16  SpO2: 98%  Weight: 184 lb (83.5 kg)  Height: '6\' 1"'$  (1.854 m)    Body mass index is 24.28 kg/m.  Physical Exam  Constitutional: Patient appears well-developed and well-nourished. No distress.  HENT: Head: Normocephalic and atraumatic. Ears: B TMs ok, no erythema or effusion; Nose: Nose normal. Mouth/Throat: Oropharynx is clear and moist. No oropharyngeal exudate.  Eyes: Conjunctivae and EOM are normal. Pupils are equal, round, and reactive to light. No scleral icterus.  Neck: Normal range of motion. Neck supple. No JVD present. No thyromegaly present.  Cardiovascular: Normal rate, regular rhythm and normal heart sounds.  No murmur heard. No BLE edema. Pulmonary/Chest: Effort normal and breath sounds normal. No respiratory distress. Abdominal: Soft. Bowel sounds are normal, no distension. There is no tenderness. no masses MALE GENITALIA: Normal descended testes bilaterally, no masses palpated, no hernias, no lesions, no discharge RECTAL: Prostate normal size and consistency, no rectal masses  or hemorrhoids Musculoskeletal: Normal range of motion, no joint effusions. No gross deformities Neurological: he is alert and oriented to person, place, and time. No cranial nerve deficit. Coordination, balance, strength, speech and gait are normal.  Skin: Skin is warm and dry. No rash noted. No erythema.  Psychiatric: Patient has a normal mood and affect. behavior is normal. Judgment and thought content normal.    Fall Risk: Fall Risk  06/30/2021 03/04/2021 08/28/2020 07/25/2020  07/16/2020  Falls in the past year? 0 0 0 0 0  Number falls in past yr: 0 0 0 0 0  Injury with Fall? 0 0 0 0 0  Risk for fall due to : No Fall Risks No Fall Risks - - -  Follow up Falls prevention discussed Falls prevention discussed - - -     Functional Status Survey: Is the patient deaf or have difficulty hearing?: No Does the patient have difficulty seeing, even when wearing glasses/contacts?: No Does the patient have difficulty concentrating, remembering, or making decisions?: No Does the patient have difficulty walking or climbing stairs?: No Does the patient have difficulty dressing or bathing?: No Does the patient have difficulty doing errands alone such as visiting a doctor's office or shopping?: No    Assessment & Plan  1. Well adult exam  - Lipid panel - CBC with Differential/Platelet - COMPLETE METABOLIC PANEL WITH GFR - PSA - Iron, TIBC and Ferritin Panel - Hemoglobin A1c - CULTURE, URINE COMPREHENSIVE - Ambulatory referral to Gastroenterology  2. Anemia, unspecified type  - CBC with Differential/Platelet - Iron, TIBC and Ferritin Panel  3. Hyperlipidemia LDL goal <130  - Lipid panel  4. Diabetes mellitus screening  - Hemoglobin A1c  5. Prostate cancer screening  - PSA  6. Long-term use of high-risk medication  - COMPLETE METABOLIC PANEL WITH GFR  7. Nocturia  - PSA - CULTURE, URINE COMPREHENSIVE  8. History of colon polyps  - Ambulatory referral to Gastroenterology     -Prostate cancer screening and PSA options (with potential risks and benefits of testing vs not testing) were discussed along with recent recs/guidelines. -USPSTF grade A and B recommendations reviewed with patient; age-appropriate recommendations, preventive care, screening tests, etc discussed and encouraged; healthy living encouraged; see AVS for patient education given to patient -Discussed importance of 150 minutes of physical activity weekly, eat two servings of fish  weekly, eat one serving of tree nuts ( cashews, pistachios, pecans, almonds.Marland Kitchen) every other day, eat 6 servings of fruit/vegetables daily and drink plenty of water and avoid sweet beverages.  -Reviewed Health Maintenance: yes

## 2021-06-26 NOTE — Patient Instructions (Signed)

## 2021-06-30 ENCOUNTER — Encounter: Payer: Self-pay | Admitting: Family Medicine

## 2021-06-30 ENCOUNTER — Other Ambulatory Visit: Payer: Self-pay

## 2021-06-30 ENCOUNTER — Ambulatory Visit (INDEPENDENT_AMBULATORY_CARE_PROVIDER_SITE_OTHER): Payer: 59 | Admitting: Family Medicine

## 2021-06-30 VITALS — BP 134/82 | HR 76 | Resp 16 | Ht 73.0 in | Wt 184.0 lb

## 2021-06-30 DIAGNOSIS — Z86010 Personal history of colonic polyps: Secondary | ICD-10-CM

## 2021-06-30 DIAGNOSIS — E785 Hyperlipidemia, unspecified: Secondary | ICD-10-CM

## 2021-06-30 DIAGNOSIS — Z125 Encounter for screening for malignant neoplasm of prostate: Secondary | ICD-10-CM

## 2021-06-30 DIAGNOSIS — Z131 Encounter for screening for diabetes mellitus: Secondary | ICD-10-CM

## 2021-06-30 DIAGNOSIS — R351 Nocturia: Secondary | ICD-10-CM

## 2021-06-30 DIAGNOSIS — Z0000 Encounter for general adult medical examination without abnormal findings: Secondary | ICD-10-CM

## 2021-06-30 DIAGNOSIS — D649 Anemia, unspecified: Secondary | ICD-10-CM

## 2021-06-30 DIAGNOSIS — Z79899 Other long term (current) drug therapy: Secondary | ICD-10-CM

## 2021-06-30 MED ORDER — NA SULFATE-K SULFATE-MG SULF 17.5-3.13-1.6 GM/177ML PO SOLN: 1.0000 | Freq: Once | ORAL | 0 refills | Status: AC

## 2021-06-30 NOTE — Progress Notes (Signed)
Gastroenterology Pre-Procedure Review ? ?Request Date: 10/01/2021 ?Requesting Physician: Dr. Marius Ditch ? ?PATIENT REVIEW QUESTIONS: The patient responded to the following health history questions as indicated:   ? ?1. Are you having any GI issues? no ?2. Do you have a personal history of Polyps? yes (last colonoscopy) ?3. Do you have a family history of Colon Cancer or Polyps? no ?4. Diabetes Mellitus? no ?5. Joint replacements in the past 12 months?no ?6. Major health problems in the past 3 months?no ?7. Any artificial heart valves, MVP, or defibrillator?no ?   ?MEDICATIONS & ALLERGIES:    ?Patient reports the following regarding taking any anticoagulation/antiplatelet therapy:   ?Plavix, Coumadin, Eliquis, Xarelto, Lovenox, Pradaxa, Brilinta, or Effient? no ?Aspirin? no ? ?Patient confirms/reports the following medications:  ?Current Outpatient Medications  ?Medication Sig Dispense Refill  ? Nebivolol HCl 20 MG TABS Take 1 tablet (20 mg total) by mouth daily. 90 tablet 1  ? rosuvastatin (CRESTOR) 20 MG tablet Take 1 tablet (20 mg total) by mouth daily. 90 tablet 1  ? tadalafil (CIALIS) 20 MG tablet Take 1 tablet (20 mg total) by mouth daily as needed for erectile dysfunction. 30 tablet 1  ? ?No current facility-administered medications for this visit.  ? ? ?Patient confirms/reports the following allergies:  ?No Known Allergies ? ?No orders of the defined types were placed in this encounter. ? ? ?AUTHORIZATION INFORMATION ?Primary Insurance: ?1D#: ?Group #: ? ?Secondary Insurance: ?1D#: ?Group #: ? ?SCHEDULE INFORMATION: ?Date: 10/01/2021 ?Time: ?Location:[facility] ? ?

## 2021-07-02 LAB — IRON,TIBC AND FERRITIN PANEL
%SAT: 24 % (calc) (ref 20–48)
Ferritin: 100 ng/mL (ref 24–380)
Iron: 84 ug/dL (ref 50–180)
TIBC: 357 mcg/dL (calc) (ref 250–425)

## 2021-07-02 LAB — HEMOGLOBIN A1C
Hgb A1c MFr Bld: 5.7 % of total Hgb — ABNORMAL HIGH (ref <5.7)
Mean Plasma Glucose: 117 mg/dL
eAG (mmol/L): 6.5 mmol/L

## 2021-07-02 LAB — COMPLETE METABOLIC PANEL WITH GFR
AG Ratio: 1.3 (calc) (ref 1.0–2.5)
ALT: 35 U/L (ref 9–46)
AST: 21 U/L (ref 10–35)
Albumin: 4.5 g/dL (ref 3.6–5.1)
Alkaline phosphatase (APISO): 62 U/L (ref 35–144)
BUN: 12 mg/dL (ref 7–25)
CO2: 27 mmol/L (ref 20–32)
Calcium: 9.8 mg/dL (ref 8.6–10.3)
Chloride: 104 mmol/L (ref 98–110)
Creat: 1.08 mg/dL (ref 0.70–1.35)
Globulin: 3.5 g/dL (calc) (ref 1.9–3.7)
Glucose, Bld: 101 mg/dL — ABNORMAL HIGH (ref 65–99)
Potassium: 4.5 mmol/L (ref 3.5–5.3)
Sodium: 139 mmol/L (ref 135–146)
Total Bilirubin: 0.5 mg/dL (ref 0.2–1.2)
Total Protein: 8 g/dL (ref 6.1–8.1)
eGFR: 77 mL/min/{1.73_m2} (ref >=60)

## 2021-07-02 LAB — CBC WITH DIFFERENTIAL/PLATELET
Absolute Monocytes: 503 cells/uL (ref 200–950)
Basophils Absolute: 30 cells/uL (ref 0–200)
Basophils Relative: 0.4 %
Eosinophils Absolute: 68 cells/uL (ref 15–500)
Eosinophils Relative: 0.9 %
HCT: 42.8 % (ref 38.5–50.0)
Hemoglobin: 14.5 g/dL (ref 13.2–17.1)
Lymphs Abs: 1313 cells/uL (ref 850–3900)
MCH: 30.1 pg (ref 27.0–33.0)
MCHC: 33.9 g/dL (ref 32.0–36.0)
MCV: 89 fL (ref 80.0–100.0)
MPV: 10.4 fL (ref 7.5–12.5)
Monocytes Relative: 6.7 %
Neutro Abs: 5588 cells/uL (ref 1500–7800)
Neutrophils Relative %: 74.5 %
Platelets: 239 10*3/uL (ref 140–400)
RBC: 4.81 10*6/uL (ref 4.20–5.80)
RDW: 12.7 % (ref 11.0–15.0)
Total Lymphocyte: 17.5 %
WBC: 7.5 10*3/uL (ref 3.8–10.8)

## 2021-07-02 LAB — CULTURE, URINE COMPREHENSIVE
MICRO NUMBER:: 13127225
RESULT:: NO GROWTH
SPECIMEN QUALITY:: ADEQUATE

## 2021-07-02 LAB — LIPID PANEL
Cholesterol: 161 mg/dL (ref <200)
HDL: 50 mg/dL (ref >=40)
LDL Cholesterol (Calc): 92 mg/dL (calc)
Non-HDL Cholesterol (Calc): 111 mg/dL (calc) (ref <130)
Total CHOL/HDL Ratio: 3.2 (calc) (ref <5.0)
Triglycerides: 92 mg/dL (ref <150)

## 2021-07-02 LAB — PSA: PSA: 0.9 ng/mL (ref <=4.00)

## 2021-07-24 ENCOUNTER — Ambulatory Visit: Payer: Self-pay

## 2021-07-24 NOTE — Telephone Encounter (Signed)
?  Chief Complaint: COVID positive ?Symptoms: runny nose, cough, had body aches but that's improved ?Frequency: 2 days ?Pertinent Negatives: Patient denies fever, SOB ?Disposition: []ED /[]Urgent Care (no appt availability in office) / []Appointment(In office/virtual)/ [] [place] [facility]/ [x]Home Care/ []Refused Recommended Disposition /[][place] [facility]/ [] Follow-up with PCP ?Additional Notes: pt tested positive for COVID today using home test. He states his symptoms he feels have improved. I advised since pt isn't high risk and just has mild symptoms ok to tx at home using OTC medications. Advised if symptoms get worse after trying OTC meds to call back and discuss with PCP. Pt's wife verbalized understanding.  ? ?Summary: positive for COVID today//cough, body aches, , runny nose  ?  Pt wife stated pt tested positive for COVID today. Pt experiencing a cough, body aches, , runny nose , no sob, no fever at this time.  ? ?No appointments.  ? ?Pt seeking clinical advice.  ?  ? ?Reason for Disposition ? [1] COVID-19 diagnosed by positive lab test (e.g., PCR, rapid self-test kit) AND [2] mild symptoms (e.g., cough, fever, others) AND [3] no complications or SOB ? ?Answer Assessment - Initial Assessment Questions ?1. COVID-19 DIAGNOSIS: "Who made your COVID-19 diagnosis?" "Was it confirmed by a positive lab test or self-test?" If not diagnosed by a doctor (or NP/PA), ask "Are there lots of cases (community spread) where you live?" Note: See public health department website, if unsure. ?    Home test  ?3. ONSET: "When did the COVID-19 symptoms start?"  ?    Tuesday ?5. COUGH: "Do you have a cough?" If Yes, ask: "How bad is the cough?"   ?    yes ?6. FEVER: "Do you have a fever?" If Yes, ask: "What is your temperature, how was it measured, and when did it start?" ?    no ?7. RESPIRATORY STATUS: "Describe your breathing?" (e.g., shortness of breath, wheezing, unable to speak)  ?    no ?8. BETTER-SAME-WORSE:  "Are you getting better, staying the same or getting worse compared to yesterday?"  If getting worse, ask, "In what way?" ?    Getting better  ?9. HIGH RISK DISEASE: "Do you have any chronic medical problems?" (e.g., asthma, heart or lung disease, weak immune system, obesity, etc.) ?    no ?10. VACCINE: "Have you had the COVID-19 vaccine?" If Yes, ask: "Which one, how many shots, when did you get it?" ?      yes ?11. BOOSTER: "Have you received your COVID-19 booster?" If Yes, ask: "Which one and when did you get it?" ?      yes ?13. OTHER SYMPTOMS: "Do you have any other symptoms?"  (e.g., chills, fatigue, headache, loss of smell or taste, muscle pain, sore throat) ?      Body aches, cough, runny nose ? ?Protocols used: Coronavirus (MOQHU-76) Diagnosed or Suspected-A-AH ? ?

## 2021-09-01 NOTE — Progress Notes (Addendum)
Name: Charles Valentine   MRN: [id number]    DOB: [birth year]   Date:09/02/2021 ? ?     Progress Note ? ?Subjective ? ?Chief Complaint ? ?Follow up  ? ?HPI ? ?HTN: diagnosed in 2018, he was  Norvasc 10 mg also tried Exforge but switched  to Bystolic because of tachycardia, tolerating 20 mg daily. He denies chest pain or SOB ? ?Echo 08/2016 :  ?- Left ventricle: The cavity size was normal. Wall thickness was ?  normal. Systolic function was normal. The estimated ejection ?  fraction was in the range of 60% to 65%. Wall motion was normal; ?  there were no regional wall motion abnormalities. Left ?  ventricular diastolic function parameters were normal. ?  ?Hyperlipidemia/Atherosclerosis aorta : plaques found on CT done 06/2020  he states he states he skips doses sometimes, advised to take it in am's with Bystolic to increase compliance.  ?  ?ED: he is taking Cialis occasionally,doing well on medication, no side effects . I am sending refill to [facility]  ? ?Colon polyps: recent colonoscopy showed atypia , he will have colonoscopy in June  ?   ?Insomnia:he states retired last year, and is sleeping well, takes medication very seldom. ? ?Renal cyst: during imaging for diarrhea he was found to have a renal cyst, US showed possible complex cyst, but MRI was benign - unchanged  ? ?MRI abdomen done 08/09/2020:  ? ?2.3 x 1.6 cm cyst with a single thin septation in the posterior ?interpolar right kidney (series 19/image 22). No solid component or ?enhancement following contrast administration. 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 ? ? ?Based on UptoDate, considered benign and no further work up required ? ? ?Patient Active Problem List  ? Diagnosis Date Noted  ?? Cyst of right kidney 09/02/2021  ?? Screening for colon cancer   ?? Atherosclerosis of abdominal aorta ([place]) 07/25/2020  ?? Diverticulosis 07/25/2020  ?? Sinus tachycardia 09/02/2016  ?? Essential hypertension 09/02/2016  ?? Eczema 06/08/2016  ??  Hypercholesterolemia 04/08/2016  ?? ED (erectile dysfunction)   ? ? ?Past Surgical History:  ?Procedure Laterality Date  ?? COLONOSCOPY WITH PROPOFOL N/A 09/18/2020  ? Procedure: COLONOSCOPY WITH PROPOFOL;  Surgeon: Lin Landsman, MD;  Location: [facility] ENDOSCOPY;  Service: Gastroenterology;  Laterality: N/A;  ? ? ?Family History  ?Problem Relation Age of Onset  ?? Hypertension Mother   ?? Diabetes Mother   ?? Heart disease Mother 79  ?     CABG  ?? Heart attack Mother 72  ?? Obesity Sister   ?? Drug abuse Brother   ? ? ?Social History  ? ?Tobacco Use  ?? Smoking status: Former  ?  Packs/day: 0.50  ?  Years: 10.00  ?  Pack years: 5.00  ?  Types: Cigarettes  ?  Quit date: 1989  ?  Years since quitting: 34.3  ?? Smokeless tobacco: Never  ?Substance Use Topics  ?? Alcohol use: Yes  ?  Alcohol/week: 8.0 standard drinks  ?  Types: 6 Cans of beer, 2 Shots of liquor per week  ? ? ? ?Current Outpatient Medications:  ??  Nebivolol HCl 20 MG TABS, Take 1 tablet (20 mg total) by mouth daily., Disp: 90 tablet, Rfl: 0 ??  rosuvastatin (CRESTOR) 20 MG tablet, Take 1 tablet (20 mg total) by mouth daily., Disp: 90 tablet, Rfl: 1 ??  tadalafil (CIALIS) 20 MG tablet, Take 1 tablet (20 mg total) by mouth daily as needed for erectile  dysfunction., Disp: 90 tablet, Rfl: 0 ? ?No Known Allergies ? ?I personally reviewed active problem list, medication list, allergies, family history, social history, health maintenance with the patient/caregiver today. ? ? ?ROS ? ?Constitutional: Negative for fever or weight change.  ?Respiratory: Negative for cough and shortness of breath.   ?Cardiovascular: Negative for chest pain or palpitations.  ?Gastrointestinal: Negative for abdominal pain, no bowel changes.  ?Musculoskeletal: Negative for gait problem or joint swelling.  ?Skin: Negative for rash.  ?Neurological: Negative for dizziness or headache.  ?No other specific complaints in a complete review of systems (except as listed in HPI above).   ? ?Objective ? ?Vitals:  ? 09/02/21 0739  ?BP: 128/76  ?Pulse: 60  ?Resp: 16  ?Temp: 98 ?F (36.7 ?C)  ?TempSrc: Oral  ?SpO2: 99%  ?Weight: 184 lb 8 oz (83.7 kg)  ?Height: 6' 0.05" (1.83 m)  ? ? ?Body mass index is 24.99 kg/m?. ? ?Physical Exam ? ?Constitutional: Patient appears well-developed and well-nourished. No distress.  ?HEENT: head atraumatic, normocephalic, pupils equal and reactive to light, neck supple ?Cardiovascular: Normal rate, regular rhythm and normal heart sounds.  No murmur heard. No BLE edema. ?Pulmonary/Chest: Effort normal and breath sounds normal. No respiratory distress. ?Abdominal: Soft.  There is no tenderness. ?Psychiatric: Patient has a normal mood and affect. behavior is normal. Judgment and thought content normal.  ? ?Recent Results (from the past 2160 hour(s))  ?Lipid panel     Status: None  ? Collection Time: 06/30/21  9:11 AM  ?Result Value Ref Range  ? Cholesterol 161 <200 mg/dL  ? HDL 50 > OR = 40 mg/dL  ? Triglycerides 92 <150 mg/dL  ? LDL Cholesterol (Calc) 92 mg/dL (calc)  ?  Comment: Reference range: <100 ?Marland Kitchen ?Desirable range <100 mg/dL for primary prevention;   ?<70 mg/dL for patients with CHD or diabetic patients  ?with > or = 2 CHD risk factors. ?. ?LDL-C is now calculated using the Martin-Hopkins  ?calculation, which is a validated novel method providing  ?better accuracy than the Friedewald equation in the  ?estimation of LDL-C.  ?Cresenciano Genre et al. Annamaria Helling. 3267;124(58): 2061-2068  ?(http://education.[employer].com/faq/FAQ164) ?  ? Total CHOL/HDL Ratio 3.2 <5.0 (calc)  ? Non-HDL Cholesterol (Calc) 111 <130 mg/dL (calc)  ?  Comment: For patients with diabetes plus 1 major ASCVD risk  ?factor, treating to a non-HDL-C goal of <100 mg/dL  ?(LDL-C of <70 mg/dL) is considered a therapeutic  ?option. ?  ?CBC with Differential/Platelet     Status: None  ? Collection Time: 06/30/21  9:11 AM  ?Result Value Ref Range  ? WBC 7.5 3.8 - 10.8 Thousand/uL  ? RBC 4.81 4.20 - 5.80  Million/uL  ? Hemoglobin 14.5 13.2 - 17.1 g/dL  ? HCT 42.8 38.5 - 50.0 %  ? MCV 89.0 80.0 - 100.0 fL  ? MCH 30.1 27.0 - 33.0 pg  ? MCHC 33.9 32.0 - 36.0 g/dL  ? RDW 12.7 11.0 - 15.0 %  ? Platelets 239 140 - 400 Thousand/uL  ? MPV 10.4 7.5 - 12.5 fL  ? Neutro Abs 5,588 1,500 - 7,800 cells/uL  ? Lymphs Abs 1,313 850 - 3,900 cells/uL  ? Absolute Monocytes 503 200 - 950 cells/uL  ? Eosinophils Absolute 68 15 - 500 cells/uL  ? Basophils Absolute 30 0 - 200 cells/uL  ? Neutrophils Relative % 74.5 %  ? Total Lymphocyte 17.5 %  ? Monocytes Relative 6.7 %  ? Eosinophils Relative 0.9 %  ? Basophils Relative  0.4 %  ?COMPLETE METABOLIC PANEL WITH GFR     Status: Abnormal  ? Collection Time: 06/30/21  9:11 AM  ?Result Value Ref Range  ? Glucose, Bld 101 (H) 65 - 99 mg/dL  ?  Comment: . ?           Fasting reference interval ?. ?For someone without known diabetes, a glucose value ?between 100 and 125 mg/dL is consistent with ?prediabetes and should be confirmed with a ?follow-up test. ?. ?  ? BUN 12 7 - 25 mg/dL  ? Creat 1.08 0.70 - 1.35 mg/dL  ? eGFR 77 > OR = 60 mL/min/1.47m  ?  Comment: The eGFR is based on the CKD-EPI 2021 equation. To calculate  ?the new eGFR from a previous Creatinine or Cystatin C ?result, go to https://www.kidney.org/professionals/ ?kdoqi/gfr%5Fcalculator ?  ? BUN/Creatinine Ratio NOT APPLICABLE 6 - 22 (calc)  ? Sodium 139 135 - 146 mmol/L  ? Potassium 4.5 3.5 - 5.3 mmol/L  ? Chloride 104 98 - 110 mmol/L  ? CO2 27 20 - 32 mmol/L  ? Calcium 9.8 8.6 - 10.3 mg/dL  ? Total Protein 8.0 6.1 - 8.1 g/dL  ? Albumin 4.5 3.6 - 5.1 g/dL  ? Globulin 3.5 1.9 - 3.7 g/dL (calc)  ? AG Ratio 1.3 1.0 - 2.5 (calc)  ? Total Bilirubin 0.5 0.2 - 1.2 mg/dL  ? Alkaline phosphatase (APISO) 62 35 - 144 U/L  ? AST 21 10 - 35 U/L  ? ALT 35 9 - 46 U/L  ?PSA     Status: None  ? Collection Time: 06/30/21  9:11 AM  ?Result Value Ref Range  ? PSA 0.90 < OR = 4.00 ng/mL  ?  Comment: The total PSA value from this assay system is   ?standardized against the WHO standard. The test  ?result will be approximately 20% lower when compared  ?to the equimolar-standardized total PSA (Beckman  ?Coulter). Comparison of serial PSA results should be  ?interprete

## 2021-09-02 ENCOUNTER — Encounter: Payer: Self-pay | Admitting: Family Medicine

## 2021-09-02 ENCOUNTER — Ambulatory Visit (INDEPENDENT_AMBULATORY_CARE_PROVIDER_SITE_OTHER): Payer: 59 | Admitting: Family Medicine

## 2021-09-02 VITALS — BP 128/76 | HR 60 | Temp 98.0°F | Resp 16 | Ht 72.05 in | Wt 184.5 lb

## 2021-09-02 DIAGNOSIS — E7800 Pure hypercholesterolemia, unspecified: Secondary | ICD-10-CM

## 2021-09-02 DIAGNOSIS — N281 Cyst of kidney, acquired: Secondary | ICD-10-CM

## 2021-09-02 DIAGNOSIS — N528 Other male erectile dysfunction: Secondary | ICD-10-CM | POA: Diagnosis not present

## 2021-09-02 DIAGNOSIS — I700 Atherosclerosis of aorta: Secondary | ICD-10-CM

## 2021-09-02 DIAGNOSIS — I10 Essential (primary) hypertension: Secondary | ICD-10-CM | POA: Diagnosis not present

## 2021-09-02 MED ORDER — TADALAFIL 20 MG PO TABS: 20.0000 mg | ORAL_TABLET | Freq: Every day | ORAL | 0 refills | Status: DC | PRN

## 2021-09-02 MED ORDER — ROSUVASTATIN CALCIUM 20 MG PO TABS: 20.0000 mg | ORAL_TABLET | Freq: Every day | ORAL | 1 refills | Status: DC

## 2021-09-02 MED ORDER — NEBIVOLOL HCL 20 MG PO TABS: 20.0000 mg | ORAL_TABLET | Freq: Every day | ORAL | 0 refills | Status: DC

## 2021-09-02 NOTE — Assessment & Plan Note (Signed)
On statin therapy 

## 2021-09-02 NOTE — Assessment & Plan Note (Signed)
Doing well on Cialis  ?

## 2021-09-02 NOTE — Assessment & Plan Note (Signed)
bosniak II ?

## 2021-09-02 NOTE — Assessment & Plan Note (Signed)
Controlled with Bystolic ?

## 2021-10-01 ENCOUNTER — Encounter
Admission: RE | Disposition: A | Discharge status: Home / Self Care | Payer: Self-pay | Source: Home / Self Care | Attending: Gastroenterology

## 2021-10-01 ENCOUNTER — Encounter: Payer: Self-pay | Admitting: Gastroenterology

## 2021-10-01 ENCOUNTER — Ambulatory Visit
Admission: RE | Admit: 2021-10-01 | Discharge: 2021-10-01 | Disposition: A | Discharge status: Home / Self Care | Payer: 59 | Attending: Gastroenterology | Admitting: Gastroenterology

## 2021-10-01 ENCOUNTER — Ambulatory Visit: Payer: 59 | Admitting: Certified Registered"

## 2021-10-01 ENCOUNTER — Other Ambulatory Visit: Payer: Self-pay

## 2021-10-01 DIAGNOSIS — Z860100 Personal history of colon polyps, unspecified: Secondary | ICD-10-CM

## 2021-10-01 DIAGNOSIS — Z87891 Personal history of nicotine dependence: Secondary | ICD-10-CM | POA: Diagnosis not present

## 2021-10-01 DIAGNOSIS — I10 Essential (primary) hypertension: Secondary | ICD-10-CM | POA: Diagnosis not present

## 2021-10-01 DIAGNOSIS — Z79899 Other long term (current) drug therapy: Secondary | ICD-10-CM | POA: Diagnosis not present

## 2021-10-01 DIAGNOSIS — K644 Residual hemorrhoidal skin tags: Secondary | ICD-10-CM | POA: Insufficient documentation

## 2021-10-01 DIAGNOSIS — K648 Other hemorrhoids: Secondary | ICD-10-CM | POA: Diagnosis not present

## 2021-10-01 DIAGNOSIS — Z9889 Other specified postprocedural states: Secondary | ICD-10-CM | POA: Diagnosis not present

## 2021-10-01 DIAGNOSIS — Z1211 Encounter for screening for malignant neoplasm of colon: Secondary | ICD-10-CM | POA: Insufficient documentation

## 2021-10-01 HISTORY — PX: COLONOSCOPY WITH PROPOFOL: SHX5780

## 2021-10-01 SURGERY — COLONOSCOPY WITH PROPOFOL
Anesthesia: General

## 2021-10-01 MED ORDER — PROPOFOL 10 MG/ML IV BOLUS
INTRAVENOUS | Status: DC | PRN
  Administered 2021-10-01: 70 mg via INTRAVENOUS

## 2021-10-01 MED ORDER — PROPOFOL 500 MG/50ML IV EMUL
INTRAVENOUS | Status: DC | PRN
  Administered 2021-10-01: 150 ug/kg/min via INTRAVENOUS

## 2021-10-01 MED ORDER — SODIUM CHLORIDE 0.9 % IV SOLN: INTRAVENOUS | Status: DC

## 2021-10-01 MED ORDER — LIDOCAINE HCL (CARDIAC) PF 100 MG/5ML IV SOSY
PREFILLED_SYRINGE | INTRAVENOUS | Status: DC | PRN
  Administered 2021-10-01: 50 mg via INTRAVENOUS

## 2021-10-01 NOTE — Transfer of Care (Signed)
Immediate Anesthesia Transfer of Care Note  Patient: Charles Valentine  Procedure(s) Performed: COLONOSCOPY WITH PROPOFOL  Patient Location: PACU and Endoscopy Unit  Anesthesia Type:general  Level of Consciousness: drowsy  Airway & Oxygen Therapy: Patient Spontanous Breathing  Post-op Assessment: Report given to RN and Post -op Vital signs reviewed and stable  Post vital signs: Reviewed and stable  Last Vitals:  Vitals Value Taken Time  BP 113/78 10/01/21 0857  Temp 35.8 C 10/01/21 0857  Pulse 50 10/01/21 0857  Resp 16 10/01/21 0857  SpO2 97 % 10/01/21 0857  Vitals shown include unvalidated device data.  Last Pain:  Vitals:   10/01/21 0857  TempSrc: Tympanic  PainSc: Asleep         Complications: No notable events documented.

## 2021-10-01 NOTE — Anesthesia Procedure Notes (Signed)
Procedure Name: MAC Date/Time: 10/01/2021 8:45 AM  Performed by: Biagio Borg, CRNAPre-anesthesia Checklist: Patient identified, Emergency Drugs available, Suction available, Patient being monitored and Timeout performed Patient Re-evaluated:Patient Re-evaluated prior to induction Oxygen Delivery Method: Nasal cannula Induction Type: IV induction Placement Confirmation: positive ETCO2 and CO2 detector

## 2021-10-01 NOTE — Anesthesia Preprocedure Evaluation (Addendum)
Anesthesia Evaluation  Patient identified by MRN, date of birth, ID band Patient awake    Reviewed: Allergy & Precautions, NPO status , Patient's Chart, lab work & pertinent test results, reviewed documented beta blocker date and time   History of Anesthesia Complications Negative for: history of anesthetic complications  Airway Mallampati: III       Dental no notable dental hx.    Pulmonary neg sleep apnea, neg COPD, Not current smoker, former smoker,    Pulmonary exam normal        Cardiovascular hypertension, Pt. on medications and Pt. on home beta blockers (-) Past MI and (-) CHF Normal cardiovascular exam(-) dysrhythmias (-) Valvular Problems/Murmurs     Neuro/Psych neg Seizures    GI/Hepatic Neg liver ROS, neg GERD  ,  Endo/Other  neg diabetes  Renal/GU negative Renal ROS     Musculoskeletal   Abdominal   Peds  Hematology   Anesthesia Other Findings   Reproductive/Obstetrics                            Anesthesia Physical  Anesthesia Plan  ASA: II  Anesthesia Plan: General   Post-op Pain Management:    Induction: Intravenous  PONV Risk Score and Plan: 2 and Propofol infusion, TIVA and Treatment may vary due to age or medical condition  Airway Management Planned: Nasal Cannula and Natural Airway  Additional Equipment:   Intra-op Plan:   Post-operative Plan:   Informed Consent: I have reviewed the patients History and Physical, chart, labs and discussed the procedure including the risks, benefits and alternatives for the proposed anesthesia with the patient or authorized representative who has indicated his/her understanding and acceptance.     Dental advisory given  Plan Discussed with: CRNA and Anesthesiologist  Anesthesia Plan Comments:        Anesthesia Quick Evaluation

## 2021-10-01 NOTE — H&P (Signed)
Charles Darby, MD [address]  [place]  [place], [place] [address]  Main: [phone]  Fax: [phone] Pager: [phone]  Primary Care Physician:  Steele Sizer, MD Primary Gastroenterologist:  Dr. Cephas Valentine  Pre-Procedure History & Physical: HPI:  Charles Valentine is a 66 y.o. male is here for an colonoscopy.   Past Medical History:  Diagnosis Date   AKI (acute kidney injury) ([place]) 07/17/2020   ED (erectile dysfunction)    History of colon polyps    Hyperlipidemia    Hypertension    Insomnia     Past Surgical History:  Procedure Laterality Date   COLONOSCOPY WITH PROPOFOL N/A 09/18/2020   Procedure: COLONOSCOPY WITH PROPOFOL;  Surgeon: Lin Landsman, MD;  Location: [facility] ENDOSCOPY;  Service: Gastroenterology;  Laterality: N/A;    Prior to Admission medications   Medication Sig Start Date End Date Taking? Authorizing Provider  Nebivolol HCl 20 MG TABS Take 1 tablet (20 mg total) by mouth daily. 09/02/21  Yes Sowles, Drue Stager, MD  rosuvastatin (CRESTOR) 20 MG tablet Take 1 tablet (20 mg total) by mouth daily. 09/02/21  Yes Sowles, Drue Stager, MD  tadalafil (CIALIS) 20 MG tablet Take 1 tablet (20 mg total) by mouth daily as needed for erectile dysfunction. 09/02/21   Steele Sizer, MD    Allergies as of 06/30/2021   (No Known Allergies)    Family History  Problem Relation Age of Onset   Hypertension Mother    Diabetes Mother    Heart disease Mother 43       CABG   Heart attack Mother 39   Obesity Sister    Drug abuse Brother     Social History   Socioeconomic History   Marital status: Married    Spouse name: [name]   Number of children: 2   Years of education: Not on file   Highest education level: 12th grade  Occupational History   Occupation: Glass blower/designer    Comment: [employer]  Tobacco Use   Smoking status: Former    Packs/day: 0.50    Years: 10.00    Total pack years: 5.00    Types: Cigarettes    Quit date:  1989    Years since quitting: 34.4   Smokeless tobacco: Never  Vaping Use   Vaping Use: Never used  Substance and Sexual Activity   Alcohol use: Yes    Alcohol/week: 8.0 standard drinks of alcohol    Types: 6 Cans of beer, 2 Shots of liquor per week   Drug use: No   Sexual activity: Yes    Partners: Female    Birth control/protection: None  Other Topics Concern   Not on file  Social History Narrative   Married    He has two grown son's and 3 grandchildren   Retired October 2020    Social Determinants of Health   Financial Resource Strain: [place]  (06/30/2021)   Overall Financial Resource Strain (CARDIA)    Difficulty of Paying Living Expenses: Not hard at all  Food Insecurity: No Food Insecurity (06/30/2021)   Hunger Vital Sign    Worried About Running Out of Food in the Last Year: Never true    [place] in the Last Year: Never true  Transportation Needs: No Transportation Needs (06/30/2021)   PRAPARE - Hydrologist (Medical): No    Lack of Transportation (Non-Medical): No  Physical Activity: Sufficiently Active (06/30/2021)   Exercise  Vital Sign    Days of Exercise per Week: 6 days    Minutes of Exercise per Session: 40 min  Stress: Stress Concern Present (06/30/2021)   [place]    Feeling of Stress : To some extent  Social Connections: Moderately Integrated (06/30/2021)   Social Connection and Isolation Panel [NHANES]    Frequency of Communication with Friends and Family: More than three times a week    Frequency of Social Gatherings with Friends and Family: Twice a week    Attends Religious Services: 1 to 4 times per year    Active Member of [employer] or Organizations: No    Attends Archivist Meetings: Never    Marital Status: Married  Human resources officer Violence: Not At Risk (06/30/2021)   Humiliation, Afraid, Rape, and Kick questionnaire    Fear of Current or  Ex-Partner: No    Emotionally Abused: No    Physically Abused: No    Sexually Abused: No    Review of Systems: See HPI, otherwise negative ROS  Physical Exam: BP (!) 211/95   Pulse (!) 55   Temp (!) 96.7 F (35.9 C) (Temporal)   Resp 18   Ht 6' 0.5" (1.842 m)   Wt 83.5 kg   SpO2 100%   BMI 24.61 kg/m  General:   Alert,  pleasant and cooperative in NAD Head:  Normocephalic and atraumatic. Neck:  Supple; no masses or thyromegaly. Lungs:  Clear throughout to auscultation.    Heart:  Regular rate and rhythm. Abdomen:  Soft, nontender and nondistended. Normal bowel sounds, without guarding, and without rebound.   Neurologic:  Alert and  oriented x4;  grossly normal neurologically.  Impression/Plan: Charles Valentine is here for an colonoscopy to be performed for h/o colon adenomas  Risks, benefits, limitations, and alternatives regarding  colonoscopy have been reviewed with the patient.  Questions have been answered.  All parties agreeable.   Sherri Sear, MD  10/01/2021, 8:26 AM

## 2021-10-01 NOTE — Op Note (Signed)
[facility] Patient Name: Charles Valentine Procedure Date: 10/01/2021 8:28 AM MRN: [id number] Account #: [id number] Date of Birth: January 21, 1956 Admit Type: Outpatient Age: 66 Room: [facility] ENDO ROOM 4 Gender: Male Note Status: Finalized Instrument Name: Jasper Riling [id number] Procedure:             Colonoscopy Indications:           Surveillance: Piecemeal removal of large sessile                         adenoma last colonoscopy (< 3 yrs) Providers:             Lin Landsman MD, MD Referring MD:          Bethena Roys. Sowles, MD (Referring MD) Medicines:             General Anesthesia Complications:         No immediate complications. Estimated blood loss: None. Procedure:             Pre-Anesthesia Assessment:                        - Prior to the procedure, a History and Physical was                         performed, and patient medications and allergies were                         reviewed. The patient is competent. The risks and                         benefits of the procedure and the sedation options and                         risks were discussed with the patient. All questions                         were answered and informed consent was obtained.                         Patient identification and proposed procedure were                         verified by the physician, the nurse, the                         anesthesiologist, the anesthetist and the technician                         in the pre-procedure area in the procedure room in the                         endoscopy suite. Mental Status Examination: alert and                         oriented. Airway Examination: normal oropharyngeal                         airway and neck mobility. Respiratory Examination:  clear to auscultation. CV Examination: normal.                         Prophylactic Antibiotics: The patient does not require                          prophylactic antibiotics. Prior Anticoagulants: The                         patient has taken no previous anticoagulant or                         antiplatelet agents. ASA Grade Assessment: II - A                         patient with mild systemic disease. After reviewing                         the risks and benefits, the patient was deemed in                         satisfactory condition to undergo the procedure. The                         anesthesia plan was to use general anesthesia.                         Immediately prior to administration of medications,                         the patient was re-assessed for adequacy to receive                         sedatives. The heart rate, respiratory rate, oxygen                         saturations, blood pressure, adequacy of pulmonary                         ventilation, and response to care were monitored                         throughout the procedure. The physical status of the                         patient was re-assessed after the procedure.                        After obtaining informed consent, the colonoscope was                         passed under direct vision. Throughout the procedure,                         the patient's blood pressure, pulse, and oxygen                         saturations were monitored continuously. The  Colonoscope was introduced through the anus and                         advanced to the the terminal ileum, with                         identification of the appendiceal orifice and IC                         valve. The colonoscopy was performed without                         difficulty. The patient tolerated the procedure well.                         The quality of the bowel preparation was evaluated                         using the BBPS [facility] Bowel Preparation Scale) with                         scores of: Right Colon = 3, Transverse Colon = 3 and                          Left Colon = 3 (entire mucosa seen well with no                         residual staining, small fragments of stool or opaque                         liquid). The total BBPS score equals 9. Findings:      The perianal and digital rectal examinations were normal. Pertinent       negatives include normal sphincter tone and no palpable rectal lesions.      The terminal ileum appeared normal.      A 5 mm post polypectomy scar was found in the descending colon. The scar       tissue was healthy in appearance. There was no evidence of the previous       polyp. The scar was unremarkable in appearance.      Non-bleeding external internal hemorrhoids were found during       retroflexion. The hemorrhoids were medium-sized.      A tattoo was seen in the sigmoid colon and in the descending colon. A       post-polypectomy scar was found at the tattoo site. There was no       evidence of residual polyp tissue. Impression:            - The examined portion of the ileum was normal.                        - Post-polypectomy scar in the descending colon.                        - Non-bleeding external internal hemorrhoids.                        - A tattoo was seen in the sigmoid colon and  in the                         descending colon. A post-polypectomy scar was found at                         the tattoo site. There was no evidence of residual                         polyp tissue.                        - No specimens collected. Recommendation:        - Discharge patient to home (with escort).                        - Resume previous diet today.                        - Continue present medications.                        - Repeat colonoscopy in 10 years for screening                         purposes. Procedure Code(s):     --- Professional ---                        I6270, Colorectal cancer screening; colonoscopy on                         individual at high risk Diagnosis Code(s):     ---  Professional ---                        Z86.010, Personal history of colonic polyps                        K64.4, Residual hemorrhoidal skin tags                        K64.8, Other hemorrhoids                        Z98.890, Other specified postprocedural states CPT copyright 2019 American Medical Association. All rights reserved. The codes documented in this report are preliminary and upon coder review may  be revised to meet current compliance requirements. Dr. Ulyess Mort Lin Landsman MD, MD 10/01/2021 8:57:05 AM This report has been signed electronically. Number of Addenda: 0 Note Initiated On: 10/01/2021 8:28 AM Scope Withdrawal Time: 0 hours 8 minutes 16 seconds  Total Procedure Duration: 0 hours 9 minutes 41 seconds  Estimated Blood Loss:  Estimated blood loss: none.      [facility]

## 2021-10-02 NOTE — Anesthesia Postprocedure Evaluation (Signed)
Anesthesia Post Note  Patient: Charles Valentine  Procedure(s) Performed: COLONOSCOPY WITH PROPOFOL  Patient location during evaluation: Endoscopy Anesthesia Type: General Level of consciousness: awake and alert Pain management: pain level controlled Vital Signs Assessment: post-procedure vital signs reviewed and stable Respiratory status: spontaneous breathing, nonlabored ventilation and respiratory function stable Cardiovascular status: blood pressure returned to baseline and stable Postop Assessment: no apparent nausea or vomiting Anesthetic complications: no   No notable events documented.   Last Vitals:  Vitals:   10/01/21 0857 10/01/21 0907  BP: 113/78 129/70  Pulse: (!) 49 (!) 47  Resp: 15 13  Temp: (!) 35.8 C   SpO2: 97% 100%    Last Pain:  Vitals:   10/02/21 0737  TempSrc:   PainSc: 0-No pain                 [place] Ouch

## 2021-10-07 ENCOUNTER — Encounter: Payer: Self-pay | Admitting: Gastroenterology

## 2021-12-08 ENCOUNTER — Other Ambulatory Visit: Payer: Self-pay | Admitting: Family Medicine

## 2021-12-08 DIAGNOSIS — I10 Essential (primary) hypertension: Secondary | ICD-10-CM

## 2022-03-04 NOTE — Progress Notes (Unsigned)
Name: Charles Valentine   MRN: [id number]    DOB: 1956-03-03   Date:03/05/2022       Progress Note  Subjective  Chief Complaint  Follow Up  HPI  HTN: diagnosed in 2018, he was  Norvasc 10 mg also tried Exforge but switched  to Bystolic because of tachycardia, tolerating 20 mg daily. He denies chest pain, dizziness or  SOB  Echo 08/2016 :  - Left ventricle: The cavity size was normal. Wall thickness was   normal. Systolic function was normal. The estimated ejection   fraction was in the range of 60% to 65%. Wall motion was normal;   there were no regional wall motion abnormalities. Left   ventricular diastolic function parameters were normal.   Hyperlipidemia/Atherosclerosis aorta : plaques found on CT done 06/2020  he states has been more compliant with diet    ED: he is taking Cialis occasionally,doing well on medication, no side effects . He has enough at the pharmacy   Renal cyst: during imaging for diarrhea he was found to have a renal cyst, US showed possible complex cyst, but MRI was benign - unchanged   MRI abdomen done 08/09/2020:   2.3 x 1.6 cm cyst with a single thin septation in the posterior interpolar right kidney (series 19/image 22). No solid component or enhancement following contrast administration. 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  Based on UptoDate, considered benign and no further work up required  Patient Active Problem List   Diagnosis Date Noted   History of colonic polyps    Cyst of right kidney 09/02/2021   Screening for colon cancer    Atherosclerosis of abdominal aorta ([place]) 07/25/2020   Diverticulosis 07/25/2020   Sinus tachycardia 09/02/2016   Essential hypertension 09/02/2016   Eczema 06/08/2016   Hypercholesterolemia 04/08/2016   ED (erectile dysfunction)     Past Surgical History:  Procedure Laterality Date   COLONOSCOPY WITH PROPOFOL N/A 09/18/2020   Procedure: COLONOSCOPY WITH PROPOFOL;  Surgeon: Lin Landsman, MD;  Location: [place];  Service: Gastroenterology;  Laterality: N/A;   COLONOSCOPY WITH PROPOFOL N/A 10/01/2021   Procedure: COLONOSCOPY WITH PROPOFOL;  Surgeon: Lin Landsman, MD;  Location: [facility] ENDOSCOPY;  Service: Gastroenterology;  Laterality: N/A;    Family History  Problem Relation Age of Onset   Hypertension Mother    Diabetes Mother    Heart disease Mother 12       CABG   Heart attack Mother 50   Obesity Sister    Drug abuse Brother     Social History   Tobacco Use   Smoking status: Former    Packs/day: 0.50    Years: 10.00    Total pack years: 5.00    Types: Cigarettes    Quit date: 1989    Years since quitting: 34.8   Smokeless tobacco: Never  Substance Use Topics   Alcohol use: Yes    Alcohol/week: 8.0 standard drinks of alcohol    Types: 6 Cans of beer, 2 Shots of liquor per week     Current Outpatient Medications:    Nebivolol HCl 20 MG TABS, TAKE ONE TABLET BY MOUTH ONE TIME DAILY, Disp: 90 tablet, Rfl: 0   rosuvastatin (CRESTOR) 20 MG tablet, Take 1 tablet (20 mg total) by mouth daily., Disp: 90 tablet, Rfl: 1   tadalafil (CIALIS) 20 MG tablet, Take 1 tablet (20 mg total) by mouth daily as needed for erectile dysfunction., Disp: 90 tablet, Rfl: 0  No Known Allergies  I personally reviewed active problem list, medication list, allergies, family history, social history, health maintenance with the patient/caregiver today.   ROS  Constitutional: Negative for fever or weight change.  Respiratory: Negative for cough and shortness of breath.   Cardiovascular: Negative for chest pain or palpitations.  Gastrointestinal: Negative for abdominal pain, no bowel changes.  Musculoskeletal: Negative for gait problem or joint swelling.  Skin: Negative for rash.  Neurological: Negative for dizziness or headache.  No other specific complaints in a complete review of systems (except as listed in HPI above).   Objective  Vitals:   03/05/22  0740  BP: 132/74  Pulse: 81  Resp: 16  SpO2: 98%  Weight: 188 lb (85.3 kg)  Height: 6' (1.829 m)    Body mass index is 25.5 kg/m.  Physical Exam  Constitutional: Patient appears well-developed and well-nourished.  No distress.  HEENT: head atraumatic, normocephalic, pupils equal and reactive to light, neck supple Cardiovascular: Normal rate, regular rhythm and normal heart sounds.  No murmur heard. No BLE edema. Pulmonary/Chest: Effort normal and breath sounds normal. No respiratory distress. Abdominal: Soft.  There is no tenderness. Psychiatric: Patient has a normal mood and affect. behavior is normal. Judgment and thought content normal.    PHQ2/9:    03/05/2022    7:40 AM 09/02/2021    7:48 AM 06/30/2021    8:17 AM 03/04/2021    7:39 AM 08/28/2020    8:02 AM  Depression screen PHQ 2/9  Decreased Interest 0 0 0 0 0  Down, Depressed, Hopeless 0 0 0 0 0  PHQ - 2 Score 0 0 0 0 0  Altered sleeping 0  0 0   Tired, decreased energy 0  0 0   Change in appetite 0  0 0   Feeling bad or failure about yourself  0  0 0   Trouble concentrating 0  0 0   Moving slowly or fidgety/restless 0  0 0   Suicidal thoughts 0  0 0   PHQ-9 Score 0  0 0     phq 9 is negative   Fall Risk:    03/05/2022    7:40 AM 09/02/2021    7:47 AM 06/30/2021    8:16 AM 03/04/2021    7:39 AM 08/28/2020    8:02 AM  Fall Risk   Falls in the past year? 0 0 0 0 0  Number falls in past yr: 0  0 0 0  Injury with Fall? 0  0 0 0  Risk for fall due to : No Fall Risks No Fall Risks No Fall Risks No Fall Risks   Follow up Falls prevention discussed Falls prevention discussed Falls prevention discussed Falls prevention discussed       Functional Status Survey: Is the patient deaf or have difficulty hearing?: No Does the patient have difficulty seeing, even when wearing glasses/contacts?: No Does the patient have difficulty concentrating, remembering, or making decisions?: No Does the patient have  difficulty walking or climbing stairs?: No Does the patient have difficulty dressing or bathing?: No Does the patient have difficulty doing errands alone such as visiting a doctor's office or shopping?: No    Assessment & Plan  1. Atherosclerosis of abdominal aorta (HCC)  - rosuvastatin (CRESTOR) 20 MG tablet; Take 1 tablet (20 mg total) by mouth daily.  Dispense: 90 tablet; Refill: 1  2. Other male erectile dysfunction   3. Hypercholesterolemia  - rosuvastatin (CRESTOR) 20 MG  tablet; Take 1 tablet (20 mg total) by mouth daily.  Dispense: 90 tablet; Refill: 1  4. Essential hypertension  - Nebivolol HCl 20 MG TABS; Take 1 tablet (20 mg total) by mouth daily.  Dispense: 90 tablet; Refill: 1  5. Need for immunization against influenza  - Flu Vaccine QUAD 6+ mos PF IM (Fluarix Quad PF)  6. Cyst of right kidney

## 2022-03-05 ENCOUNTER — Ambulatory Visit: Payer: No Typology Code available for payment source | Admitting: Family Medicine

## 2022-03-05 ENCOUNTER — Encounter: Payer: Self-pay | Admitting: Family Medicine

## 2022-03-05 VITALS — BP 132/74 | HR 81 | Resp 16 | Ht 72.0 in | Wt 188.0 lb

## 2022-03-05 DIAGNOSIS — I10 Essential (primary) hypertension: Secondary | ICD-10-CM

## 2022-03-05 DIAGNOSIS — E7800 Pure hypercholesterolemia, unspecified: Secondary | ICD-10-CM | POA: Diagnosis not present

## 2022-03-05 DIAGNOSIS — N281 Cyst of kidney, acquired: Secondary | ICD-10-CM

## 2022-03-05 DIAGNOSIS — N528 Other male erectile dysfunction: Secondary | ICD-10-CM

## 2022-03-05 DIAGNOSIS — I700 Atherosclerosis of aorta: Secondary | ICD-10-CM

## 2022-03-05 DIAGNOSIS — Z23 Encounter for immunization: Secondary | ICD-10-CM | POA: Diagnosis not present

## 2022-03-05 MED ORDER — ROSUVASTATIN CALCIUM 20 MG PO TABS: 20.0000 mg | ORAL_TABLET | Freq: Every day | ORAL | 1 refills | Status: DC

## 2022-03-05 MED ORDER — NEBIVOLOL HCL 20 MG PO TABS: 1.0000 | ORAL_TABLET | Freq: Every day | ORAL | 1 refills | Status: DC

## 2022-05-23 IMAGING — MR MR ABDOMEN WO/W CM
18 series · 48 of 48 positions shown · IV contrast (7.5ml Gadavist)
Comparison: Renal ultrasound and CT abdomen/pelvis dated 07/17/2020

CLINICAL DATA: Complex right renal cyst

EXAM:
MRI ABDOMEN WITHOUT AND WITH CONTRAST
TECHNIQUE: Multiplanar multisequence MR imaging of the abdomen was performed
both before and after the administration of intravenous contrast.
CONTRAST:  7.5mL GADAVIST GADOBUTROL 1 MMOL/ML IV SOLN

[Series 3: cor haste · coronal · 6.0mm · 1.19mm/px · 2 of 32 slices shown]
[im 1/32]
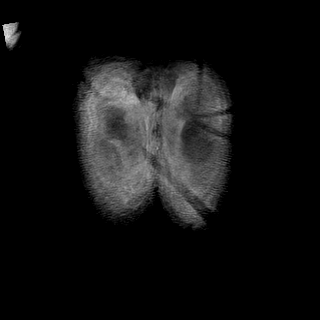
[im 32/32]
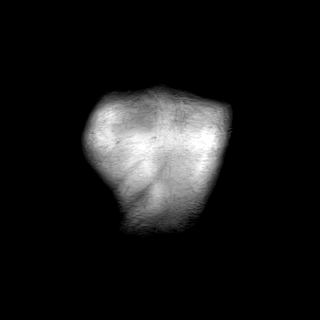

[Series 5: T2 fat-sat · axial · 6.0mm · 1.19mm/px · z∈[-96,+127]mm · 2 of 32 slices shown]
[im 1/32]
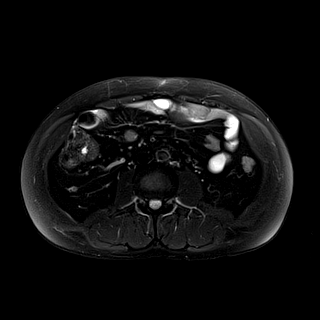
[im 32/32]
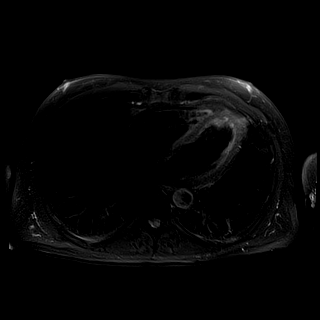

[Series 7: ax in & · axial · 3.0mm · 1.19mm/px · z∈[-91,+122]mm · 4 of 72 slices shown]
[im 1/72]
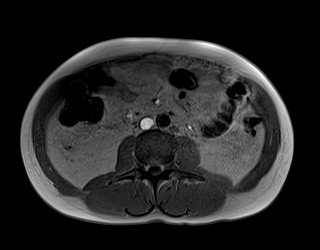
[im 24/72]
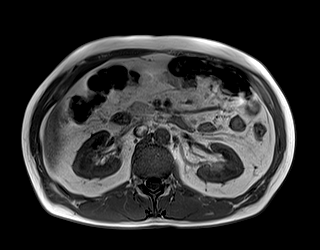
[im 48/72]
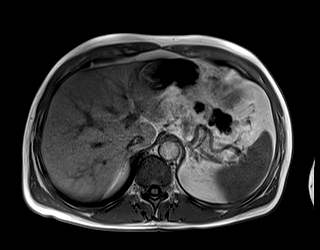
[im 72/72]
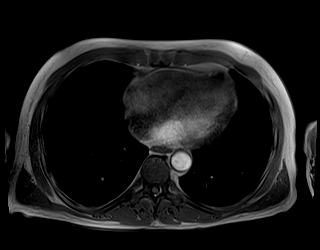

[Series 8: bSSFP · axial · 6.0mm · 0.74mm/px · 1 of 32 slices shown]
[im 1/32]
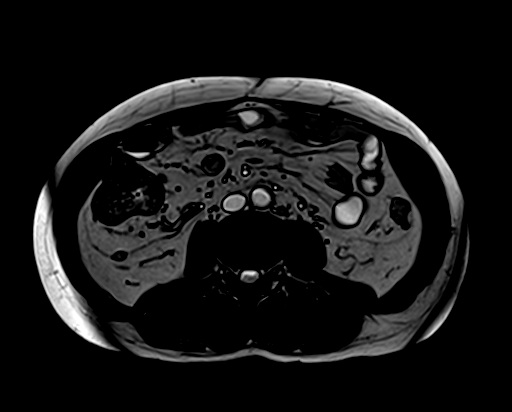

[Series 9: DWI · axial · 6.0mm · 1.42mm/px · z∈[-96,+127]mm · 4 of 96 slices shown (1 of 2)]
[im 1/96]
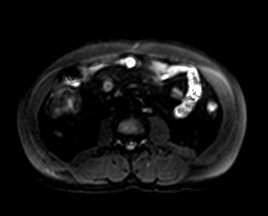
[im 32/96]
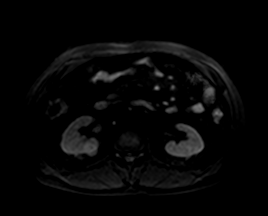
[im 64/96]
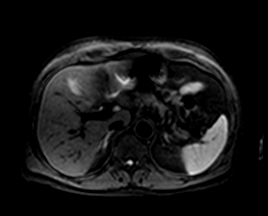
[im 96/96]
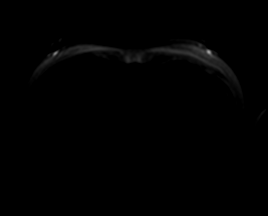

[Series 10: DWI · axial · 6.0mm · 1.42mm/px · 1 of 32 slices shown (2 of 2)]
[im 1/32]
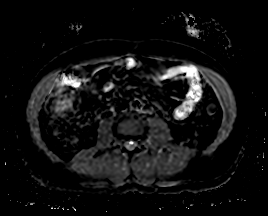

[Series 11: T1 dynamic · axial · non-contrast · 3.0mm · 1.19mm/px · z∈[-91,+122]mm · 3 of 72 slices shown (1 of 9)]
[im 1/72]
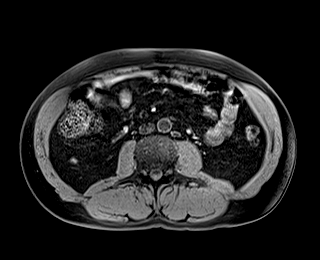
[im 36/72]
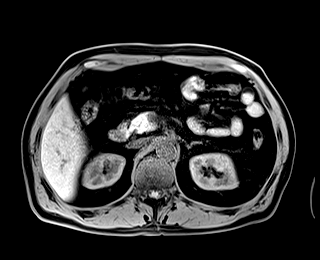
[im 72/72]
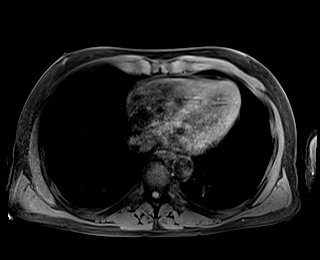

[Series 12: T1 dynamic · axial · 3.0mm · 1.19mm/px · z∈[-91,+122]mm · 3 of 72 slices shown (2 of 9)]
[im 1/72]
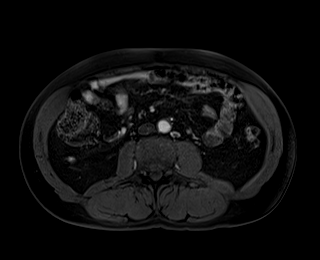
[im 36/72]
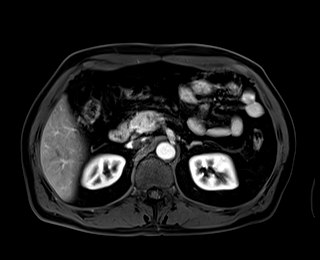
[im 72/72]
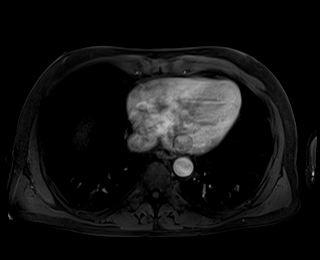

[Series 13: T1 dynamic · axial · 3.0mm · 1.19mm/px · z∈[-91,+122]mm · 3 of 72 slices shown (3 of 9)]
[im 1/72]
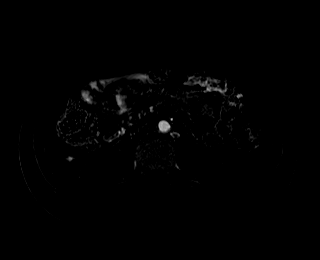
[im 36/72]
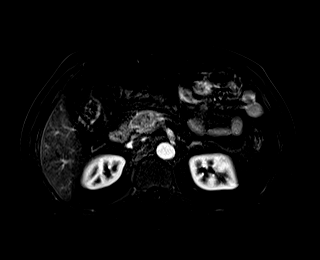
[im 72/72]
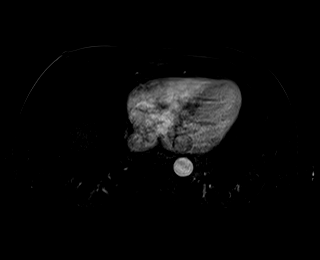

[Series 14: T1 dynamic · axial · 3.0mm · 1.19mm/px · z∈[-91,+122]mm · 3 of 72 slices shown (4 of 9)]
[im 1/72]
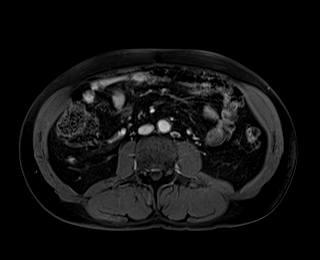
[im 36/72]
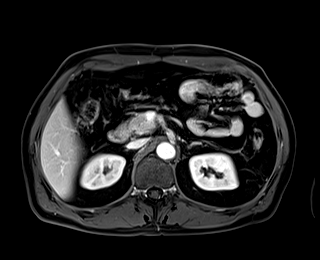
[im 72/72]
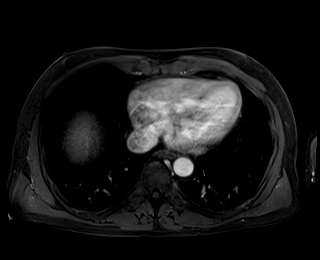

[Series 15: T1 dynamic · axial · 3.0mm · 1.19mm/px · z∈[-91,+122]mm · 3 of 72 slices shown (5 of 9)]
[im 1/72]
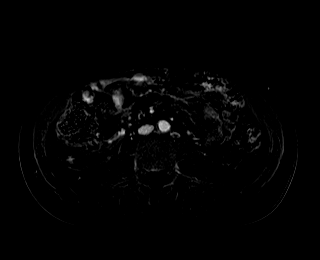
[im 36/72]
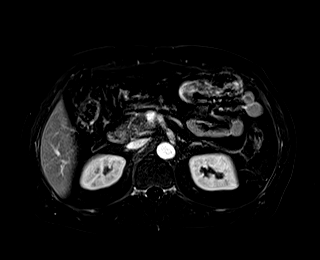
[im 72/72]
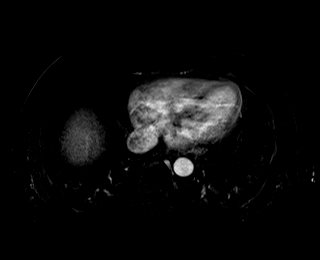

[Series 16: T1 dynamic · axial · 3.0mm · 1.19mm/px · z∈[-91,+122]mm · 3 of 72 slices shown (6 of 9)]
[im 1/72]
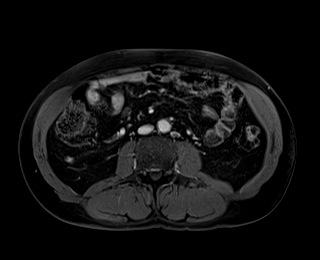
[im 36/72]
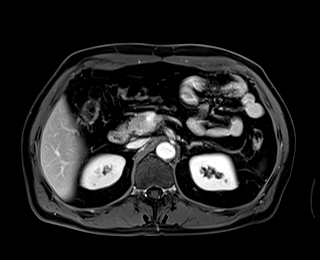
[im 72/72]
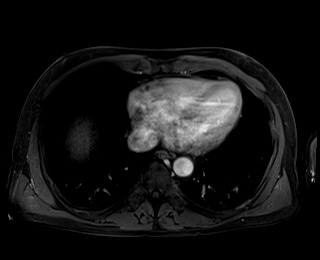

[Series 17: T1 dynamic · axial · 3.0mm · 1.19mm/px · z∈[-91,+122]mm · 3 of 72 slices shown (7 of 9)]
[im 1/72]
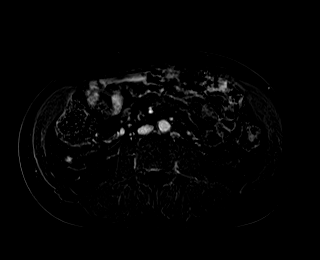
[im 36/72]
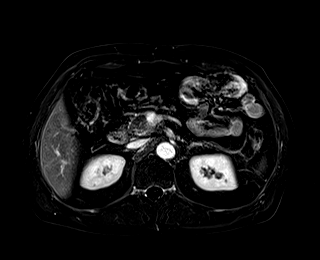
[im 72/72]
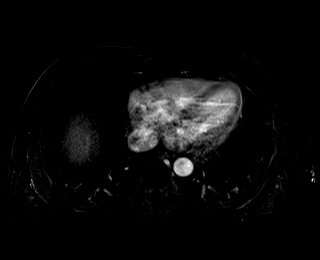

[Series 18: T1 dynamic post-contrast · coronal · 3.0mm · 1.31mm/px · 3 of 80 slices shown]
[im 1/80]
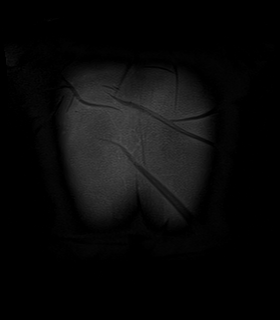
[im 40/80]
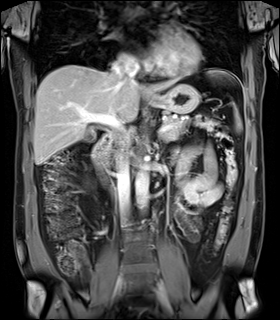
[im 80/80]
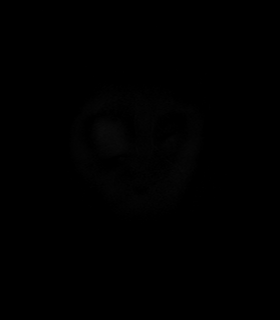

[Series 19: T2 · axial · 6.0mm · 1.19mm/px · 1 of 32 slices shown]
[im 1/32]
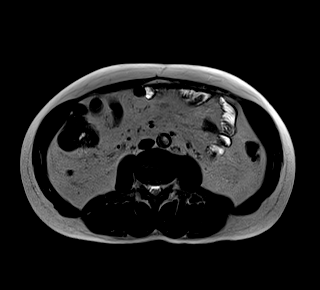

[Series 20: T1 dynamic · axial · 3.0mm · 1.19mm/px · z∈[-91,+122]mm · 3 of 72 slices shown (8 of 9)]
[im 1/72]
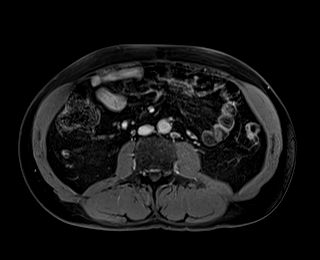
[im 36/72]
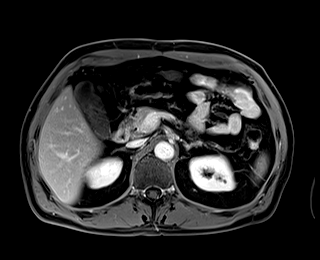
[im 72/72]
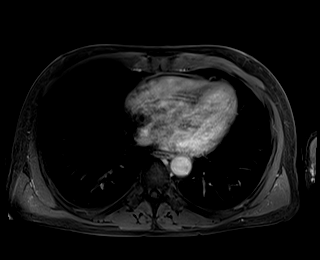

[Series 21: T1 dynamic · axial · 3.0mm · 1.19mm/px · z∈[-91,+122]mm · 3 of 72 slices shown (9 of 9)]
[im 1/72]
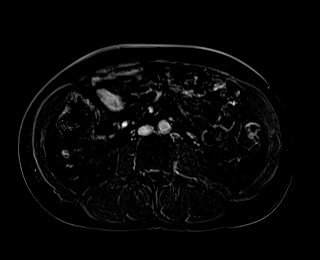
[im 36/72]
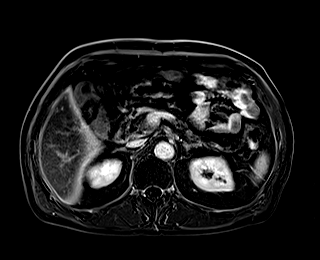
[im 72/72]
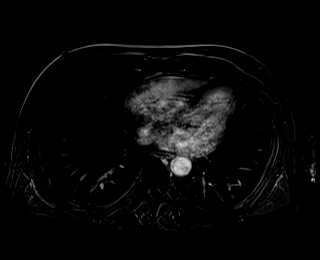

[Series 1011: out of phase · axial · 3.0mm · 1.19mm/px · z∈[-91,+122]mm · 3 of 72 slices shown]
[im 1/72]
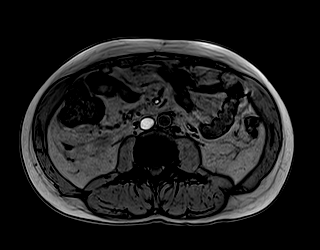
[im 36/72]
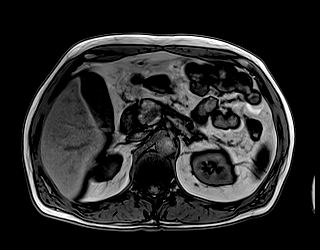
[im 72/72]
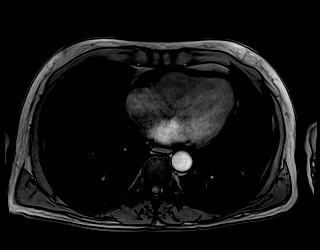

[48 of 48 positions shown; findings below may reference images not displayed]

FINDINGS: Lower chest: Lung bases are clear.

Hepatobiliary: Liver is within normal limits.

Gallbladder is unremarkable. No intrahepatic or extrahepatic ductal
dilatation.

Pancreas:  Within normal limits.

Spleen:  Within normal limits.

Adrenals/Urinary Tract:  Adrenal glands are within normal limits.

2.3 x 1.6 cm cyst with a single thin septation in the posterior
interpolar right kidney (series 19/image 22). No solid component or
enhancement following contrast administration. This is compatible
with a benign renal cyst (Bosniak II).

Additional subcentimeter bilateral renal cysts.  No hydronephrosis.

Stomach/Bowel: Stomach is within normal limits.

Visualized bowel is unremarkable.

Vascular/Lymphatic:  No evidence of abdominal aortic aneurysm.

No suspicious abdominal lymphadenopathy.

Other:  No abdominal ascites.

Musculoskeletal: No focal osseous lesions.
IMPRESSION: 2.3 cm minimally complex posterior interpolar right renal cyst,
benign (Bosniak II).

## 2022-07-02 NOTE — Patient Instructions (Signed)

## 2022-07-02 NOTE — Progress Notes (Signed)
Name: Charles Valentine   MRN: [id number]    DOB: November 08, 1955   Date:07/03/2022       Progress Note  Subjective  Chief Complaint  Annual Exam  HPI  Patient presents for annual CPE.  IPSS Questionnaire (AUA-7): Over the past month.   1)  How often have you had a sensation of not emptying your bladder completely after you finish urinating?  0 - Not at all  2)  How often have you had to urinate again less than two hours after you finished urinating? 0 - Not at all  3)  How often have you found you stopped and started again several times when you urinated?  0 - Not at all  4) How difficult have you found it to postpone urination?  0 - Not at all  5) How often have you had a weak urinary stream?  0 - Not at all  6) How often have you had to push or strain to begin urination?  0 - Not at all  7) How many times did you most typically get up to urinate from the time you went to bed until the time you got up in the morning?  1 - 1 time  Total score:  0-7 mildly symptomatic   8-19 moderately symptomatic   20-35 severely symptomatic     Diet: he has been out more than often, discussed importance of switching to meals at home  Exercise: discussed 150 minutes per week  Last Dental Exam: not up to date  Last Eye Exam:he will schedule it   Depression: phq 9 is negative    07/03/2022    7:41 AM 03/05/2022    7:40 AM 09/02/2021    7:48 AM 06/30/2021    8:17 AM 03/04/2021    7:39 AM  Depression screen PHQ 2/9  Decreased Interest 0 0 0 0 0  Down, Depressed, Hopeless 0 0 0 0 0  PHQ - 2 Score 0 0 0 0 0  Altered sleeping 0 0  0 0  Tired, decreased energy 0 0  0 0  Change in appetite 0 0  0 0  Feeling bad or failure about yourself  0 0  0 0  Trouble concentrating 0 0  0 0  Moving slowly or fidgety/restless 0 0  0 0  Suicidal thoughts 0 0  0 0  PHQ-9 Score 0 0  0 0    Hypertension:  BP Readings from Last 3 Encounters:  07/03/22 130/72  03/05/22 132/74  10/01/21 129/70    Obesity: Wt  Readings from Last 3 Encounters:  07/03/22 187 lb (84.8 kg)  03/05/22 188 lb (85.3 kg)  10/01/21 184 lb (83.5 kg)   BMI Readings from Last 3 Encounters:  07/03/22 25.36 kg/m  03/05/22 25.50 kg/m  10/01/21 24.61 kg/m     Lipids:  Lab Results  Component Value Date   CHOL 161 06/30/2021   CHOL 128 08/28/2020   CHOL 149 07/16/2020   Lab Results  Component Value Date   HDL 50 06/30/2021   HDL 43 08/28/2020   HDL 30 (L) 07/16/2020   Lab Results  Component Value Date   LDLCALC 92 06/30/2021   LDLCALC 70 08/28/2020   LDLCALC 91 07/16/2020   Lab Results  Component Value Date   TRIG 92 06/30/2021   TRIG 70 08/28/2020   TRIG 188 (H) 07/16/2020   Lab Results  Component Value Date   CHOLHDL 3.2 06/30/2021   CHOLHDL 3.0 08/28/2020  CHOLHDL 5.0 (H) 07/16/2020   No results found for: "LDLDIRECT" Glucose:  Glucose, Bld  Date Value Ref Range Status  06/30/2021 101 (H) 65 - 99 mg/dL Final    Comment:    .            Fasting reference interval . For someone without known diabetes, a glucose value between 100 and 125 mg/dL is consistent with prediabetes and should be confirmed with a follow-up test. .   08/28/2020 99 65 - 99 mg/dL Final    Comment:    .            Fasting reference interval .   07/25/2020 98 65 - 99 mg/dL Final    Comment:    .            Fasting reference interval .     [facility] Office Visit from 06/30/2021 in [facility]  AUDIT-C Score 3      Married STD testing and prevention (HIV/chl/gon/syphilis): 07/17/20 Sexual history:  Hep C Screening: 02/28/13 Skin cancer: Discussed monitoring for atypical lesions Colorectal cancer: 10/01/21 Prostate cancer:   Lab Results  Component Value Date   PSA 0.90 06/30/2021   PSA 0.7 06/08/2016     Lung cancer:  Low Dose CT Chest recommended if Age 12-80 years, 30 pack-year currently smoking OR have quit w/in 15years. Patient  no a candidate for screening   AAA:  The USPSTF recommends one-time screening with ultrasonography in men ages 85 to 88 years who have ever smoked. We will wait for welcome to medicare to order it  ECG:  03/16/17  Vaccines:   RSV: discussed with patient  Tdap: up to date Shingrix: up to date Pneumonia: N/A Flu: up to date COVID-63: up to date  Advanced Care Planning: A voluntary discussion about advance care planning including the explanation and discussion of advance directives.  Discussed health care proxy and Living will, and the patient was able to identify a health care proxy as wife .  Patient does not have a living will and power of attorney of health care   Patient Active Problem List   Diagnosis Date Noted   History of colonic polyps    Cyst of right kidney 09/02/2021   Screening for colon cancer    Atherosclerosis of abdominal aorta ([place]) 07/25/2020   Diverticulosis 07/25/2020   Sinus tachycardia 09/02/2016   Essential hypertension 09/02/2016   Eczema 06/08/2016   Hypercholesterolemia 04/08/2016   ED (erectile dysfunction)     Past Surgical History:  Procedure Laterality Date   COLONOSCOPY WITH PROPOFOL N/A 09/18/2020   Procedure: COLONOSCOPY WITH PROPOFOL;  Surgeon: Lin Landsman, MD;  Location: [place];  Service: Gastroenterology;  Laterality: N/A;   COLONOSCOPY WITH PROPOFOL N/A 10/01/2021   Procedure: COLONOSCOPY WITH PROPOFOL;  Surgeon: Lin Landsman, MD;  Location[facility] ENDOSCOPY;  Service: Gastroenterology;  Laterality: N/A;    Family History  Problem Relation Age of Onset   Hypertension Mother    Diabetes Mother    Heart disease Mother 59       CABG   Heart attack Mother 69   Obesity Sister    Drug abuse Brother     Social History   Socioeconomic History   Marital status: Married    Spouse name: [name]   Number of children: 2   Years of education: Not on file   Highest education level: 12th grade  Occupational History   Occupation: Glass blower/designer  Comment:  [employer]  Tobacco Use   Smoking status: Former    Packs/day: 0.50    Years: 10.00    Additional pack years: 0.00    Total pack years: 5.00    Types: Cigarettes    Quit date: 1989    Years since quitting: 35.2   Smokeless tobacco: Never  Vaping Use   Vaping Use: Never used  Substance and Sexual Activity   Alcohol use: Yes    Alcohol/week: 8.0 standard drinks of alcohol    Types: 6 Cans of beer, 2 Shots of liquor per week   Drug use: No   Sexual activity: Yes    Partners: Female    Birth control/protection: None  Other Topics Concern   Not on file  Social History Narrative   Married    He has two grown son's and 3 grandchildren   Retired October 2020    Social Determinants of Health   Financial Resource Strain: [place]  (07/03/2022)   Overall Financial Resource Strain (CARDIA)    Difficulty of Paying Living Expenses: Not hard at all  Food Insecurity: No Food Insecurity (07/03/2022)   Hunger Vital Sign    Worried About Running Out of Food in the Last Year: Never true    [place] in the Last Year: Never true  Transportation Needs: No Transportation Needs (07/03/2022)   PRAPARE - Hydrologist (Medical): No    Lack of Transportation (Non-Medical): No  Physical Activity: Insufficiently Active (07/03/2022)   Exercise Vital Sign    Days of Exercise per Week: 3 days    Minutes of Exercise per Session: 30 min  Stress: Stress Concern Present (07/03/2022)   [place]    Feeling of Stress : To some extent  Social Connections: Moderately Integrated (07/03/2022)   Social Connection and Isolation Panel [NHANES]    Frequency of Communication with Friends and Family: More than three times a week    Frequency of Social Gatherings with Friends and Family: Three times a week    Attends Religious Services: 1 to 4 times per year    Active Member of Clubs or Organizations: No     Attends Archivist Meetings: Never    Marital Status: Married  Human resources officer Violence: Not At Risk (07/03/2022)   Humiliation, Afraid, Rape, and Kick questionnaire    Fear of Current or Ex-Partner: No    Emotionally Abused: No    Physically Abused: No    Sexually Abused: No     Current Outpatient Medications:    Nebivolol HCl 20 MG TABS, Take 1 tablet (20 mg total) by mouth daily., Disp: 90 tablet, Rfl: 1   rosuvastatin (CRESTOR) 20 MG tablet, Take 1 tablet (20 mg total) by mouth daily., Disp: 90 tablet, Rfl: 1   tadalafil (CIALIS) 20 MG tablet, Take 1 tablet (20 mg total) by mouth daily as needed for erectile dysfunction., Disp: 90 tablet, Rfl: 0  No Known Allergies   ROS  Constitutional: Negative for fever or weight change.  Respiratory: Negative for cough and shortness of breath.   Cardiovascular: Negative for chest pain or palpitations.  Gastrointestinal: Negative for abdominal pain, no bowel changes.  Musculoskeletal: Negative for gait problem or joint swelling.  Skin: Negative for rash.  Neurological: Negative for dizziness or headache.  No other specific complaints in a complete review of systems (except as listed in HPI above).  Objective  Vitals:   07/03/22 0741  BP: 130/72  Pulse: 70  Resp: 16  SpO2: 98%  Weight: 187 lb (84.8 kg)  Height: 6' (1.829 m)    Body mass index is 25.36 kg/m.  Physical Exam  Constitutional: Patient appears well-developed and well-nourished. No distress.  HENT: Head: Normocephalic and atraumatic. Ears: B TMs ok, no erythema or effusion; Nose: Nose normal. Mouth/Throat: Oropharynx is clear and moist. No oropharyngeal exudate.  Eyes: Conjunctivae and EOM are normal. Pupils are equal, round, and reactive to light. No scleral icterus.  Neck: Normal range of motion. Neck supple. No JVD present. No thyromegaly present.  Cardiovascular: Normal rate, regular rhythm and normal heart sounds.  No murmur heard. No BLE  edema. Pulmonary/Chest: Effort normal and breath sounds normal. No respiratory distress. Abdominal: Soft. Bowel sounds are normal, no distension. There is no tenderness. no masses MALE GENITALIA: Normal descended testes bilaterally, no masses palpated, no hernias, no lesions, no discharge RECTAL: not done  Musculoskeletal: Normal range of motion, no joint effusions. No gross deformities Neurological: he is alert and oriented to person, place, and time. No cranial nerve deficit. Coordination, balance, strength, speech and gait are normal.  Skin: Skin is warm and dry. No rash noted. No erythema.  Psychiatric: Patient has a normal mood and affect. behavior is normal. Judgment and thought content normal.   Fall Risk:    07/03/2022    7:40 AM 03/05/2022    7:40 AM 09/02/2021    7:47 AM 06/30/2021    8:16 AM 03/04/2021    7:39 AM  Fall Risk   Falls in the past year? 0 0 0 0 0  Number falls in past yr: 0 0  0 0  Injury with Fall? 0 0  0 0  Risk for fall due to : No Fall Risks No Fall Risks No Fall Risks No Fall Risks No Fall Risks  Follow up Falls prevention discussed Falls prevention discussed Falls prevention discussed Falls prevention discussed Falls prevention discussed     Functional Status Survey: Is the patient deaf or have difficulty hearing?: No Does the patient have difficulty seeing, even when wearing glasses/contacts?: No Does the patient have difficulty concentrating, remembering, or making decisions?: No Does the patient have difficulty walking or climbing stairs?: No Does the patient have difficulty dressing or bathing?: No Does the patient have difficulty doing errands alone such as visiting a doctor's office or shopping?: No    Assessment & Plan  1. Well adult exam  - Lipid panel - CBC with Differential/Platelet - COMPLETE METABOLIC PANEL WITH GFR - Hemoglobin A1c  2. Essential hypertension  - CBC with Differential/Platelet - COMPLETE METABOLIC PANEL WITH  GFR  3. Atherosclerosis of abdominal aorta (HCC)  - Lipid panel  4. Diabetes mellitus screening  - Hemoglobin A1c  5. History of anemia  - CBC with Differential/Platelet    -Prostate cancer screening and PSA options (with potential risks and benefits of testing vs not testing) were discussed along with recent recs/guidelines. -USPSTF grade A and B recommendations reviewed with patient; age-appropriate recommendations, preventive care, screening tests, etc discussed and encouraged; healthy living encouraged; see AVS for patient education given to patient -Discussed importance of 150 minutes of physical activity weekly, eat two servings of fish weekly, eat one serving of tree nuts ( cashews, pistachios, pecans, almonds.Marland Kitchen) every other day, eat 6 servings of fruit/vegetables daily and drink plenty of water and avoid sweet beverages.  -Reviewed Health Maintenance: yes

## 2022-07-03 ENCOUNTER — Ambulatory Visit (INDEPENDENT_AMBULATORY_CARE_PROVIDER_SITE_OTHER): Payer: No Typology Code available for payment source | Admitting: Family Medicine

## 2022-07-03 ENCOUNTER — Encounter: Payer: Self-pay | Admitting: Family Medicine

## 2022-07-03 VITALS — BP 130/72 | HR 70 | Resp 16 | Ht 72.0 in | Wt 187.0 lb

## 2022-07-03 DIAGNOSIS — Z862 Personal history of diseases of the blood and blood-forming organs and certain disorders involving the immune mechanism: Secondary | ICD-10-CM

## 2022-07-03 DIAGNOSIS — Z0000 Encounter for general adult medical examination without abnormal findings: Secondary | ICD-10-CM

## 2022-07-03 DIAGNOSIS — I700 Atherosclerosis of aorta: Secondary | ICD-10-CM

## 2022-07-03 DIAGNOSIS — Z131 Encounter for screening for diabetes mellitus: Secondary | ICD-10-CM

## 2022-07-03 DIAGNOSIS — I10 Essential (primary) hypertension: Secondary | ICD-10-CM

## 2022-07-04 LAB — COMPLETE METABOLIC PANEL WITH GFR
AG Ratio: 1.3 (calc) (ref 1.0–2.5)
ALT: 25 U/L (ref 9–46)
AST: 19 U/L (ref 10–35)
Albumin: 4.4 g/dL (ref 3.6–5.1)
Alkaline phosphatase (APISO): 53 U/L (ref 35–144)
BUN: 11 mg/dL (ref 7–25)
CO2: 24 mmol/L (ref 20–32)
Calcium: 9.5 mg/dL (ref 8.6–10.3)
Chloride: 105 mmol/L (ref 98–110)
Creat: 1.08 mg/dL (ref 0.70–1.35)
Globulin: 3.4 g/dL (calc) (ref 1.9–3.7)
Glucose, Bld: 96 mg/dL (ref 65–99)
Potassium: 4.4 mmol/L (ref 3.5–5.3)
Sodium: 140 mmol/L (ref 135–146)
Total Bilirubin: 0.4 mg/dL (ref 0.2–1.2)
Total Protein: 7.8 g/dL (ref 6.1–8.1)
eGFR: 77 mL/min/{1.73_m2} (ref >=60)

## 2022-07-04 LAB — CBC WITH DIFFERENTIAL/PLATELET
Absolute Monocytes: 641 cells/uL (ref 200–950)
Basophils Absolute: 31 cells/uL (ref 0–200)
Basophils Relative: 0.5 %
Eosinophils Absolute: 79 cells/uL (ref 15–500)
Eosinophils Relative: 1.3 %
HCT: 42.8 % (ref 38.5–50.0)
Hemoglobin: 14 g/dL (ref 13.2–17.1)
Lymphs Abs: 1397 cells/uL (ref 850–3900)
MCH: 29.2 pg (ref 27.0–33.0)
MCHC: 32.7 g/dL (ref 32.0–36.0)
MCV: 89.2 fL (ref 80.0–100.0)
MPV: 10.6 fL (ref 7.5–12.5)
Monocytes Relative: 10.5 %
Neutro Abs: 3953 cells/uL (ref 1500–7800)
Neutrophils Relative %: 64.8 %
Platelets: 225 10*3/uL (ref 140–400)
RBC: 4.8 10*6/uL (ref 4.20–5.80)
RDW: 12.7 % (ref 11.0–15.0)
Total Lymphocyte: 22.9 %
WBC: 6.1 10*3/uL (ref 3.8–10.8)

## 2022-07-04 LAB — HEMOGLOBIN A1C
Hgb A1c MFr Bld: 5.8 % of total Hgb — ABNORMAL HIGH (ref <5.7)
Mean Plasma Glucose: 120 mg/dL
eAG (mmol/L): 6.6 mmol/L

## 2022-07-04 LAB — LIPID PANEL
Cholesterol: 119 mg/dL (ref <200)
HDL: 47 mg/dL (ref >=40)
LDL Cholesterol (Calc): 57 mg/dL (calc)
Non-HDL Cholesterol (Calc): 72 mg/dL (calc) (ref <130)
Total CHOL/HDL Ratio: 2.5 (calc) (ref <5.0)
Triglycerides: 73 mg/dL (ref <150)

## 2022-09-02 NOTE — Progress Notes (Unsigned)
Name: Charles Valentine   MRN: [id number]    DOB: Jan 18, 1956   Date:09/03/2022       Progress Note  Subjective  Chief Complaint  Follow Up  HPI  HTN: diagnosed in 2018, he was  Norvasc 10 mg also tried Exforge but switched  to Bystolic because of tachycardia, tolerating 20 mg daily. He denies chest pain, dizziness or  SOB. He states occasionally checks his bp at home and usually in the 130's range   Echo 08/2016 :  - Left ventricle: The cavity size was normal. Wall thickness was   normal. Systolic function was normal. The estimated ejection   fraction was in the range of 60% to 65%. Wall motion was normal;   there were no regional wall motion abnormalities. Left   ventricular diastolic function parameters were normal.   Hyperlipidemia/Atherosclerosis aorta : plaques found on CT done 06/2020  he is now taking Rosuvastatin daily and denies side effects . LDL was 57    ED: he is taking Cialis occasionally,doing well on medication, no side effects . He needs a refill today   Pre diabetes: last A1C was 5.8 % He denies polyphagia, polydipsia or polyuria. He is cutting down on sweets. Substituting pastries and cakes for fruit  Renal cyst: during imaging for diarrhea he was found to have a renal cyst, US showed possible complex cyst, but MRI was benign - unchanged   MRI abdomen done 08/09/2020:   2.3 x 1.6 cm cyst with a single thin septation in the posterior interpolar right kidney (series 19/image 22). No solid component or enhancement following contrast administration. 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  Based on UptoDate, considered benign and no further work up required. Unchanged   Patient Active Problem List   Diagnosis Date Noted   History of colonic polyps    Cyst of right kidney 09/02/2021   Screening for colon cancer    Atherosclerosis of abdominal aorta (HCC) 07/25/2020   Diverticulosis 07/25/2020   Sinus tachycardia 09/02/2016   Essential  hypertension 09/02/2016   Eczema 06/08/2016   Hypercholesterolemia 04/08/2016   ED (erectile dysfunction)     Past Surgical History:  Procedure Laterality Date   COLONOSCOPY WITH PROPOFOL N/A 09/18/2020   Procedure: COLONOSCOPY WITH PROPOFOL;  Surgeon: Toney Reil, MD;  Location: [facility] ENDOSCOPY;  Service: Gastroenterology;  Laterality: N/A;   COLONOSCOPY WITH PROPOFOL N/A 10/01/2021   Procedure: COLONOSCOPY WITH PROPOFOL;  Surgeon: Toney Reil, MD;  Location: [facility] ENDOSCOPY;  Service: Gastroenterology;  Laterality: N/A;    Family History  Problem Relation Age of Onset   Hypertension Mother    Diabetes Mother    Heart disease Mother 51       CABG   Heart attack Mother 24   Obesity Sister    Drug abuse Brother     Social History   Tobacco Use   Smoking status: Former    Packs/day: 0.50    Years: 10.00    Additional pack years: 0.00    Total pack years: 5.00    Types: Cigarettes    Quit date: 1989    Years since quitting: 35.3   Smokeless tobacco: Never  Substance Use Topics   Alcohol use: Yes    Alcohol/week: 8.0 standard drinks of alcohol    Types: 6 Cans of beer, 2 Shots of liquor per week     Current Outpatient Medications:    Nebivolol HCl 20 MG TABS, Take 1 tablet (20 mg  total) by mouth daily., Disp: 90 tablet, Rfl: 1   rosuvastatin (CRESTOR) 20 MG tablet, Take 1 tablet (20 mg total) by mouth daily., Disp: 90 tablet, Rfl: 1   tadalafil (CIALIS) 20 MG tablet, Take 1 tablet (20 mg total) by mouth daily as needed for erectile dysfunction., Disp: 90 tablet, Rfl: 0  No Known Allergies  I personally reviewed active problem list, medication list, allergies, family history, social history, health maintenance with the patient/caregiver today.   ROS  Constitutional: Negative for fever or weight change.  Respiratory: Negative for cough and shortness of breath.   Cardiovascular: Negative for chest pain or palpitations.  Gastrointestinal: Negative for  abdominal pain, no bowel changes.  Musculoskeletal: Negative for gait problem or joint swelling.  Skin: Negative for rash.  Neurological: Negative for dizziness or headache.  No other specific complaints in a complete review of systems (except as listed in HPI above).   Objective  Vitals:   09/03/22 0753  BP: 136/84  Pulse: 76  Resp: 16  SpO2: 98%  Weight: 185 lb (83.9 kg)  Height: 6\' 1"  (1.854 m)    Body mass index is 24.41 kg/m.  Physical Exam  Constitutional: Patient appears well-developed and well-nourished.  No distress.  HEENT: head atraumatic, normocephalic, pupils equal and reactive to light, neck supple Cardiovascular: Normal rate, regular rhythm and normal heart sounds.  No murmur heard. No BLE edema. Pulmonary/Chest: Effort normal and breath sounds normal. No respiratory distress. Abdominal: Soft.  There is no tenderness. Psychiatric: Patient has a normal mood and affect. behavior is normal. Judgment and thought content normal.    PHQ2/9:    09/03/2022    7:52 AM 07/03/2022    7:41 AM 03/05/2022    7:40 AM 09/02/2021    7:48 AM 06/30/2021    8:17 AM  Depression screen PHQ 2/9  Decreased Interest 0 0 0 0 0  Down, Depressed, Hopeless 0 0 0 0 0  PHQ - 2 Score 0 0 0 0 0  Altered sleeping 0 0 0  0  Tired, decreased energy 0 0 0  0  Change in appetite 0 0 0  0  Feeling bad or failure about yourself  0 0 0  0  Trouble concentrating 0 0 0  0  Moving slowly or fidgety/restless 0 0 0  0  Suicidal thoughts 0 0 0  0  PHQ-9 Score 0 0 0  0    phq 9 is negative   Fall Risk:    09/03/2022    7:52 AM 07/03/2022    7:40 AM 03/05/2022    7:40 AM 09/02/2021    7:47 AM 06/30/2021    8:16 AM  Fall Risk   Falls in the past year? 0 0 0 0 0  Number falls in past yr: 0 0 0  0  Injury with Fall? 0 0 0  0  Risk for fall due to : No Fall Risks No Fall Risks No Fall Risks No Fall Risks No Fall Risks  Follow up Falls prevention discussed Falls prevention discussed Falls  prevention discussed Falls prevention discussed Falls prevention discussed      Functional Status Survey: Is the patient deaf or have difficulty hearing?: No Does the patient have difficulty seeing, even when wearing glasses/contacts?: No Does the patient have difficulty concentrating, remembering, or making decisions?: No Does the patient have difficulty walking or climbing stairs?: No Does the patient have difficulty dressing or bathing?: No Does the patient have difficulty doing  errands alone such as visiting a doctor's office or shopping?: No    Assessment & Plan  1. Essential hypertension  - Nebivolol HCl 20 MG TABS; Take 1 tablet (20 mg total) by mouth daily.  Dispense: 90 tablet; Refill: 1  2. Atherosclerosis of abdominal aorta (HCC)  - rosuvastatin (CRESTOR) 20 MG tablet; Take 1 tablet (20 mg total) by mouth daily.  Dispense: 90 tablet; Refill: 1  3. Other male erectile dysfunction  - tadalafil (CIALIS) 20 MG tablet; Take 1 tablet (20 mg total) by mouth daily as needed for erectile dysfunction.  Dispense: 30 tablet; Refill: 1  4. Hypercholesterolemia  - rosuvastatin (CRESTOR) 20 MG tablet; Take 1 tablet (20 mg total) by mouth daily.  Dispense: 90 tablet; Refill: 1

## 2022-09-03 ENCOUNTER — Ambulatory Visit (INDEPENDENT_AMBULATORY_CARE_PROVIDER_SITE_OTHER): Payer: Medicare PPO | Admitting: Family Medicine

## 2022-09-03 ENCOUNTER — Other Ambulatory Visit: Payer: Self-pay | Admitting: Family Medicine

## 2022-09-03 ENCOUNTER — Encounter: Payer: Self-pay | Admitting: Family Medicine

## 2022-09-03 VITALS — BP 136/84 | HR 76 | Resp 16 | Ht 73.0 in | Wt 185.0 lb

## 2022-09-03 DIAGNOSIS — I10 Essential (primary) hypertension: Secondary | ICD-10-CM | POA: Diagnosis not present

## 2022-09-03 DIAGNOSIS — I700 Atherosclerosis of aorta: Secondary | ICD-10-CM | POA: Diagnosis not present

## 2022-09-03 DIAGNOSIS — E7800 Pure hypercholesterolemia, unspecified: Secondary | ICD-10-CM | POA: Diagnosis not present

## 2022-09-03 DIAGNOSIS — N528 Other male erectile dysfunction: Secondary | ICD-10-CM

## 2022-09-03 MED ORDER — NEBIVOLOL HCL 20 MG PO TABS: 1.0000 | ORAL_TABLET | Freq: Every day | ORAL | 1 refills | Status: DC

## 2022-09-03 MED ORDER — TADALAFIL 20 MG PO TABS: 20.0000 mg | ORAL_TABLET | Freq: Every day | ORAL | 1 refills | Status: DC | PRN

## 2022-09-03 MED ORDER — ROSUVASTATIN CALCIUM 20 MG PO TABS: 20.0000 mg | ORAL_TABLET | Freq: Every day | ORAL | 1 refills | Status: DC

## 2022-11-05 NOTE — Progress Notes (Signed)
Patient: Charles Valentine, Male    DOB: 1955-12-13, 67 y.o.   MRN: [id number]  Visit Date: 11/06/2022  Today's Provider: Ruel Favors, MD   Welcome to Medicare  Subjective:   Charles Valentine is a 67 y.o. male who presents today for his Subsequent Annual Wellness Visit.  Patient/Caregiver input:    HPI  IPSS Questionnaire (AUA-7): Over the past month.   1)  How often have you had a sensation of not emptying your bladder completely after you finish urinating?  0 - Not at all  2)  How often have you had to urinate again less than two hours after you finished urinating? 0 - Not at all  3)  How often have you found you stopped and started again several times when you urinated?  0 - Not at all  4) How difficult have you found it to postpone urination?  0 - Not at all  5) How often have you had a weak urinary stream?  0 - Not at all  6) How often have you had to push or strain to begin urination?  0 - Not at all  7) How many times did you most typically get up to urinate from the time you went to bed until the time you got up in the morning?  2 - 2 times  Total score:  0-7 mildly symptomatic   8-19 moderately symptomatic   20-35 severely symptomatic     Review of Systems  Constitutional: Negative for fever or weight change.  Respiratory: Negative for cough and shortness of breath.   Cardiovascular: Negative for chest pain or palpitations.  Gastrointestinal: Negative for abdominal pain, no bowel changes.  Musculoskeletal: Negative for gait problem or joint swelling.  Skin: Negative for rash.  Neurological: Negative for dizziness or headache.  No other specific complaints in a complete review of systems (except as listed in HPI above).  Past Medical History:  Diagnosis Date   AKI (acute kidney injury) (HCC) 07/17/2020   ED (erectile dysfunction)    History of colon polyps    Hyperlipidemia    Hypertension    Insomnia     Past Surgical History:  Procedure Laterality Date    COLONOSCOPY WITH PROPOFOL N/A 09/18/2020   Procedure: COLONOSCOPY WITH PROPOFOL;  Surgeon: Toney Reil, MD;  Location: [facility] ENDOSCOPY;  Service: Gastroenterology;  Laterality: N/A;   COLONOSCOPY WITH PROPOFOL N/A 10/01/2021   Procedure: COLONOSCOPY WITH PROPOFOL;  Surgeon: Toney Reil, MD;  Location: [facility] ENDOSCOPY;  Service: Gastroenterology;  Laterality: N/A;    Family History  Problem Relation Age of Onset   Heart failure Mother    Hypertension Mother    Diabetes Mother    Heart disease Mother 74       CABG   Heart attack Mother 56   Obesity Sister    Drug abuse Brother     Social History   Socioeconomic History   Marital status: Married    Spouse name: Gwenlyn Found   Number of children: 2   Years of education: Not on file   Highest education level: 12th grade  Occupational History   Occupation: Location manager    Comment: [employer]  Tobacco Use   Smoking status: Former    Current packs/day: 0.00    Average packs/day: 0.5 packs/day for 10.0 years (5.0 ttl pk-yrs)    Types: Cigarettes    Start date: 24    Quit date: 1989    Years since quitting:  35.5   Smokeless tobacco: Never  Vaping Use   Vaping status: Never Used  Substance and Sexual Activity   Alcohol use: Yes    Alcohol/week: 8.0 standard drinks of alcohol    Types: 6 Cans of beer, 2 Shots of liquor per week   Drug use: No   Sexual activity: Yes    Partners: Female    Birth control/protection: None  Other Topics Concern   Not on file  Social History Narrative   Married    He has two grown son's and 3 grandchildren   Retired October 2020    Social Determinants of Health   Financial Resource Strain: Low Risk  (11/06/2022)   Overall Financial Resource Strain (CARDIA)    Difficulty of Paying Living Expenses: Not hard at all  Food Insecurity: No Food Insecurity (11/06/2022)   Hunger Vital Sign    Worried About Running Out of Food in the Last Year: Never true    Ran Out of Food  in the Last Year: Never true  Transportation Needs: No Transportation Needs (11/06/2022)   PRAPARE - Administrator, Civil Service (Medical): No    Lack of Transportation (Non-Medical): No  Physical Activity: Insufficiently Active (11/06/2022)   Exercise Vital Sign    Days of Exercise per Week: 3 days    Minutes of Exercise per Session: 30 min  Stress: No Stress Concern Present (11/06/2022)   [employer] of Occupational Health - Occupational Stress Questionnaire    Feeling of Stress : Not at all  Social Connections: Moderately Isolated (11/06/2022)   Social Connection and Isolation Panel [NHANES]    Frequency of Communication with Friends and Family: Twice a week    Frequency of Social Gatherings with Friends and Family: More than three times a week    Attends Religious Services: Never    Database administrator or Organizations: No    Attends Banker Meetings: Never    Marital Status: Married  Catering manager Violence: Not At Risk (11/06/2022)   Humiliation, Afraid, Rape, and Kick questionnaire    Fear of Current or Ex-Partner: No    Emotionally Abused: No    Physically Abused: No    Sexually Abused: No    Outpatient Encounter Medications as of 11/06/2022  Medication Sig   Nebivolol HCl 20 MG TABS Take 1 tablet (20 mg total) by mouth daily.   rosuvastatin (CRESTOR) 20 MG tablet Take 1 tablet (20 mg total) by mouth daily.   tadalafil (CIALIS) 20 MG tablet Take 1 tablet (20 mg total) by mouth daily as needed for erectile dysfunction.   No facility-administered encounter medications on file as of 11/06/2022.    No Known Allergies  Care Team Updated in EHR: Yes  Last Vision Exam: 2021 Wears corrective lenses: No Last Dental Exam: 2020 Last Hearing Exam: 2021 Wears Hearing Aids: No  Functional Ability / Safety Screening 1.  Was the timed Get Up and Go test longer than 30 seconds?  yes 2.  Does the patient need help with the phone, transportation,  shopping,      preparing meals, housework, laundry, medications, or managing money?  no 3.  Does the patient's home have:  loose throw rugs in the hallway?   no      Grab bars in the bathroom? yes      Handrails on the stairs?   yes      Poor lighting?   no 4.  Has the patient noticed any  hearing difficulties?   no  Diet Recall and Exercise Regimen:   He eats out - usually dinner three to four times per week, he eats a healthy breakfast - blueberries, oatmeal and walnuts  Fall Risk:  See screening under Objective Information  Depression Screen:  See screening under Objective Information  Advanced Directives: A voluntary discussion about advance care planning including the explanation and discussion of advance directives was discussed with the patient. Explanation about the health care proxy and living will was reviewed.  During this discussion, the patient was able to identify a health care proxy as wife and plans/does not plan to fill out the paperwork required and will bring this to our office to keep on file. Does patient have a HCPOA?    yes If yes, name and contact information:  Does patient have a living will or MOST form?  yes  Cancer Screenings: Skin: discussed atypical lesions  Lung: Low Dose CT Chest recommended if Age 56-80 years, 30 pack-year currently smoking OR have quit w/in 15years. Patient does not qualify. Lifestyle risk factor issued reviewed: Diet, exercise, weight management, advised patient smoking is not healthy, nutrition/diet.   Prostate: discussed USPTF  Colon: Colonoscopy 10/01/21  Additional Screenings: Hepatitis B/HIV/Syphillis: 07/17/20 Hepatitis C Screening: 02/28/13 Intimate Partner Violence: Negative AAA Screen: Men age 54 to 75 years if ever smoked recommended to get a one time AAA ultrasound screening exam. Patient does not qualify.  Hearing Screening   500Hz  1000Hz  2000Hz  4000Hz   Right ear Pass Pass Pass Pass  Left ear Pass Pass Pass Pass    Vision Screening   Right eye Left eye Both eyes  Without correction 20/25 20/25 20/20   With correction       Objective:   Vitals: BP 132/84   Pulse 60   Resp 16   Ht 6' (1.829 m)   Wt 183 lb (83 kg)   SpO2 98%   BMI 24.82 kg/m  Body mass index is 24.82 kg/m.  Lab Results  Component Value Date   CHOL 119 07/03/2022   CHOL 161 06/30/2021   CHOL 128 08/28/2020   Lab Results  Component Value Date   HDL 47 07/03/2022   HDL 50 06/30/2021   HDL 43 08/28/2020   Lab Results  Component Value Date   LDLCALC 57 07/03/2022   LDLCALC 92 06/30/2021   LDLCALC 70 08/28/2020   Lab Results  Component Value Date   TRIG 73 07/03/2022   TRIG 92 06/30/2021   TRIG 70 08/28/2020   Lab Results  Component Value Date   CHOLHDL 2.5 07/03/2022   CHOLHDL 3.2 06/30/2021   CHOLHDL 3.0 08/28/2020   No results found for: "LDLDIRECT"  No results found.  Physical Exam Constitutional: Patient appears well-developed and well-nourished. Obese  No distress.  HEENT: head atraumatic, normocephalic, pupils equal and reactive to light, normal TM, neck supple, throat within normal limits Cardiovascular: Normal rate, regular rhythm and normal heart sounds.  No murmur heard. No BLE edema. Pulmonary/Chest: Effort normal and breath sounds normal. No respiratory distress. Abdominal: Soft.  There is no tenderness. Psychiatric: Patient has a normal mood and affect. behavior is normal. Judgment and thought content normal.   Cognitive Testing - 6-CIT  Correct? Score   What year is it? yes 0 Yes = 0    No = 4  What month is it? yes 0 Yes = 0    No = 3  Remember:     Floyde Parkins, [address]. [place],  [place]     What time is it? yes 0 Yes = 0    No = 3  Count backwards from 20 to 1 yes 0 Correct = 0    1 error = 2   More than 1 error = 4  Say the months of the year in reverse. yes 0 Correct = 0    1 error = 2   More than 1 error = 4  What address did I ask you to remember? no 6 Correct = 0  1 error = 2     2 error = 4    3 error = 6    4 error = 8    All wrong = 10       TOTAL SCORE  6/28   Interpretation:  Normal  Normal (0-7) Abnormal (8-28)   Fall Risk:    11/06/2022    8:07 AM 09/03/2022    7:52 AM 07/03/2022    7:40 AM 03/05/2022    7:40 AM 09/02/2021    7:47 AM  Fall Risk   Falls in the past year? 0 0 0 0 0  Number falls in past yr: 0 0 0 0   Injury with Fall? 0 0 0 0   Risk for fall due to : No Fall Risks No Fall Risks No Fall Risks No Fall Risks No Fall Risks  Follow up Falls prevention discussed Falls prevention discussed Falls prevention discussed Falls prevention discussed Falls prevention discussed    Depression Screen    11/06/2022    8:08 AM 09/03/2022    7:52 AM 07/03/2022    7:41 AM 03/05/2022    7:40 AM 09/02/2021    7:48 AM  Depression screen PHQ 2/9  Decreased Interest 0 0 0 0 0  Down, Depressed, Hopeless 0 0 0 0 0  PHQ - 2 Score 0 0 0 0 0  Altered sleeping 0 0 0 0   Tired, decreased energy 0 0 0 0   Change in appetite 0 0 0 0   Feeling bad or failure about yourself  0 0 0 0   Trouble concentrating 0 0 0 0   Moving slowly or fidgety/restless 0 0 0 0   Suicidal thoughts 0 0 0 0   PHQ-9 Score 0 0 0 0     No results found for this or any previous visit (from the past 2160 hour(s)).   Assessment & Plan:     1. Welcome to Medicare preventive visit   - EKG 12-Lead  2. Need for pneumococcal vaccine  - Pneumococcal conjugate vaccine 20-valent (Prevnar 20)  3. Essential hypertension  EKG  4. Bradycardia by electrocardiogram  48 on EKG, sinus bradycardia, on his apple watch he states usually in the 60's but it goes to the upper 40's no symptoms. Asked him to notify me if it goes below 45 and or if symptoms    Exercise Activities and Dietary recommendations  Eat out less often, cut down on fried food   Discussed health benefits of physical activity, and encouraged him to engage in regular exercise appropriate for his age and condition.    Immunization History  Administered Date(s) Administered   Influenza,inj,Quad PF,6+ Mos 12/10/2017, 01/31/2019, 01/24/2020, 03/04/2021, 03/05/2022   Influenza-Unspecified 02/14/2016, 02/17/2017   PFIZER(Purple Top)SARS-COV-2 Vaccination 07/24/2019, 08/23/2019   Pfizer Covid-19 Vaccine Bivalent Booster 61yrs & up 02/10/2021   Tdap 09/11/2010, 07/25/2020   Zoster Recombinant(Shingrix) 12/10/2017, 07/25/2020    Health Maintenance  Topic  Date Due   Pneumonia Vaccine 69+ Years old (1 of 1 - PCV) Never done   COVID-19 Vaccine (4 - 2023-24 season) 11/21/2022 (Originally 12/19/2021)   INFLUENZA VACCINE  11/19/2022   Medicare Annual Wellness (AWV)  11/06/2023   DTaP/Tdap/Td (3 - Td or Tdap) 07/26/2030   Colonoscopy  10/02/2031   Hepatitis C Screening  Completed   HIV Screening  Completed   Zoster Vaccines- Shingrix  Completed   HPV VACCINES  Aged Out     No orders of the defined types were placed in this encounter.   Current Outpatient Medications:    Nebivolol HCl 20 MG TABS, Take 1 tablet (20 mg total) by mouth daily., Disp: 90 tablet, Rfl: 1   rosuvastatin (CRESTOR) 20 MG tablet, Take 1 tablet (20 mg total) by mouth daily., Disp: 90 tablet, Rfl: 1   tadalafil (CIALIS) 20 MG tablet, Take 1 tablet (20 mg total) by mouth daily as needed for erectile dysfunction., Disp: 30 tablet, Rfl: 1 There are no discontinued medications.  I have personally reviewed and addressed the Medicare Annual Wellness health risk assessment questionnaire and have noted the following in the patient's chart:  A.         Medical and social history & family history B.         Use of alcohol, tobacco or illicit drugs  C.         Current medications and supplements D.         Functional and Cognitive ability and status E.         Nutritional status F.         Physical activity G.        Advance directives H.         List of other physicians I.          Hospitalizations, surgeries, and ER visits in previous 12  months J.         Vitals K.         Screenings such as hearing and vision if needed, cognitive and depression L.         Referrals and appointments N/A  In addition, I have reviewed and discussed with patient certain preventive protocols, quality metrics, and best practice recommendations. A written personalized care plan for preventive services as well as general preventive health recommendations were provided to patient.   See attached scanned questionnaire for additional information.

## 2022-11-06 ENCOUNTER — Ambulatory Visit (INDEPENDENT_AMBULATORY_CARE_PROVIDER_SITE_OTHER): Payer: Medicare PPO | Admitting: Family Medicine

## 2022-11-06 ENCOUNTER — Encounter: Payer: Self-pay | Admitting: Family Medicine

## 2022-11-06 VITALS — BP 132/84 | HR 60 | Resp 16 | Ht 72.0 in | Wt 183.0 lb

## 2022-11-06 DIAGNOSIS — R001 Bradycardia, unspecified: Secondary | ICD-10-CM | POA: Insufficient documentation

## 2022-11-06 DIAGNOSIS — Z0000 Encounter for general adult medical examination without abnormal findings: Secondary | ICD-10-CM | POA: Diagnosis not present

## 2022-11-06 DIAGNOSIS — I10 Essential (primary) hypertension: Secondary | ICD-10-CM | POA: Diagnosis not present

## 2022-11-06 DIAGNOSIS — Z23 Encounter for immunization: Secondary | ICD-10-CM

## 2023-03-05 NOTE — Progress Notes (Unsigned)
Name: Charles Valentine   MRN: [id number]    DOB: September 23, 1955   Date:03/08/2023       Progress Note  Subjective  Chief Complaint  Follow Up  HPI  HTN: diagnosed in 2018, he was  Norvasc 10 mg also tried Exforge but switched to Bystolic because of tachycardia, tolerating 20 mg daily and bp has been low 130's/80's discussed adding another medication to get BP lower but he would like to hold off for now. He denies chest pain, dizziness or  SOB.   Echo 08/2016 :  - Left ventricle: The cavity size was normal. Wall thickness was   normal. Systolic function was normal. The estimated ejection   fraction was in the range of 60% to 65%. Wall motion was normal;   there were no regional wall motion abnormalities. Left   ventricular diastolic function parameters were normal.   Hyperlipidemia/Atherosclerosis aorta : plaques found on CT done 06/2020  he is now taking Rosuvastatin daily and denies side effects . Last LDL was 57 March 2024    ED: he is taking Cialis occasionally, doing well on medication, no side effects .   Pre diabetes: last A1C was 5.8 % He denies polyphagia, polydipsia or polyuria. He is cutting down on sweets, he has been active outdoors also   Renal cyst: during imaging for diarrhea he was found to have a renal cyst, US showed possible complex cyst, but MRI was benign - unchanged   MRI abdomen done 08/09/2020:   2.3 x 1.6 cm cyst with a single thin septation in the posterior interpolar right kidney (series 19/image 22). No solid component or enhancement following contrast administration. 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  Based on UptoDate, considered benign and no further work up required. Patient is aware   Patient Active Problem List   Diagnosis Date Noted   Bradycardia by electrocardiogram 11/06/2022   History of colonic polyps    Cyst of right kidney 09/02/2021   Atherosclerosis of abdominal aorta (HCC) 07/25/2020   Diverticulosis 07/25/2020    Sinus tachycardia 09/02/2016   Essential hypertension 09/02/2016   Eczema 06/08/2016   Hypercholesterolemia 04/08/2016   ED (erectile dysfunction)     Past Surgical History:  Procedure Laterality Date   COLONOSCOPY WITH PROPOFOL N/A 09/18/2020   Procedure: COLONOSCOPY WITH PROPOFOL;  Surgeon: Toney Reil, MD;  Location: [facility] ENDOSCOPY;  Service: Gastroenterology;  Laterality: N/A;   COLONOSCOPY WITH PROPOFOL N/A 10/01/2021   Procedure: COLONOSCOPY WITH PROPOFOL;  Surgeon: Toney Reil, MD;  Location: [facility] ENDOSCOPY;  Service: Gastroenterology;  Laterality: N/A;    Family History  Problem Relation Age of Onset   Heart failure Mother    Hypertension Mother    Diabetes Mother    Heart disease Mother 11       CABG   Heart attack Mother 67   Obesity Sister    Drug abuse Brother     Social History   Tobacco Use   Smoking status: Former    Current packs/day: 0.00    Average packs/day: 0.5 packs/day for 10.0 years (5.0 ttl pk-yrs)    Types: Cigarettes    Start date: 51    Quit date: 1989    Years since quitting: 35.9   Smokeless tobacco: Never  Substance Use Topics   Alcohol use: Yes    Alcohol/week: 8.0 standard drinks of alcohol    Types: 6 Cans of beer, 2 Shots of liquor per week  Current Outpatient Medications:    Nebivolol HCl 20 MG TABS, Take 1 tablet (20 mg total) by mouth daily., Disp: 90 tablet, Rfl: 1   rosuvastatin (CRESTOR) 20 MG tablet, Take 1 tablet (20 mg total) by mouth daily., Disp: 90 tablet, Rfl: 1   tadalafil (CIALIS) 20 MG tablet, Take 1 tablet (20 mg total) by mouth daily as needed for erectile dysfunction., Disp: 30 tablet, Rfl: 1  No Known Allergies  I personally reviewed active problem list, medication list, allergies, family history, social history, health maintenance with the patient/caregiver today.   ROS  Ten systems reviewed and is negative except as mentioned in HPI    Objective  Vitals:   03/08/23 0743  BP:  132/82  Pulse: 66  Resp: 16  SpO2: 98%  Weight: 187 lb (84.8 kg)  Height: 6' (1.829 m)    Body mass index is 25.36 kg/m.  Physical Exam  Constitutional: Patient appears well-developed and well-nourished.  No distress.  HEENT: head atraumatic, normocephalic, pupils equal and reactive to light, neck supple, throat within normal limits Cardiovascular: Normal rate, regular rhythm and normal heart sounds.  No murmur heard. No BLE edema. Pulmonary/Chest: Effort normal and breath sounds normal. No respiratory distress. Abdominal: Soft.  There is no tenderness. Psychiatric: Patient has a normal mood and affect. behavior is normal. Judgment and thought content normal.   No results found for this or any previous visit (from the past 2160 hour(s)).   PHQ2/9:    03/08/2023    7:42 AM 11/06/2022    8:08 AM 09/03/2022    7:52 AM 07/03/2022    7:41 AM 03/05/2022    7:40 AM  Depression screen PHQ 2/9  Decreased Interest 0 0 0 0 0  Down, Depressed, Hopeless 0 0 0 0 0  PHQ - 2 Score 0 0 0 0 0  Altered sleeping 0 0 0 0 0  Tired, decreased energy 0 0 0 0 0  Change in appetite 0 0 0 0 0  Feeling bad or failure about yourself  0 0 0 0 0  Trouble concentrating 0 0 0 0 0  Moving slowly or fidgety/restless 0 0 0 0 0  Suicidal thoughts 0 0 0 0 0  PHQ-9 Score 0 0 0 0 0    phq 9 is negative   Fall Risk:    03/08/2023    7:42 AM 11/06/2022    8:07 AM 09/03/2022    7:52 AM 07/03/2022    7:40 AM 03/05/2022    7:40 AM  Fall Risk   Falls in the past year? 0 0 0 0 0  Number falls in past yr: 0 0 0 0 0  Injury with Fall? 0 0 0 0 0  Risk for fall due to : No Fall Risks No Fall Risks No Fall Risks No Fall Risks No Fall Risks  Follow up Falls prevention discussed Falls prevention discussed Falls prevention discussed Falls prevention discussed Falls prevention discussed      Functional Status Survey: Is the patient deaf or have difficulty hearing?: No Does the patient have difficulty seeing,  even when wearing glasses/contacts?: No Does the patient have difficulty concentrating, remembering, or making decisions?: No Does the patient have difficulty walking or climbing stairs?: No Does the patient have difficulty dressing or bathing?: No Does the patient have difficulty doing errands alone such as visiting a doctor's office or shopping?: No    Assessment & Plan  1. Atherosclerosis of abdominal aorta (HCC)  -  rosuvastatin (CRESTOR) 20 MG tablet; Take 1 tablet (20 mg total) by mouth daily.  Dispense: 90 tablet; Refill: 1  2. Hypercholesterolemia  - rosuvastatin (CRESTOR) 20 MG tablet; Take 1 tablet (20 mg total) by mouth daily.  Dispense: 90 tablet; Refill: 1  3. Other male erectile dysfunction  - tadalafil (CIALIS) 20 MG tablet; Take 1 tablet (20 mg total) by mouth daily as needed for erectile dysfunction.  Dispense: 30 tablet; Refill: 1  4. Need for immunization against influenza  - Flu Vaccine Trivalent High Dose (Fluad)  5. Essential hypertension  - Nebivolol HCl 20 MG TABS; Take 1 tablet (20 mg total) by mouth daily.  Dispense: 90 tablet; Refill: 1

## 2023-03-08 ENCOUNTER — Encounter: Payer: Self-pay | Admitting: Family Medicine

## 2023-03-08 ENCOUNTER — Ambulatory Visit (INDEPENDENT_AMBULATORY_CARE_PROVIDER_SITE_OTHER): Payer: Medicare PPO | Admitting: Family Medicine

## 2023-03-08 VITALS — BP 132/82 | HR 66 | Resp 16 | Ht 72.0 in | Wt 187.0 lb

## 2023-03-08 DIAGNOSIS — I700 Atherosclerosis of aorta: Secondary | ICD-10-CM | POA: Diagnosis not present

## 2023-03-08 DIAGNOSIS — Z23 Encounter for immunization: Secondary | ICD-10-CM

## 2023-03-08 DIAGNOSIS — E7800 Pure hypercholesterolemia, unspecified: Secondary | ICD-10-CM

## 2023-03-08 DIAGNOSIS — I10 Essential (primary) hypertension: Secondary | ICD-10-CM | POA: Diagnosis not present

## 2023-03-08 DIAGNOSIS — N528 Other male erectile dysfunction: Secondary | ICD-10-CM

## 2023-03-08 MED ORDER — ROSUVASTATIN CALCIUM 20 MG PO TABS: 20.0000 mg | ORAL_TABLET | Freq: Every day | ORAL | 1 refills | Status: DC

## 2023-03-08 MED ORDER — NEBIVOLOL HCL 20 MG PO TABS: 1.0000 | ORAL_TABLET | Freq: Every day | ORAL | 1 refills | Status: DC

## 2023-03-08 MED ORDER — TADALAFIL 20 MG PO TABS: 20.0000 mg | ORAL_TABLET | Freq: Every day | ORAL | 1 refills | Status: DC | PRN

## 2023-03-10 ENCOUNTER — Other Ambulatory Visit: Payer: Self-pay | Admitting: Family Medicine

## 2023-03-10 DIAGNOSIS — I10 Essential (primary) hypertension: Secondary | ICD-10-CM

## 2023-03-10 DIAGNOSIS — I700 Atherosclerosis of aorta: Secondary | ICD-10-CM

## 2023-03-10 DIAGNOSIS — E7800 Pure hypercholesterolemia, unspecified: Secondary | ICD-10-CM

## 2023-03-10 MED ORDER — NEBIVOLOL HCL 20 MG PO TABS: 1.0000 | ORAL_TABLET | Freq: Every day | ORAL | 1 refills | Status: DC

## 2023-03-10 MED ORDER — ROSUVASTATIN CALCIUM 20 MG PO TABS: 20.0000 mg | ORAL_TABLET | Freq: Every day | ORAL | 1 refills | Status: DC

## 2023-03-10 NOTE — Telephone Encounter (Signed)
Medication Refill -  Most Recent Primary Care Visit:  Provider: Alba Cory  Department: [facility]  Visit Type: OFFICE VISIT  Date: 03/08/2023  Medication: rosuvastatin (CRESTOR) 20 MG tablet , Nebivolol HCl 20 MG TABS   Pt requested medication to be transferred to the pharmacy below.    Has the patient contacted their pharmacy? Yes  (Agent: If yes, when and what did the pharmacy advise?)  Is this the correct pharmacy for this prescription? Yes If no, delete pharmacy and type the correct one.  This is the patient's preferred pharmacy:    [facility] [address] [facility] - [place], [place] - [address] [facility] [address] [facility] [address] [place] [address] Phone: [phone] Fax: [phone]   Has the prescription been filled recently? Yes  Is the patient out of the medication? Yes  Has the patient been seen for an appointment in the last year OR does the patient have an upcoming appointment? Yes  Can we respond through MyChart? Yes  Agent: Please be advised that Rx refills may take up to 3 business days. We ask that you follow-up with your pharmacy.

## 2023-03-10 NOTE — Telephone Encounter (Signed)
Requested Prescriptions  Pending Prescriptions Disp Refills   rosuvastatin (CRESTOR) 20 MG tablet 90 tablet 1    Sig: Take 1 tablet (20 mg total) by mouth daily.     Cardiovascular:  Antilipid - Statins 2 Failed - 03/10/2023  9:38 AM      Failed - Lipid Panel in normal range within the last 12 months    Cholesterol, Total  Date Value Ref Range Status  03/13/2015 203 (H) 100 - 199 mg/dL Final   Cholesterol  Date Value Ref Range Status  07/03/2022 119 <200 mg/dL Final   LDL Cholesterol (Calc)  Date Value Ref Range Status  07/03/2022 57 mg/dL (calc) Final    Comment:    Reference range: <100 . Desirable range <100 mg/dL for primary prevention;   <70 mg/dL for patients with CHD or diabetic patients  with > or = 2 CHD risk factors. Marland Kitchen LDL-C is now calculated using the Martin-Hopkins  calculation, which is a validated novel method providing  better accuracy than the Friedewald equation in the  estimation of LDL-C.  Horald Pollen et al. Lenox Ahr. 6045;409(81): 2061-2068  (http://education.[facility].com/faq/FAQ164)    HDL  Date Value Ref Range Status  07/03/2022 47 > OR = 40 mg/dL Final  19/14/7829 51 >56 mg/dL Final   Triglycerides  Date Value Ref Range Status  07/03/2022 73 <150 mg/dL Final         Passed - Cr in normal range and within 360 days    Creat  Date Value Ref Range Status  07/03/2022 1.08 0.70 - 1.35 mg/dL Final   Creatinine, Urine  Date Value Ref Range Status  07/25/2019 84 20 - 320 mg/dL Final         Passed - Patient is not pregnant      Passed - Valid encounter within last 12 months    Recent Outpatient Visits           2 days ago Atherosclerosis of abdominal aorta [facility])   [facility] Alba Cory, MD   4 months ago [facility] [employer] preventive visit   [facility] Alba Cory, MD   6 months ago Essential hypertension   [facility] Alba Cory, MD   8  months ago Well adult exam   [facility] Alba Cory, MD   1 year ago Atherosclerosis of abdominal aorta [facility])   [facility] Alba Cory, MD       Future Appointments             In 6 months Alba Cory, MD [facility]             Nebivolol HCl 20 MG TABS 90 tablet 1    Sig: Take 1 tablet (20 mg total) by mouth daily.     Cardiovascular: Beta Blockers 3 Passed - 03/10/2023  9:38 AM      Passed - Cr in normal range and within 360 days    Creat  Date Value Ref Range Status  07/03/2022 1.08 0.70 - 1.35 mg/dL Final   Creatinine, Urine  Date Value Ref Range Status  07/25/2019 84 20 - 320 mg/dL Final         Passed - AST in normal range and within 360 days    AST  Date Value Ref Range Status  07/03/2022 19 10 - 35 U/L Final         Passed -  ALT in normal range and within 360 days    ALT  Date Value Ref Range Status  07/03/2022 25 9 - 46 U/L Final         Passed - Last BP in normal range    BP Readings from Last 1 Encounters:  03/08/23 132/82         Passed - Last Heart Rate in normal range    Pulse Readings from Last 1 Encounters:  03/08/23 66         Passed - Valid encounter within last 6 months    Recent Outpatient Visits           2 days ago Atherosclerosis of abdominal aorta [facility])   [facility] Alba Cory, MD   4 months ago [facility] to [employer] preventive visit   [facility] Alba Cory, MD   6 months ago Essential hypertension   [facility] Alba Cory, MD   8 months ago Well adult exam   [facility] Alba Cory, MD   1 year ago Atherosclerosis of abdominal aorta [facility])   [facility] Alba Cory, MD       Future Appointments             In 6 months Carlynn Purl, Danna Hefty, MD [facility]

## 2023-06-05 ENCOUNTER — Other Ambulatory Visit: Payer: Self-pay | Admitting: Family Medicine

## 2023-06-05 DIAGNOSIS — I10 Essential (primary) hypertension: Secondary | ICD-10-CM

## 2023-09-06 ENCOUNTER — Telehealth: Payer: Self-pay | Admitting: Family Medicine

## 2023-09-06 ENCOUNTER — Ambulatory Visit (INDEPENDENT_AMBULATORY_CARE_PROVIDER_SITE_OTHER): Payer: Self-pay | Admitting: Family Medicine

## 2023-09-06 ENCOUNTER — Encounter: Payer: Self-pay | Admitting: Family Medicine

## 2023-09-06 VITALS — BP 128/80 | HR 58 | Temp 97.9°F | Resp 16 | Ht 72.0 in | Wt 181.8 lb

## 2023-09-06 DIAGNOSIS — I10 Essential (primary) hypertension: Secondary | ICD-10-CM | POA: Diagnosis not present

## 2023-09-06 DIAGNOSIS — R739 Hyperglycemia, unspecified: Secondary | ICD-10-CM

## 2023-09-06 DIAGNOSIS — E7800 Pure hypercholesterolemia, unspecified: Secondary | ICD-10-CM | POA: Diagnosis not present

## 2023-09-06 DIAGNOSIS — N528 Other male erectile dysfunction: Secondary | ICD-10-CM | POA: Diagnosis not present

## 2023-09-06 DIAGNOSIS — I700 Atherosclerosis of aorta: Secondary | ICD-10-CM | POA: Diagnosis not present

## 2023-09-06 DIAGNOSIS — N281 Cyst of kidney, acquired: Secondary | ICD-10-CM

## 2023-09-06 LAB — LIPID PANEL
Cholesterol: 129 mg/dL (ref <200)
HDL: 49 mg/dL (ref >=40)
LDL Cholesterol (Calc): 64 mg/dL
Non-HDL Cholesterol (Calc): 80 mg/dL (ref <130)
Total CHOL/HDL Ratio: 2.6 (calc) (ref <5.0)
Triglycerides: 79 mg/dL (ref <150)

## 2023-09-06 LAB — COMPREHENSIVE METABOLIC PANEL WITH GFR
AG Ratio: 1.4 (calc) (ref 1.0–2.5)
ALT: 21 U/L (ref 9–46)
AST: 20 U/L (ref 10–35)
Albumin: 4.7 g/dL (ref 3.6–5.1)
Alkaline phosphatase (APISO): 63 U/L (ref 35–144)
BUN: 17 mg/dL (ref 7–25)
CO2: 25 mmol/L (ref 20–32)
Calcium: 9.8 mg/dL (ref 8.6–10.3)
Chloride: 104 mmol/L (ref 98–110)
Creat: 1.18 mg/dL (ref 0.70–1.35)
Globulin: 3.4 g/dL (ref 1.9–3.7)
Glucose, Bld: 95 mg/dL (ref 65–99)
Potassium: 5.2 mmol/L (ref 3.5–5.3)
Sodium: 137 mmol/L (ref 135–146)
Total Bilirubin: 0.4 mg/dL (ref 0.2–1.2)
Total Protein: 8.1 g/dL (ref 6.1–8.1)
eGFR: 68 mL/min/{1.73_m2} (ref >=60)

## 2023-09-06 LAB — CBC WITH DIFFERENTIAL/PLATELET
Absolute Lymphocytes: 1427 {cells}/uL (ref 850–3900)
Absolute Monocytes: 589 {cells}/uL (ref 200–950)
Basophils Absolute: 28 {cells}/uL (ref 0–200)
Basophils Relative: 0.4 %
Eosinophils Absolute: 99 {cells}/uL (ref 15–500)
Eosinophils Relative: 1.4 %
HCT: 43.9 % (ref 38.5–50.0)
Hemoglobin: 13.7 g/dL (ref 13.2–17.1)
MCH: 28.5 pg (ref 27.0–33.0)
MCHC: 31.2 g/dL — ABNORMAL LOW (ref 32.0–36.0)
MCV: 91.3 fL (ref 80.0–100.0)
MPV: 10.3 fL (ref 7.5–12.5)
Monocytes Relative: 8.3 %
Neutro Abs: 4956 {cells}/uL (ref 1500–7800)
Neutrophils Relative %: 69.8 %
Platelets: 226 10*3/uL (ref 140–400)
RBC: 4.81 10*6/uL (ref 4.20–5.80)
RDW: 12.7 % (ref 11.0–15.0)
Total Lymphocyte: 20.1 %
WBC: 7.1 10*3/uL (ref 3.8–10.8)

## 2023-09-06 LAB — HEMOGLOBIN A1C
Hgb A1c MFr Bld: 5.8 % — ABNORMAL HIGH (ref <5.7)
Mean Plasma Glucose: 120 mg/dL
eAG (mmol/L): 6.6 mmol/L

## 2023-09-06 MED ORDER — ROSUVASTATIN CALCIUM 20 MG PO TABS: 20.0000 mg | ORAL_TABLET | Freq: Every day | ORAL | 1 refills | Status: DC

## 2023-09-06 MED ORDER — TADALAFIL 20 MG PO TABS: 20.0000 mg | ORAL_TABLET | Freq: Every day | ORAL | 1 refills | Status: DC | PRN

## 2023-09-06 MED ORDER — NEBIVOLOL HCL 20 MG PO TABS: 1.0000 | ORAL_TABLET | Freq: Every day | ORAL | 1 refills | Status: DC

## 2023-09-06 NOTE — Telephone Encounter (Signed)
 Please call to schedule awv

## 2023-09-06 NOTE — Progress Notes (Signed)
 Name: Charles Valentine   MRN: [id number]    DOB: 07/15/1955   Date:09/06/2023       Progress Note  Subjective  Chief Complaint  Chief Complaint  Patient presents with   Medical Management of Chronic Issues   Discussed the use of AI scribe software for clinical note transcription with the patient, who gave verbal consent to proceed.  History of Present Illness Charles Valentine is a 68 year old male who presents for a six-month follow-up visit.  He is currently on Bystolic  20 mg for hypertension, rosuvastatin  for hyperlipidemia, and Cialis  20 mg as needed for erectile dysfunction, all of which he tolerates well without any side effects.  Regarding his hypertension, he experiences no chest pain, palpitations, or shortness of breath, and does not have dizziness upon standing. His blood pressure remains stable and within normal range.  For hyperlipidemia, he takes rosuvastatin  and denies any side effects such as muscle aches. He has atherosclerosis of the aorta and occasionally takes aspirin .  He has a cyst on his right kidney but denies any blood in the urine or flank pain.  His A1c has been trending up, with a reading of 5.8% last year. He has been actively reducing carbohydrate intake, particularly bread, contributing to his weight loss from 187 lbs to 181.8 lbs.  He enjoys physical activities and plans to visit [facility] for a short vacation. 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    Patient Active Problem List   Diagnosis Date Noted   Bradycardia by electrocardiogram 11/06/2022   History of colonic polyps    Cyst of right kidney 09/02/2021   Atherosclerosis of abdominal aorta (HCC) 07/25/2020   Diverticulosis 07/25/2020   Sinus tachycardia 09/02/2016   Essential hypertension 09/02/2016   Eczema 06/08/2016   Hypercholesterolemia 04/08/2016   ED (erectile dysfunction)     Past Surgical History:  Procedure Laterality Date   COLONOSCOPY WITH  PROPOFOL  N/A 09/18/2020   Procedure: COLONOSCOPY WITH PROPOFOL ;  Surgeon: Selena Daily, MD;  Location: [facility] ENDOSCOPY;  Service: Gastroenterology;  Laterality: N/A;   COLONOSCOPY WITH PROPOFOL  N/A 10/01/2021   Procedure: COLONOSCOPY WITH PROPOFOL ;  Surgeon: Selena Daily, MD;  Location: [facility] ENDOSCOPY;  Service: Gastroenterology;  Laterality: N/A;    Family History  Problem Relation Age of Onset   Heart failure Mother    Hypertension Mother    Diabetes Mother    Heart disease Mother 19       CABG   Heart attack Mother 21   Obesity Sister    Drug abuse Brother     Social History   Tobacco Use   Smoking status: Former    Current packs/day: 0.00    Average packs/day: 0.5 packs/day for 10.0 years (5.0 ttl pk-yrs)    Types: Cigarettes    Start date: 71    Quit date: 1989    Years since quitting: 36.4   Smokeless tobacco: Never  Substance Use Topics   Alcohol use: Yes    Alcohol/week: 8.0 standard drinks of alcohol    Types: 6 Cans of beer, 2 Shots of liquor per week     Current Outpatient Medications:    Nebivolol  HCl 20 MG TABS, Take 1 tablet (20 mg total) by mouth daily., Disp: 90 tablet, Rfl: 1   rosuvastatin  (CRESTOR ) 20 MG tablet, Take 1 tablet (20 mg total) by mouth daily., Disp: 90 tablet, Rfl: 1   tadalafil  (CIALIS ) 20 MG tablet, Take 1 tablet (20  mg total) by mouth daily as needed for erectile dysfunction., Disp: 30 tablet, Rfl: 1  No Known Allergies  I personally reviewed active problem list, medication list, allergies, family history with the patient/caregiver today.   ROS  Constitutional: Negative for fever or weight change.  Respiratory: Negative for cough and shortness of breath.   Cardiovascular: Negative for chest pain or palpitations.  Gastrointestinal: Negative for abdominal pain, no bowel changes.  Musculoskeletal: Negative for gait problem or joint swelling.  Skin: Negative for rash.  Neurological: Negative for dizziness or headache.   No other specific complaints in a complete review of systems (except as listed in HPI above).   Objective Physical Exam VITALS: P- 58 MEASUREMENTS: Weight- 181.8. CONSTITUTIONAL: Patient appears well-developed and well-nourished. No distress. HEENT: Head atraumatic, normocephalic, neck supple. CARDIOVASCULAR: Normal rate, regular rhythm and normal heart sounds. No murmur heard. No BLE edema. PULMONARY: Effort normal and breath sounds normal. Lungs clear to auscultation. No respiratory distress. MUSCULOSKELETAL: Normal gait. Without gross motor or sensory deficit. PSYCHIATRIC: Patient has a normal mood and affect. Behavior is normal. Judgment and thought content normal.  Vitals:   09/06/23 0815  BP: 128/80  Pulse: (!) 58  Resp: 16  Temp: 97.9 F (36.6 C)  TempSrc: Oral  SpO2: 100%  Weight: 181 lb 12.8 oz (82.5 kg)  Height: 6' (1.829 m)    Body mass index is 24.66 kg/m.   PHQ2/9:    09/06/2023    8:08 AM 03/08/2023    7:42 AM 11/06/2022    8:08 AM 09/03/2022    7:52 AM 07/03/2022    7:41 AM  Depression screen PHQ 2/9  Decreased Interest 0 0 0 0 0  Down, Depressed, Hopeless 0 0 0 0 0  PHQ - 2 Score 0 0 0 0 0  Altered sleeping  0 0 0 0  Tired, decreased energy  0 0 0 0  Change in appetite  0 0 0 0  Feeling bad or failure about yourself   0 0 0 0  Trouble concentrating  0 0 0 0  Moving slowly or fidgety/restless  0 0 0 0  Suicidal thoughts  0 0 0 0  PHQ-9 Score  0 0 0 0    phq 9 is negative  Fall Risk:    09/06/2023    8:08 AM 03/08/2023    7:42 AM 11/06/2022    8:07 AM 09/03/2022    7:52 AM 07/03/2022    7:40 AM  Fall Risk   Falls in the past year? 0 0 0 0 0  Number falls in past yr: 0 0 0 0 0  Injury with Fall? 0 0 0 0 0  Risk for fall due to : No Fall Risks No Fall Risks No Fall Risks No Fall Risks No Fall Risks  Follow up Falls prevention discussed;Education provided;Falls evaluation completed Falls prevention discussed Falls prevention discussed Falls  prevention discussed Falls prevention discussed      Assessment & Plan Hypertension Well-controlled with Bystolic  20 mg daily. No side effects reported. Blood pressure normal. - Continue Bystolic  20 mg daily.  Chronic bradycardia Heart rate 58 bpm, likely due to Bystolic . Asymptomatic. - Reassess if symptoms develop.  Hyperlipidemia Managed with rosuvastatin . No side effects. Adherence confirmed. - Continue rosuvastatin  as prescribed. - Monitor lipid levels during routine blood work.  Atherosclerosis of aorta Managed with lifestyle modifications and intermittent aspirin . No acute symptoms. - Continue lifestyle modifications. - Consider aspirin  use as needed.  Prediabetes A1c increased  from 5.7% to 5.8%. Managed with dietary changes and weight loss. Weight decreased from 187 lbs to 181.8 lbs. - Continue dietary modifications to reduce carbohydrate intake. - Monitor A1c levels with routine blood work.  Renal cyst Asymptomatic. No hematuria or flank pain. - Monitor for symptoms such as hematuria or flank pain.

## 2023-09-07 ENCOUNTER — Ambulatory Visit: Payer: Self-pay | Admitting: Family Medicine

## 2023-09-16 ENCOUNTER — Ambulatory Visit

## 2023-11-11 ENCOUNTER — Ambulatory Visit

## 2023-11-11 DIAGNOSIS — Z0000 Encounter for general adult medical examination without abnormal findings: Secondary | ICD-10-CM | POA: Diagnosis not present

## 2023-11-11 NOTE — Progress Notes (Signed)
 Subjective:   Raoul Ciano is a 68 y.o. who presents for a Medicare Wellness preventive visit.  As a reminder, Annual Wellness Visits don't include a physical exam, and some assessments may be limited, especially if this visit is performed virtually. We may recommend an in-person follow-up visit with your provider if needed.  Visit Complete: Virtual I connected with  Tanda Slade on 11/11/23 by a audio enabled telemedicine application and verified that I am speaking with the correct person using two identifiers.  Patient Location: Home  Provider Location: Home Office  I discussed the limitations of evaluation and management by telemedicine. The patient expressed understanding and agreed to proceed.  Vital Signs: Because this visit was a virtual/telehealth visit, some criteria may be missing or patient reported. Any vitals not documented were not able to be obtained and vitals that have been documented are patient reported.  VideoDeclined- This patient declined Librarian, academic. Therefore the visit was completed with audio only.  Persons Participating in Visit: Patient.  AWV Questionnaire: No: Patient Medicare AWV questionnaire was not completed prior to this visit.  Cardiac Risk Factors include: advanced age (>61men, >31 women);dyslipidemia;male gender;hypertension     Objective:    There were no vitals filed for this visit. There is no height or weight on file to calculate BMI.     11/11/2023    3:33 PM 10/01/2021    7:43 AM 09/18/2020    8:33 AM 07/17/2020   11:20 PM 07/17/2020   11:18 PM 07/17/2020    6:40 PM 07/03/2016   10:12 AM  Advanced Directives  Does Patient Have a Medical Advance Directive? No Yes Yes Yes Yes Yes   Type of Special educational needs teacher of [place];Living will Living will  Healthcare Power of [employer] Power of [place];Living will Living will  Copy of Healthcare Power of Attorney in Chart?  No -  copy requested       Would patient like information on creating a medical advance directive? No - Patient declined          Current Medications (verified) Outpatient Encounter Medications as of 11/11/2023  Medication Sig   Nebivolol  HCl 20 MG TABS Take 1 tablet (20 mg total) by mouth daily.   rosuvastatin  (CRESTOR ) 20 MG tablet Take 1 tablet (20 mg total) by mouth daily.   tadalafil  (CIALIS ) 20 MG tablet Take 1 tablet (20 mg total) by mouth daily as needed for erectile dysfunction.   No facility-administered encounter medications on file as of 11/11/2023.    Allergies (verified) Patient has no known allergies.   History: Past Medical History:  Diagnosis Date   AKI (acute kidney injury) (HCC) 07/17/2020   ED (erectile dysfunction)    History of colon polyps    Hyperlipidemia    Hypertension    Insomnia    Past Surgical History:  Procedure Laterality Date   COLONOSCOPY WITH PROPOFOL  N/A 09/18/2020   Procedure: COLONOSCOPY WITH PROPOFOL ;  Surgeon: Unk Corinn Skiff, MD;  Location: [facility] ENDOSCOPY;  Service: Gastroenterology;  Laterality: N/A;   COLONOSCOPY WITH PROPOFOL  N/A 10/01/2021   Procedure: COLONOSCOPY WITH PROPOFOL ;  Surgeon: Unk Corinn Skiff, MD;  Location: [facility] ENDOSCOPY;  Service: Gastroenterology;  Laterality: N/A;   Family History  Problem Relation Age of Onset   Heart failure Mother    Hypertension Mother    Diabetes Mother    Heart disease Mother 40       CABG   Heart attack Mother 24  Obesity Sister    Drug abuse Brother    Social History   Socioeconomic History   Marital status: Married    Spouse name: Olena Ruth   Number of children: 2   Years of education: Not on file   Highest education level: 12th grade  Occupational History   Occupation: Location manager    Comment: [employer]  Tobacco Use   Smoking status: Former    Current packs/day: 0.00    Average packs/day: 0.5 packs/day for 10.0 years (5.0 ttl pk-yrs)    Types: Cigarettes     Start date: 67    Quit date: 1989    Years since quitting: 36.5   Smokeless tobacco: Never  Vaping Use   Vaping status: Never Used  Substance and Sexual Activity   Alcohol use: Yes    Alcohol/week: 8.0 standard drinks of alcohol    Types: 6 Cans of beer, 2 Shots of liquor per week   Drug use: No   Sexual activity: Yes    Partners: Female    Birth control/protection: None  Other Topics Concern   Not on file  Social History Narrative   Married    He has two grown son's and 3 grandchildren   Retired October 2020    Social Drivers of Corporate investment banker Strain: Low Risk  (11/11/2023)   Overall Financial Resource Strain (CARDIA)    Difficulty of Paying Living Expenses: Not hard at all  Food Insecurity: No Food Insecurity (11/11/2023)   Hunger Vital Sign    Worried About Running Out of Food in the Last Year: Never true    Ran Out of Food in the Last Year: Never true  Transportation Needs: No Transportation Needs (11/06/2022)   PRAPARE - Administrator, Civil Service (Medical): No    Lack of Transportation (Non-Medical): No  Physical Activity: Sufficiently Active (11/11/2023)   Exercise Vital Sign    Days of Exercise per Week: 4 days    Minutes of Exercise per Session: 60 min  Stress: No Stress Concern Present (11/11/2023)   [employer] of Occupational Health - Occupational Stress Questionnaire    Feeling of Stress: Not at all  Social Connections: Moderately Isolated (11/11/2023)   Social Connection and Isolation Panel    Frequency of Communication with Friends and Family: More than three times a week    Frequency of Social Gatherings with Friends and Family: More than three times a week    Attends Religious Services: Never    Database administrator or Organizations: No    Attends Engineer, structural: Never    Marital Status: Married    Tobacco Counseling Counseling given: Not Answered    Clinical Intake:  Pre-visit preparation  completed: Yes  Pain : No/denies pain     BMI - recorded: 24.5 Nutritional Status: BMI of 19-24  Normal Nutritional Risks: None Diabetes: No  Lab Results  Component Value Date   HGBA1C 5.8 (H) 09/06/2023   HGBA1C 5.8 (H) 07/03/2022   HGBA1C 5.7 (H) 06/30/2021     How often do you need to have someone help you when you read instructions, pamphlets, or other written materials from your doctor or pharmacy?: 1 - Never  Interpreter Needed?: No  Information entered by :: JHONNIE DAS, LPN   Activities of Daily Living     11/11/2023    3:34 PM 03/08/2023    7:43 AM  In your present state of health, do  you have any difficulty performing the following activities:  Hearing? 0 0  Vision? 0 0  Difficulty concentrating or making decisions? 0 0  Walking or climbing stairs? 0 0  Dressing or bathing? 0 0  Doing errands, shopping? 0 0  Preparing Food and eating ? N   Using the Toilet? N   In the past six months, have you accidently leaked urine? N   Do you have problems with loss of bowel control? N   Managing your Medications? N   Managing your Finances? N   Housekeeping or managing your Housekeeping? N     Patient Care Team: Sowles, Krichna, MD as PCP - [facility]  I have updated your Care Teams any recent Medical Services you may have received from other providers in the past year.     Assessment:   This is a routine wellness examination for Devaunte.  Hearing/Vision screen Hearing Screening - Comments:: NO AIDS  Vision Screening - Comments:: READERS- [place]    Goals Addressed             This Visit's Progress    DIET - EAT MORE FRUITS AND VEGETABLES         Depression Screen     11/11/2023    3:31 PM 09/06/2023    8:08 AM 03/08/2023    7:42 AM 11/06/2022    8:08 AM 09/03/2022    7:52 AM 07/03/2022    7:41 AM 03/05/2022    7:40 AM  PHQ 2/9 Scores  PHQ - 2 Score 0 0 0 0 0 0 0  PHQ- 9 Score 0  0 0 0 0 0    Fall  Risk     11/11/2023    3:34 PM 09/06/2023    8:08 AM 03/08/2023    7:42 AM 11/06/2022    8:07 AM 09/03/2022    7:52 AM  Fall Risk   Falls in the past year? 0 0 0 0 0  Number falls in past yr: 0 0 0 0 0  Injury with Fall? 0 0 0 0 0  Risk for fall due to : No Fall Risks No Fall Risks No Fall Risks No Fall Risks No Fall Risks  Follow up Falls evaluation completed;Falls prevention discussed Falls prevention discussed;Education provided;Falls evaluation completed Falls prevention discussed Falls prevention discussed Falls prevention discussed    MEDICARE RISK AT HOME:  Medicare Risk at Home Any stairs in or around the home?: Yes If so, are there any without handrails?: No Home free of loose throw rugs in walkways, pet beds, electrical cords, etc?: Yes Adequate lighting in your home to reduce risk of falls?: Yes Life alert?: No Use of a cane, walker or w/c?: No Grab bars in the bathroom?: No Shower chair or bench in shower?: No Elevated toilet seat or a handicapped toilet?: Yes  TIMED UP AND GO:  Was the test performed?  No  Cognitive Function: 6CIT completed        11/11/2023    3:35 PM  6CIT Screen  What Year? 0 points  What month? 0 points  What time? 0 points  Count back from 20 0 points  Months in reverse 0 points  Repeat phrase 0 points  Total Score 0 points    Immunizations Immunization History  Administered Date(s) Administered   Fluad Trivalent(High Dose 65+) 03/08/2023   Influenza,inj,Quad PF,6+ Mos 12/10/2017, 01/31/2019, 01/24/2020, 03/04/2021, 03/05/2022   Influenza-Unspecified 02/14/2016, 02/17/2017   PFIZER(Purple Top)SARS-COV-2  Vaccination 07/24/2019, 08/23/2019   PNEUMOCOCCAL CONJUGATE-20 11/06/2022   Pfizer Covid-19 Vaccine Bivalent Booster 63yrs & up 02/10/2021   Tdap 09/11/2010, 07/25/2020   Zoster Recombinant(Shingrix ) 12/10/2017, 07/25/2020    Screening Tests Health Maintenance  Topic Date Due   COVID-19 Vaccine (4 - 2024-25 season)  12/20/2022   INFLUENZA VACCINE  11/19/2023   Medicare Annual Wellness (AWV)  11/10/2024   DTaP/Tdap/Td (3 - Td or Tdap) 07/26/2030   Colonoscopy  10/02/2031   Pneumococcal Vaccine: 50+ Years  Completed   Hepatitis C Screening  Completed   Zoster Vaccines- Shingrix   Completed   Hepatitis B Vaccines  Aged Out   HPV VACCINES  Aged Out   Meningococcal B Vaccine  Aged Out    Health Maintenance  Health Maintenance Due  Topic Date Due   COVID-19 Vaccine (4 - 2024-25 season) 12/20/2022   Health Maintenance Items Addressed: UP TO DATE ON COLONOSCOPY;UP TO DATE ON SHOTS EXCEPT COVID  Additional Screening:  Vision Screening: Recommended annual ophthalmology exams for early detection of glaucoma and other disorders of the eye. Would you like a referral to an eye doctor? No    Dental Screening: Recommended annual dental exams for proper oral hygiene  Community Resource Referral / Chronic Care Management: CRR required this visit?  No   CCM required this visit?  No   Plan:    I have personally reviewed and noted the following in the patient's chart:   Medical and social history Use of alcohol, tobacco or illicit drugs  Current medications and supplements including opioid prescriptions. Patient is not currently taking opioid prescriptions. Functional ability and status Nutritional status Physical activity Advanced directives List of other physicians Hospitalizations, surgeries, and ER visits in previous 12 months Vitals Screenings to include cognitive, depression, and falls Referrals and appointments  In addition, I have reviewed and discussed with patient certain preventive protocols, quality metrics, and best practice recommendations. A written personalized care plan for preventive services as well as general preventive health recommendations were provided to patient.   Jhonnie GORMAN Das, LPN   [id number]/[id number]   After Visit Summary: (MyChart) Due to this being a telephonic visit,  the after visit summary with patients personalized plan was offered to patient via MyChart   Notes: Nothing significant to report at this time.

## 2023-11-11 NOTE — Patient Instructions (Signed)
 Mr. Artman , Thank you for taking time out of your busy schedule to complete your Annual Wellness Visit with me. I enjoyed our conversation and look forward to speaking with you again next year. I, as well as your care team,  appreciate your ongoing commitment to your health goals. Please review the following plan we discussed and let me know if I can assist you in the future.   Follow up Visits: Next Medicare AWV with our clinical staff:   11/16/24 @ 2:00 PM BY PHONE Have you seen your provider in the last 6 months (3 months if uncontrolled diabetes)? Yes  Clinician Recommendations:  Aim for 30 minutes of exercise or brisk walking, 6-8 glasses of water, and 5 servings of fruits and vegetables each day. TAKE CARE!      This is a list of the screening recommended for you and due dates:  Health Maintenance  Topic Date Due   COVID-19 Vaccine (4 - 2024-25 season) 12/20/2022   Flu Shot  11/19/2023   Medicare Annual Wellness Visit  11/10/2024   DTaP/Tdap/Td vaccine (3 - Td or Tdap) 07/26/2030   Colon Cancer Screening  10/02/2031   Pneumococcal Vaccine for age over 49  Completed   Hepatitis C Screening  Completed   Zoster (Shingles) Vaccine  Completed   Hepatitis B Vaccine  Aged Out   HPV Vaccine  Aged Out   Meningitis B Vaccine  Aged Out    Advanced directives: (ACP Link)Information on Advanced Care Planning can be found at [place]  Best boy Advance Health Care Directives Advance Health Care Directives. [email or web address]  Advance Care Planning is important because it:  [x]  Makes sure you receive the medical care that is consistent with your values, goals, and preferences  [x]  It provides guidance to your family and loved ones and reduces their decisional burden about whether or not they are making the right decisions based on your wishes.  Follow the link provided in your after visit summary or read over the paperwork we have mailed to you to help you started getting your  Advance Directives in place. If you need assistance in completing these, please reach out to us  so that we can help you!

## 2024-01-10 NOTE — Patient Instructions (Signed)
 Preventive Care 73 Years and Older, Male Preventive care refers to lifestyle choices and visits with your health care provider that can promote health and wellness. Preventive care visits are also called wellness exams. What can I expect for my preventive care visit? Counseling During your preventive care visit, your health care provider may ask about your: Medical history, including: Past medical problems. Family medical history. History of falls. Current health, including: Emotional well-being. Home life and relationship well-being. Sexual activity. Memory and ability to understand (cognition). Lifestyle, including: Alcohol, nicotine or tobacco, and drug use. Access to firearms. Diet, exercise, and sleep habits. Work and work Astronomer. Sunscreen use. Safety issues such as seatbelt and bike helmet use. Physical exam Your health care provider will check your: Height and weight. These may be used to calculate your BMI (body mass index). BMI is a measurement that tells if you are at a healthy weight. Waist circumference. This measures the distance around your waistline. This measurement also tells if you are at a healthy weight and may help predict your risk of certain diseases, such as type 2 diabetes and high blood pressure. Heart rate and blood pressure. Body temperature. Skin for abnormal spots. What immunizations do I need?  Vaccines are usually given at various ages, according to a schedule. Your health care provider will recommend vaccines for you based on your age, medical history, and lifestyle or other factors, such as travel or where you work. What tests do I need? Screening Your health care provider may recommend screening tests for certain conditions. This may include: Lipid and cholesterol levels. Diabetes screening. This is done by checking your blood sugar (glucose) after you have not eaten for a while (fasting). Hepatitis C test. Hepatitis B test. HIV (human  immunodeficiency virus) test. STI (sexually transmitted infection) testing, if you are at risk. Lung cancer screening. Colorectal cancer screening. Prostate cancer screening. Abdominal aortic aneurysm (AAA) screening. You may need this if you are a current or former smoker. Talk with your health care provider about your test results, treatment options, and if necessary, the need for more tests. Follow these instructions at home: Eating and drinking  Eat a diet that includes fresh fruits and vegetables, whole grains, lean protein, and low-fat dairy products. Limit your intake of foods with high amounts of sugar, saturated fats, and salt. Take vitamin and mineral supplements as recommended by your health care provider. Do not drink alcohol if your health care provider tells you not to drink. If you drink alcohol: Limit how much you have to 0-2 drinks a day. Know how much alcohol is in your drink. In the [place] one drink equals one 12 oz bottle of beer (355 mL), one 5 oz glass of wine (148 mL), or one 1 oz glass of hard liquor (44 mL). Lifestyle Brush your teeth every morning and night with fluoride toothpaste. Floss one time each day. Exercise for at least 30 minutes 5 or more days each week. Do not use any products that contain nicotine or tobacco. These products include cigarettes, chewing tobacco, and vaping devices, such as e-cigarettes. If you need help quitting, ask your health care provider. Do not use drugs. If you are sexually active, practice safe sex. Use a condom or other form of protection to prevent STIs. Take aspirin only as told by your health care provider. Make sure that you understand how much to take and what form to take. Work with your health care provider to find out whether it is safe  and beneficial for you to take aspirin daily. Ask your health care provider if you need to take a cholesterol-lowering medicine (statin). Find healthy ways to manage stress, such  as: Meditation, yoga, or listening to music. Journaling. Talking to a trusted person. Spending time with friends and family. Safety Always wear your seat belt while driving or riding in a vehicle. Do not drive: If you have been drinking alcohol. Do not ride with someone who has been drinking. When you are tired or distracted. While texting. If you have been using any mind-altering substances or drugs. Wear a helmet and other protective equipment during sports activities. If you have firearms in your house, make sure you follow all gun safety procedures. Minimize exposure to UV radiation to reduce your risk of skin cancer. What's next? Visit your health care provider once a year for an annual wellness visit. Ask your health care provider how often you should have your eyes and teeth checked. Stay up to date on all vaccines. This information is not intended to replace advice given to you by your health care provider. Make sure you discuss any questions you have with your health care provider. Document Revised: 10/02/2020 Document Reviewed: 10/02/2020 Elsevier Patient Education  2024 [employer].

## 2024-01-11 ENCOUNTER — Ambulatory Visit (INDEPENDENT_AMBULATORY_CARE_PROVIDER_SITE_OTHER): Admitting: Family Medicine

## 2024-01-11 ENCOUNTER — Encounter: Payer: Self-pay | Admitting: Family Medicine

## 2024-01-11 VITALS — BP 136/82 | HR 61 | Resp 16 | Ht 71.25 in | Wt 176.6 lb

## 2024-01-11 DIAGNOSIS — Z23 Encounter for immunization: Secondary | ICD-10-CM | POA: Diagnosis not present

## 2024-01-11 DIAGNOSIS — Z0000 Encounter for general adult medical examination without abnormal findings: Secondary | ICD-10-CM

## 2024-01-11 NOTE — Progress Notes (Signed)
 Name: Shawna Kiener   MRN: [id number]    DOB: [birth year]   Date:01/11/2024       Progress Note  Subjective  Chief Complaint  Chief Complaint  Patient presents with   Annual Exam    HPI  Patient presents for annual CPE .   IPSS     Row Name 01/11/24 0825         International Prostate Symptom Score   How often have you had the sensation of not emptying your bladder? Not at All     How often have you had to urinate less than every two hours? Not at All     How often have you found you stopped and started again several times when you urinated? Not at All     How often have you found it difficult to postpone urination? Not at All     How often have you had a weak urinary stream? Less than half the time     How often have you had to strain to start urination? Not at All     How many times did you typically get up at night to urinate? 2 Times     Total IPSS Score 4       Quality of Life due to urinary symptoms   If you were to spend the rest of your life with your urinary condition just the way it is now how would you feel about that? Pleased        Diet: he has been skipping meals, he lost some weight since last year, advised to monitor weight and return sooner if needed.  Exercise: continue regular activity Last Dental Exam: up to date  Last Eye Exam:  up to date   Depression: phq 9 is negative    01/11/2024    8:10 AM 11/11/2023    3:31 PM 09/06/2023    8:08 AM 03/08/2023    7:42 AM 11/06/2022    8:08 AM  Depression screen PHQ 2/9  Decreased Interest 0 0 0 0 0  Down, Depressed, Hopeless 0 0 0 0 0  PHQ - 2 Score 0 0 0 0 0  Altered sleeping  0  0 0  Tired, decreased energy  0  0 0  Change in appetite  0  0 0  Feeling bad or failure about yourself   0  0 0  Trouble concentrating  0  0 0  Moving slowly or fidgety/restless  0  0 0  Suicidal thoughts  0  0 0  PHQ-9 Score  0  0 0  Difficult doing work/chores  Not difficult at all       Hypertension:  BP Readings  from Last 3 Encounters:  01/11/24 136/82  09/06/23 128/80  03/08/23 132/82    Obesity: Wt Readings from Last 3 Encounters:  01/11/24 176 lb 9.6 oz (80.1 kg)  09/06/23 181 lb 12.8 oz (82.5 kg)  03/08/23 187 lb (84.8 kg)   BMI Readings from Last 3 Encounters:  01/11/24 24.46 kg/m  09/06/23 24.66 kg/m  03/08/23 25.36 kg/m     Flowsheet Row Clinical Support from 11/11/2023 in [facility]  AUDIT-C Score 3    Married STD testing and prevention (HIV/chl/gon/syphilis):  no  Sexual history: no problems Hep C Screening: completed Skin cancer: Discussed monitoring for atypical lesions Colorectal cancer: up to date  Prostate cancer:  no Lab Results  Component Value Date   PSA 0.90 06/30/2021   PSA  0.7 06/08/2016     Lung cancer:  Low Dose CT Chest recommended if Age 66-80 years, 30 pack-year currently smoking OR have quit w/in 15years. Patient  is not a candidate for screening   AAA: MRI negative in 2022 ECG:  2024  Vaccines: reviewed with the patient. Flu shot today   Advanced Care Planning: A voluntary discussion about advance care planning including the explanation and discussion of advance directives.  Discussed health care proxy and Living will, and the patient was able to identify a health care proxy as wife .  Patient does have a living will and power of attorney of health care   Patient Active Problem List   Diagnosis Date Noted   Bradycardia by electrocardiogram 11/06/2022   History of colonic polyps    Cyst of right kidney 09/02/2021   Atherosclerosis of abdominal aorta 07/25/2020   Diverticulosis 07/25/2020   Essential hypertension 09/02/2016   Eczema 06/08/2016   Hypercholesterolemia 04/08/2016   ED (erectile dysfunction)     Past Surgical History:  Procedure Laterality Date   COLONOSCOPY WITH PROPOFOL  N/A 09/18/2020   Procedure: COLONOSCOPY WITH PROPOFOL ;  Surgeon: Unk Corinn Skiff, MD;  Location: [facility] ENDOSCOPY;  Service:  Gastroenterology;  Laterality: N/A;   COLONOSCOPY WITH PROPOFOL  N/A 10/01/2021   Procedure: COLONOSCOPY WITH PROPOFOL ;  Surgeon: Unk Corinn Skiff, MD;  Location: [facility] ENDOSCOPY;  Service: Gastroenterology;  Laterality: N/A;    Family History  Problem Relation Age of Onset   Heart failure Mother    Hypertension Mother    Diabetes Mother    Heart disease Mother 54       CABG   Heart attack Mother 21   Obesity Sister    Drug abuse Brother     Social History   Socioeconomic History   Marital status: Married    Spouse name: Olena Ruth   Number of children: 2   Years of education: Not on file   Highest education level: 12th grade  Occupational History   Occupation: Location manager    Comment: [employer]  Tobacco Use   Smoking status: Former    Current packs/day: 0.00    Average packs/day: 0.5 packs/day for 10.0 years (5.0 ttl pk-yrs)    Types: Cigarettes    Start date: 64    Quit date: 1989    Years since quitting: 36.7   Smokeless tobacco: Never  Vaping Use   Vaping status: Never Used  Substance and Sexual Activity   Alcohol use: Yes    Alcohol/week: 8.0 standard drinks of alcohol    Types: 6 Cans of beer, 2 Shots of liquor per week   Drug use: No   Sexual activity: Yes    Partners: Female    Birth control/protection: None  Other Topics Concern   Not on file  Social History Narrative   Married    He has two grown son's and 3 grandchildren   Retired October 2020    Social Drivers of Corporate investment banker Strain: Low Risk  (11/11/2023)   Overall Financial Resource Strain (CARDIA)    Difficulty of Paying Living Expenses: Not hard at all  Food Insecurity: No Food Insecurity (11/11/2023)   Hunger Vital Sign    Worried About Running Out of Food in the Last Year: Never true    Ran Out of Food in the Last Year: Never true  Transportation Needs: No Transportation Needs (11/06/2022)   PRAPARE - Administrator, Civil Service (Medical):  No     Lack of Transportation (Non-Medical): No  Physical Activity: Sufficiently Active (11/11/2023)   Exercise Vital Sign    Days of Exercise per Week: 4 days    Minutes of Exercise per Session: 60 min  Stress: No Stress Concern Present (11/11/2023)   [employer] of Occupational Health - Occupational Stress Questionnaire    Feeling of Stress: Not at all  Social Connections: Moderately Isolated (11/11/2023)   Social Connection and Isolation Panel    Frequency of Communication with Friends and Family: More than three times a week    Frequency of Social Gatherings with Friends and Family: More than three times a week    Attends Religious Services: Never    Database administrator or Organizations: No    Attends Banker Meetings: Never    Marital Status: Married  Catering manager Violence: Not At Risk (11/11/2023)   Humiliation, Afraid, Rape, and Kick questionnaire    Fear of Current or Ex-Partner: No    Emotionally Abused: No    Physically Abused: No    Sexually Abused: No     Current Outpatient Medications:    Nebivolol  HCl 20 MG TABS, Take 1 tablet (20 mg total) by mouth daily., Disp: 90 tablet, Rfl: 1   rosuvastatin  (CRESTOR ) 20 MG tablet, Take 1 tablet (20 mg total) by mouth daily., Disp: 90 tablet, Rfl: 1   tadalafil  (CIALIS ) 20 MG tablet, Take 1 tablet (20 mg total) by mouth daily as needed for erectile dysfunction., Disp: 30 tablet, Rfl: 1  No Known Allergies   ROS  Constitutional: Negative for fever , positive for mild weight loss Respiratory: Negative for cough and shortness of breath.   Cardiovascular: Negative for chest pain or palpitations.  Gastrointestinal: Negative for abdominal pain, no bowel changes.  Musculoskeletal: Negative for gait problem or joint swelling.  Skin: Negative for rash.  Neurological: Negative for dizziness or headache.  No other specific complaints in a complete review of systems (except as listed in HPI above).     Objective  Vitals:   01/11/24 0827  BP: 136/82  Pulse: 61  Resp: 16  SpO2: 97%  Weight: 176 lb 9.6 oz (80.1 kg)  Height: 5' 11.25 (1.81 m)    Body mass index is 24.46 kg/m.  Physical Exam  Constitutional: Patient appears well-developed and well-nourished. No distress.  HENT: Head: Normocephalic and atraumatic. Ears: B TMs ok, no erythema or effusion; Nose: Nose normal. Mouth/Throat: Oropharynx is clear and moist. No oropharyngeal exudate.  Eyes: Conjunctivae and EOM are normal. Pupils are equal, round, and reactive to light. No scleral icterus.  Neck: Normal range of motion. Neck supple. No JVD present. No thyromegaly present.  Cardiovascular: Normal rate, regular rhythm and normal heart sounds.  No murmur heard. No BLE edema. Pulmonary/Chest: Effort normal and breath sounds normal. No respiratory distress. Abdominal: Soft. Bowel sounds are normal, no distension. There is no tenderness. no masses MALE GENITALIA: Normal descended testes bilaterally, no masses palpated, no hernias, no lesions, no discharge RECTAL: not done  Musculoskeletal: Normal range of motion, no joint effusions. No gross deformities Neurological: he is alert and oriented to person, place, and time. No cranial nerve deficit. Coordination, balance, strength, speech and gait are normal.  Skin: Skin is warm and dry. No rash noted. No erythema.  Psychiatric: Patient has a normal mood and affect. behavior is normal. Judgment and thought content normal.     Assessment & Plan  1. Well adult exam (Primary)  Monitor weight Continue regular physical activity Monitor alcohol intake  2. Need for influenza vaccination  - Flu vaccine HIGH DOSE PF(Fluzone Trivalent)    -Prostate cancer screening and PSA options (with potential risks and benefits of testing vs not testing) were discussed along with recent recs/guidelines. -USPSTF grade A and B recommendations reviewed with patient; age-appropriate  recommendations, preventive care, screening tests, etc discussed and encouraged; healthy living encouraged; see AVS for patient education given to patient -Discussed importance of 150 minutes of physical activity weekly, eat two servings of fish weekly, eat one serving of tree nuts ( cashews, pistachios, pecans, almonds.SABRA) every other day, eat 6 servings of fruit/vegetables daily and drink plenty of water and avoid sweet beverages.  -Reviewed Health Maintenance: yes

## 2024-03-08 ENCOUNTER — Ambulatory Visit (INDEPENDENT_AMBULATORY_CARE_PROVIDER_SITE_OTHER): Admitting: Family Medicine

## 2024-03-08 ENCOUNTER — Encounter: Payer: Self-pay | Admitting: Family Medicine

## 2024-03-08 VITALS — BP 140/90 | HR 60 | Resp 16 | Ht 71.25 in | Wt 180.4 lb

## 2024-03-08 DIAGNOSIS — I10 Essential (primary) hypertension: Secondary | ICD-10-CM | POA: Diagnosis not present

## 2024-03-08 DIAGNOSIS — R7303 Prediabetes: Secondary | ICD-10-CM

## 2024-03-08 DIAGNOSIS — N528 Other male erectile dysfunction: Secondary | ICD-10-CM

## 2024-03-08 DIAGNOSIS — N281 Cyst of kidney, acquired: Secondary | ICD-10-CM

## 2024-03-08 DIAGNOSIS — R739 Hyperglycemia, unspecified: Secondary | ICD-10-CM

## 2024-03-08 DIAGNOSIS — I700 Atherosclerosis of aorta: Secondary | ICD-10-CM

## 2024-03-08 DIAGNOSIS — E7800 Pure hypercholesterolemia, unspecified: Secondary | ICD-10-CM

## 2024-03-08 DIAGNOSIS — I471 Supraventricular tachycardia, unspecified: Secondary | ICD-10-CM

## 2024-03-08 MED ORDER — NEBIVOLOL HCL 20 MG PO TABS: 1.0000 | ORAL_TABLET | Freq: Every day | ORAL | 1 refills | Status: AC

## 2024-03-08 MED ORDER — TADALAFIL 20 MG PO TABS: 20.0000 mg | ORAL_TABLET | Freq: Every day | ORAL | 1 refills | Status: AC | PRN

## 2024-03-08 MED ORDER — VALSARTAN 160 MG PO TABS: 160.0000 mg | ORAL_TABLET | Freq: Every day | ORAL | 1 refills | Status: AC

## 2024-03-08 MED ORDER — ROSUVASTATIN CALCIUM 20 MG PO TABS: 20.0000 mg | ORAL_TABLET | Freq: Every day | ORAL | 1 refills | Status: AC

## 2024-03-08 NOTE — Progress Notes (Signed)
 Name: Charles Valentine   MRN: [id number]    DOB: 04/21/1955   Date:03/08/2024       Progress Note  Subjective  Chief Complaint  Chief Complaint  Patient presents with   Medical Management of Chronic Issues   Discussed the use of AI scribe software for clinical note transcription with the patient, who gave verbal consent to proceed.  History of Present Illness Charles Valentine is a 68 year old male who presents for a follow-up visit.  He feels groggy today, which he attributes to weather changes. His wife and granddaughter have been sick with a cold or viral illness, exhibiting symptoms like cough and rhinorrhea. He has been taking precautions by sleeping in a separate room from his wife.  He has a history of prediabetes with A1c levels consistently in the prediabetes range, recorded as 5.7%, 5.8%, and 5.8% over the past three years. He is actively managing his condition by reducing starches and limiting sweets, despite cravings. He eats less overall and avoids sugary drinks, choosing diet green tea and occasionally ginger ale instead.  He is on medication for hypertension, taking nebivolol  20mg  but bp at home has been in the 130's range and today is over 140/90,   and reports no chest pain or palpitations. He has seen cardiologist and has a history of SVT , he took amlodipine  10 mg and valsartan  in the past without side effects, but medications was changed due to SVT   He is also on rosuvastatin  for cholesterol management and reports no issues with this medication.   ED is controlled with Cialis  prn   Patient Active Problem List   Diagnosis Date Noted   Bradycardia by electrocardiogram 11/06/2022   History of colonic polyps    Cyst of right kidney 09/02/2021   Atherosclerosis of abdominal aorta 07/25/2020   Diverticulosis 07/25/2020   Essential hypertension 09/02/2016   Eczema 06/08/2016   Hypercholesterolemia 04/08/2016   ED (erectile dysfunction)     Past Surgical History:   Procedure Laterality Date   COLONOSCOPY WITH PROPOFOL  N/A 09/18/2020   Procedure: COLONOSCOPY WITH PROPOFOL ;  Surgeon: Unk Corinn Skiff, MD;  Location[facility] ENDOSCOPY;  Service: Gastroenterology;  Laterality: N/A;   COLONOSCOPY WITH PROPOFOL  N/A 10/01/2021   Procedure: COLONOSCOPY WITH PROPOFOL ;  Surgeon: Unk Corinn Skiff, MD;  Location: [facility] ENDOSCOPY;  Service: Gastroenterology;  Laterality: N/A;    Family History  Problem Relation Age of Onset   Heart failure Mother    Hypertension Mother    Diabetes Mother    Heart disease Mother 18       CABG   Heart attack Mother 5   Obesity Sister    Drug abuse Brother     Social History   Tobacco Use   Smoking status: Former    Current packs/day: 0.00    Average packs/day: 0.5 packs/day for 10.0 years (5.0 ttl pk-yrs)    Types: Cigarettes    Start date: 66    Quit date: 1989    Years since quitting: 36.9   Smokeless tobacco: Never  Substance Use Topics   Alcohol use: Yes    Alcohol/week: 8.0 standard drinks of alcohol    Types: 6 Cans of beer, 2 Shots of liquor per week     Current Outpatient Medications:    valsartan  (DIOVAN ) 160 MG tablet, Take 1 tablet (160 mg total) by mouth daily., Disp: 90 tablet, Rfl: 1   Nebivolol  HCl 20 MG TABS, Take 1 tablet (20 mg total) by mouth daily.,  Disp: 90 tablet, Rfl: 1   rosuvastatin  (CRESTOR ) 20 MG tablet, Take 1 tablet (20 mg total) by mouth daily., Disp: 90 tablet, Rfl: 1   tadalafil  (CIALIS ) 20 MG tablet, Take 1 tablet (20 mg total) by mouth daily as needed for erectile dysfunction., Disp: 30 tablet, Rfl: 1  No Known Allergies  I personally reviewed active problem list, medication list, allergies, family history with the patient/caregiver today.   ROS  Ten systems reviewed and is negative except as mentioned in HPI    Objective Physical Exam CONSTITUTIONAL: Patient appears well-developed and well-nourished.  No distress. HEENT: Head atraumatic, normocephalic, neck  supple. CARDIOVASCULAR: Normal rate, regular rhythm and normal heart sounds.  No murmur heard. No BLE edema. PULMONARY: Effort normal and breath sounds normal. No respiratory distress. ABDOMINAL: There is no tenderness or distention. MUSCULOSKELETAL: Normal gait. Without gross motor or sensory deficit. PSYCHIATRIC: Patient has a normal mood and affect. behavior is normal. Judgment and thought content normal.  Vitals:   03/08/24 0809 03/08/24 0833  BP: (!) 148/90 (!) 140/90  Pulse: 60   Resp: 16   SpO2: 97%   Weight: 180 lb 6.4 oz (81.8 kg)   Height: 5' 11.25 (1.81 m)     Body mass index is 24.98 kg/m.   PHQ2/9:    03/08/2024    8:07 AM 01/11/2024    8:10 AM 11/11/2023    3:31 PM 09/06/2023    8:08 AM 03/08/2023    7:42 AM  Depression screen PHQ 2/9  Decreased Interest 0 0 0 0 0  Down, Depressed, Hopeless 0 0 0 0 0  PHQ - 2 Score 0 0 0 0 0  Altered sleeping   0  0  Tired, decreased energy   0  0  Change in appetite   0  0  Feeling bad or failure about yourself    0  0  Trouble concentrating   0  0  Moving slowly or fidgety/restless   0  0  Suicidal thoughts   0  0  PHQ-9 Score   0   0   Difficult doing work/chores   Not difficult at all       Data saved with a previous flowsheet row definition    phq 9 is negative  Fall Risk:    03/08/2024    8:07 AM 01/11/2024    8:10 AM 11/11/2023    3:34 PM 09/06/2023    8:08 AM 03/08/2023    7:42 AM  Fall Risk   Falls in the past year? 0 0 0 0 0  Number falls in past yr: 0 0 0 0 0  Injury with Fall? 0 0 0 0 0  Risk for fall due to : No Fall Risks No Fall Risks No Fall Risks No Fall Risks No Fall Risks  Follow up Falls evaluation completed Falls evaluation completed Falls evaluation completed;Falls prevention discussed Falls prevention discussed;Education provided;Falls evaluation completed Falls prevention discussed      Assessment & Plan Prediabetes A1c levels consistently above goal at 5.7, 5.8, and 5.8 over three  years, indicating prediabetes. Actively managing through dietary modifications. - Continue dietary modifications to manage blood sugar levels.  Essential hypertension  No chest pain or palpitations reported. - Continue Nebivilol 20 mg  - Add Valsartan  160 mg  - Scheduled follow-up in two weeks for blood pressure check.  Pure hypercholesterolemia/atherosclerosis of Aorta Cholesterol management ongoing with rosuvastatin . - Continue rosuvastatin  for cholesterol management.  ED - sending Cialis   20 mg to [facility]  Acquired renal cyst Advised monitoring for symptoms.Incidental finding and no need for follow up based on Bosniak socre - Monitor for symptoms such as hematuria.  General Health Maintenance Received flu shot during recent physical examination. - Continue routine health maintenance and vaccinations as recommended.

## 2024-11-16 ENCOUNTER — Ambulatory Visit

## 2025-01-11 ENCOUNTER — Encounter: Admitting: Family Medicine
